# Patient Record
Sex: Female | Born: 1950 | Race: White | Hispanic: No | Marital: Married | Smoking: Former smoker
Health system: Southern US, Community
[De-identification: ages and names within clinical notes are randomized; demographics above are authoritative.]

## PROBLEM LIST (undated history)

## (undated) DIAGNOSIS — H539 Unspecified visual disturbance: Secondary | ICD-10-CM

## (undated) DIAGNOSIS — G35D Multiple sclerosis, unspecified: Secondary | ICD-10-CM

## (undated) DIAGNOSIS — E785 Hyperlipidemia, unspecified: Secondary | ICD-10-CM

## (undated) DIAGNOSIS — E039 Hypothyroidism, unspecified: Secondary | ICD-10-CM

## (undated) DIAGNOSIS — N200 Calculus of kidney: Secondary | ICD-10-CM

## (undated) DIAGNOSIS — Z9889 Other specified postprocedural states: Secondary | ICD-10-CM

## (undated) DIAGNOSIS — Z87442 Personal history of urinary calculi: Secondary | ICD-10-CM

## (undated) DIAGNOSIS — N189 Chronic kidney disease, unspecified: Secondary | ICD-10-CM

## (undated) DIAGNOSIS — G35 Multiple sclerosis: Secondary | ICD-10-CM

## (undated) DIAGNOSIS — E079 Disorder of thyroid, unspecified: Secondary | ICD-10-CM

## (undated) DIAGNOSIS — E119 Type 2 diabetes mellitus without complications: Secondary | ICD-10-CM

## (undated) DIAGNOSIS — K219 Gastro-esophageal reflux disease without esophagitis: Secondary | ICD-10-CM

## (undated) DIAGNOSIS — F32A Depression, unspecified: Secondary | ICD-10-CM

## (undated) DIAGNOSIS — L309 Dermatitis, unspecified: Secondary | ICD-10-CM

## (undated) DIAGNOSIS — K829 Disease of gallbladder, unspecified: Secondary | ICD-10-CM

## (undated) DIAGNOSIS — G43909 Migraine, unspecified, not intractable, without status migrainosus: Secondary | ICD-10-CM

## (undated) DIAGNOSIS — R600 Localized edema: Secondary | ICD-10-CM

## (undated) DIAGNOSIS — G2581 Restless legs syndrome: Secondary | ICD-10-CM

## (undated) DIAGNOSIS — R0683 Snoring: Secondary | ICD-10-CM

## (undated) DIAGNOSIS — R2681 Unsteadiness on feet: Secondary | ICD-10-CM

## (undated) DIAGNOSIS — F419 Anxiety disorder, unspecified: Secondary | ICD-10-CM

## (undated) DIAGNOSIS — M1990 Unspecified osteoarthritis, unspecified site: Secondary | ICD-10-CM

## (undated) DIAGNOSIS — R42 Dizziness and giddiness: Secondary | ICD-10-CM

## (undated) DIAGNOSIS — E7800 Pure hypercholesterolemia, unspecified: Secondary | ICD-10-CM

## (undated) DIAGNOSIS — M5431 Sciatica, right side: Secondary | ICD-10-CM

## (undated) DIAGNOSIS — M2550 Pain in unspecified joint: Secondary | ICD-10-CM

## (undated) DIAGNOSIS — J45909 Unspecified asthma, uncomplicated: Secondary | ICD-10-CM

## (undated) DIAGNOSIS — R002 Palpitations: Secondary | ICD-10-CM

## (undated) DIAGNOSIS — F329 Major depressive disorder, single episode, unspecified: Secondary | ICD-10-CM

## (undated) DIAGNOSIS — E669 Obesity, unspecified: Secondary | ICD-10-CM

## (undated) DIAGNOSIS — Z91018 Allergy to other foods: Secondary | ICD-10-CM

## (undated) DIAGNOSIS — T7840XA Allergy, unspecified, initial encounter: Secondary | ICD-10-CM

## (undated) DIAGNOSIS — R0600 Dyspnea, unspecified: Secondary | ICD-10-CM

## (undated) DIAGNOSIS — R112 Nausea with vomiting, unspecified: Secondary | ICD-10-CM

## (undated) DIAGNOSIS — M549 Dorsalgia, unspecified: Secondary | ICD-10-CM

## (undated) HISTORY — PX: NISSEN FUNDOPLICATION: SHX2091

## (undated) HISTORY — DX: Chronic kidney disease, unspecified: N18.9

## (undated) HISTORY — DX: Multiple sclerosis, unspecified: G35.D

## (undated) HISTORY — DX: Type 2 diabetes mellitus without complications: E11.9

## (undated) HISTORY — DX: Unspecified asthma, uncomplicated: J45.909

## (undated) HISTORY — DX: Calculus of kidney: N20.0

## (undated) HISTORY — DX: Pure hypercholesterolemia, unspecified: E78.00

## (undated) HISTORY — DX: Allergy to other foods: Z91.018

## (undated) HISTORY — DX: Dyspnea, unspecified: R06.00

## (undated) HISTORY — PX: ESOPHAGOGASTRODUODENOSCOPY: SHX1529

## (undated) HISTORY — DX: Obesity, unspecified: E66.9

## (undated) HISTORY — DX: Depression, unspecified: F32.A

## (undated) HISTORY — DX: Disease of gallbladder, unspecified: K82.9

## (undated) HISTORY — DX: Dorsalgia, unspecified: M54.9

## (undated) HISTORY — PX: BREAST SURGERY: SHX581

## (undated) HISTORY — PX: LITHOTRIPSY: SUR834

## (undated) HISTORY — DX: Localized edema: R60.0

## (undated) HISTORY — PX: OTHER SURGICAL HISTORY: SHX169

## (undated) HISTORY — DX: Major depressive disorder, single episode, unspecified: F32.9

## (undated) HISTORY — DX: Pain in unspecified joint: M25.50

## (undated) HISTORY — PX: FINGER SURGERY: SHX640

## (undated) HISTORY — DX: Dermatitis, unspecified: L30.9

## (undated) HISTORY — DX: Hyperlipidemia, unspecified: E78.5

## (undated) HISTORY — DX: Disorder of thyroid, unspecified: E07.9

## (undated) HISTORY — DX: Allergy, unspecified, initial encounter: T78.40XA

## (undated) HISTORY — DX: Restless legs syndrome: G25.81

## (undated) HISTORY — DX: Gastro-esophageal reflux disease without esophagitis: K21.9

## (undated) HISTORY — PX: HERNIA REPAIR: SHX51

## (undated) HISTORY — DX: Unspecified visual disturbance: H53.9

## (undated) HISTORY — DX: Migraine, unspecified, not intractable, without status migrainosus: G43.909

## (undated) HISTORY — DX: Anxiety disorder, unspecified: F41.9

## (undated) HISTORY — PX: CHOLECYSTECTOMY: SHX55

## (undated) HISTORY — DX: Sciatica, right side: M54.31

## (undated) HISTORY — DX: Multiple sclerosis: G35

---

## 1981-10-11 HISTORY — PX: DILATION AND CURETTAGE OF UTERUS: SHX78

## 1998-07-02 ENCOUNTER — Ambulatory Visit (HOSPITAL_COMMUNITY): Admission: RE | Admit: 1998-07-02 | Discharge: 1998-07-02 | Payer: Self-pay

## 1998-07-07 ENCOUNTER — Encounter (HOSPITAL_COMMUNITY): Admission: RE | Admit: 1998-07-07 | Discharge: 1998-10-05 | Payer: Self-pay

## 2000-09-23 ENCOUNTER — Encounter (INDEPENDENT_AMBULATORY_CARE_PROVIDER_SITE_OTHER): Payer: Self-pay | Admitting: Specialist

## 2000-09-23 ENCOUNTER — Other Ambulatory Visit: Admission: RE | Admit: 2000-09-23 | Discharge: 2000-09-23 | Payer: Self-pay | Admitting: Gynecology

## 2002-07-17 ENCOUNTER — Ambulatory Visit (HOSPITAL_COMMUNITY): Admission: RE | Admit: 2002-07-17 | Discharge: 2002-07-17 | Payer: Self-pay | Admitting: Gastroenterology

## 2002-11-26 ENCOUNTER — Other Ambulatory Visit: Admission: RE | Admit: 2002-11-26 | Discharge: 2002-11-26 | Payer: Self-pay | Admitting: Diagnostic Radiology

## 2003-01-24 ENCOUNTER — Emergency Department (HOSPITAL_COMMUNITY): Admission: EM | Admit: 2003-01-24 | Discharge: 2003-01-24 | Payer: Self-pay | Admitting: Emergency Medicine

## 2003-01-24 ENCOUNTER — Encounter: Payer: Self-pay | Admitting: Emergency Medicine

## 2004-07-29 ENCOUNTER — Encounter: Admission: RE | Admit: 2004-07-29 | Discharge: 2004-07-29 | Payer: Self-pay

## 2004-09-23 ENCOUNTER — Other Ambulatory Visit: Admission: RE | Admit: 2004-09-23 | Discharge: 2004-09-23 | Payer: Self-pay | Admitting: *Deleted

## 2005-09-28 ENCOUNTER — Other Ambulatory Visit: Admission: RE | Admit: 2005-09-28 | Discharge: 2005-09-28 | Payer: Self-pay | Admitting: *Deleted

## 2006-09-29 ENCOUNTER — Other Ambulatory Visit: Admission: RE | Admit: 2006-09-29 | Discharge: 2006-09-29 | Payer: Self-pay | Admitting: *Deleted

## 2007-10-11 ENCOUNTER — Other Ambulatory Visit: Admission: RE | Admit: 2007-10-11 | Discharge: 2007-10-11 | Payer: Self-pay | Admitting: Family Medicine

## 2008-01-03 DIAGNOSIS — G43909 Migraine, unspecified, not intractable, without status migrainosus: Secondary | ICD-10-CM | POA: Insufficient documentation

## 2008-01-08 DIAGNOSIS — E890 Postprocedural hypothyroidism: Secondary | ICD-10-CM

## 2008-11-20 ENCOUNTER — Other Ambulatory Visit: Admission: RE | Admit: 2008-11-20 | Discharge: 2008-11-20 | Payer: Self-pay | Admitting: Family Medicine

## 2009-01-20 ENCOUNTER — Ambulatory Visit: Payer: Self-pay | Admitting: Oncology

## 2009-01-28 LAB — CBC WITH DIFFERENTIAL/PLATELET
BASO%: 0.4 % (ref 0.0–2.0)
EOS%: 3.9 % (ref 0.0–7.0)
HCT: 37.8 % (ref 34.8–46.6)
MCH: 24.9 pg — ABNORMAL LOW (ref 25.1–34.0)
MCHC: 31.9 g/dL (ref 31.5–36.0)
NEUT%: 71.6 % (ref 38.4–76.8)
RBC: 4.86 10*6/uL (ref 3.70–5.45)
RDW: 15.9 % — ABNORMAL HIGH (ref 11.2–14.5)
lymph#: 0.8 10*3/uL — ABNORMAL LOW (ref 0.9–3.3)

## 2009-01-28 LAB — CHCC SMEAR

## 2009-01-31 LAB — SPEP & IFE WITH QIG
Alpha-1-Globulin: 5.9 % — ABNORMAL HIGH (ref 2.9–4.9)
Alpha-2-Globulin: 11.9 % — ABNORMAL HIGH (ref 7.1–11.8)
Beta 2: 5.4 % (ref 3.2–6.5)
Gamma Globulin: 12.2 % (ref 11.1–18.8)
IgG (Immunoglobin G), Serum: 905 mg/dL (ref 694–1618)
IgM, Serum: 94 mg/dL (ref 60–263)

## 2009-01-31 LAB — COMPREHENSIVE METABOLIC PANEL
ALT: 22 U/L (ref 0–35)
AST: 21 U/L (ref 0–37)
Albumin: 4.1 g/dL (ref 3.5–5.2)
Alkaline Phosphatase: 87 U/L (ref 39–117)
BUN: 13 mg/dL (ref 6–23)
Calcium: 9.1 mg/dL (ref 8.4–10.5)
Chloride: 105 mEq/L (ref 96–112)
Potassium: 4.3 mEq/L (ref 3.5–5.3)
Sodium: 139 mEq/L (ref 135–145)

## 2009-01-31 LAB — ERYTHROPOIETIN: Erythropoietin: 39.5 m[IU]/mL — ABNORMAL HIGH (ref 2.6–34.0)

## 2009-01-31 LAB — IRON AND TIBC: UIBC: 385 ug/dL

## 2009-01-31 LAB — FOLATE: Folate: 9.7 ng/mL

## 2009-02-24 ENCOUNTER — Encounter: Admission: RE | Admit: 2009-02-24 | Discharge: 2009-02-24 | Payer: Self-pay | Admitting: *Deleted

## 2009-05-02 ENCOUNTER — Ambulatory Visit: Payer: Self-pay | Admitting: Oncology

## 2009-06-09 ENCOUNTER — Ambulatory Visit: Payer: Self-pay | Admitting: Oncology

## 2009-06-11 LAB — CBC WITH DIFFERENTIAL/PLATELET
Basophils Absolute: 0 10*3/uL (ref 0.0–0.1)
Eosinophils Absolute: 0.2 10*3/uL (ref 0.0–0.5)
HGB: 10.9 g/dL — ABNORMAL LOW (ref 11.6–15.9)
MONO#: 0.7 10*3/uL (ref 0.1–0.9)
NEUT#: 3.1 10*3/uL (ref 1.5–6.5)
RBC: 4.29 10*6/uL (ref 3.70–5.45)
RDW: 16.3 % — ABNORMAL HIGH (ref 11.2–14.5)
WBC: 5.3 10*3/uL (ref 3.9–10.3)
lymph#: 1.3 10*3/uL (ref 0.9–3.3)

## 2009-06-11 LAB — IRON AND TIBC
%SAT: 7 % — ABNORMAL LOW (ref 20–55)
Iron: 31 ug/dL — ABNORMAL LOW (ref 42–145)
TIBC: 420 ug/dL (ref 250–470)
UIBC: 389 ug/dL

## 2009-06-11 LAB — FERRITIN: Ferritin: 9 ng/mL — ABNORMAL LOW (ref 10–291)

## 2009-06-18 ENCOUNTER — Emergency Department (HOSPITAL_COMMUNITY): Admission: EM | Admit: 2009-06-18 | Discharge: 2009-06-18 | Payer: Self-pay | Admitting: Emergency Medicine

## 2009-08-25 ENCOUNTER — Ambulatory Visit: Payer: Self-pay | Admitting: Oncology

## 2010-06-10 ENCOUNTER — Encounter: Admission: RE | Admit: 2010-06-10 | Discharge: 2010-06-10 | Payer: Self-pay | Admitting: Family Medicine

## 2010-11-24 ENCOUNTER — Other Ambulatory Visit: Payer: Self-pay | Admitting: Family Medicine

## 2010-11-24 ENCOUNTER — Other Ambulatory Visit (HOSPITAL_COMMUNITY)
Admission: RE | Admit: 2010-11-24 | Discharge: 2010-11-24 | Disposition: A | Discharge status: Home / Self Care | Payer: BC Managed Care – PPO | Source: Ambulatory Visit | Attending: Family Medicine | Admitting: Family Medicine

## 2010-11-24 DIAGNOSIS — Z01419 Encounter for gynecological examination (general) (routine) without abnormal findings: Secondary | ICD-10-CM | POA: Insufficient documentation

## 2011-04-16 ENCOUNTER — Other Ambulatory Visit: Payer: Self-pay | Admitting: Family Medicine

## 2011-04-16 DIAGNOSIS — E041 Nontoxic single thyroid nodule: Secondary | ICD-10-CM

## 2011-04-29 ENCOUNTER — Other Ambulatory Visit: Payer: Self-pay | Admitting: Family Medicine

## 2011-04-29 DIAGNOSIS — E041 Nontoxic single thyroid nodule: Secondary | ICD-10-CM

## 2011-05-24 ENCOUNTER — Other Ambulatory Visit: Payer: BC Managed Care – PPO

## 2011-05-25 ENCOUNTER — Ambulatory Visit
Admission: RE | Admit: 2011-05-25 | Discharge: 2011-05-25 | Disposition: A | Discharge status: Home / Self Care | Payer: BC Managed Care – PPO | Source: Ambulatory Visit | Attending: Family Medicine | Admitting: Family Medicine

## 2011-05-25 DIAGNOSIS — E041 Nontoxic single thyroid nodule: Secondary | ICD-10-CM

## 2011-10-08 ENCOUNTER — Other Ambulatory Visit: Payer: Self-pay | Admitting: Family Medicine

## 2011-10-18 ENCOUNTER — Inpatient Hospital Stay
Admission: RE | Admit: 2011-10-18 | Discharge: 2011-10-18 | Payer: BC Managed Care – PPO | Source: Ambulatory Visit | Attending: Family Medicine | Admitting: Family Medicine

## 2012-02-18 ENCOUNTER — Encounter (INDEPENDENT_AMBULATORY_CARE_PROVIDER_SITE_OTHER): Payer: Self-pay

## 2012-02-18 ENCOUNTER — Ambulatory Visit (INDEPENDENT_AMBULATORY_CARE_PROVIDER_SITE_OTHER): Payer: BC Managed Care – PPO | Admitting: General Surgery

## 2012-02-18 ENCOUNTER — Encounter (INDEPENDENT_AMBULATORY_CARE_PROVIDER_SITE_OTHER): Payer: Self-pay | Admitting: General Surgery

## 2012-02-28 ENCOUNTER — Encounter (INDEPENDENT_AMBULATORY_CARE_PROVIDER_SITE_OTHER): Payer: Self-pay | Admitting: General Surgery

## 2012-04-03 ENCOUNTER — Ambulatory Visit (INDEPENDENT_AMBULATORY_CARE_PROVIDER_SITE_OTHER): Payer: BC Managed Care – PPO | Admitting: General Surgery

## 2012-05-11 ENCOUNTER — Encounter (INDEPENDENT_AMBULATORY_CARE_PROVIDER_SITE_OTHER): Payer: Self-pay | Admitting: General Surgery

## 2012-05-11 ENCOUNTER — Ambulatory Visit (INDEPENDENT_AMBULATORY_CARE_PROVIDER_SITE_OTHER): Payer: BC Managed Care – PPO | Admitting: General Surgery

## 2012-05-11 VITALS — BP 124/68 | HR 60 | Temp 97.7°F | Resp 12 | Ht 58.5 in | Wt 205.4 lb

## 2012-05-11 DIAGNOSIS — K430 Incisional hernia with obstruction, without gangrene: Secondary | ICD-10-CM

## 2012-05-11 NOTE — Progress Notes (Signed)
Patient ID: Jocelyn Wilson, female   DOB: Dec 04, 1950, 61 y.o.   MRN: [id number]  Chief Complaint  Patient presents with  . Other    NP eval hernia    HPI Jocelyn Wilson is a 61 y.o. female.  She is referred by Dr. Ihor Dow at [facility] for evaluation of a ventral incisional hernia.  This patient underwent a major laparotomy through a midline incision in  1984 and underwent cholecystectomy and hiatal hernia repair in [place]. She states she has had a bulge just to the left of the midline for about 3-4 years. She has a little bit of pain. She's never had a bowel obstruction. She denies any other abdominal surgeries.  Colonoscopy 2010. She is interested in  a surgical opinion about this.  She has numerous comorbidities including obesity, hyperlipidemia, hypothyroidism, multiple sclerosis, restless leg syndrome, intermittent atrial tachycardia followed by cardiology, gastroesophageal disease, depression anxiety, history of kidney stones.  She has allergies and has had an anaphylactic reaction in the past to IV iron. She is very fearful of having another allergic reaction. HPI  Past Medical History  Diagnosis Date  . GERD (gastroesophageal reflux disease)   . Diverticula of colon   . Multiple sclerosis   . Obesity   . Allergy   . Hypertension   . Hyperlipidemia   . Anxiety   . Chronic kidney disease     KIDNEY STONES WITH STENTS  . Eczema   . Thyroid disease     hyperthyroidism  . Restless leg syndrome   . Hypercholesterolemia   . Depression   . Migraine headache   . Kidney stones   . Cervical radiculopathy     Past Surgical History  Procedure Date  . Cholecystectomy   . Dilation and curettage of uterus   . Lithotripsy   . Nissen fundoplication   . Finger surgery x8  . Breast surgery     AUGMENTATION    Family History  Problem Relation Age of Onset  . Cancer Mother     melanoma  . Hyperlipidemia Mother   . Glaucoma Mother   . Hypertension Mother   .  Heart disease Father   . Cancer Father     prostate  . Diabetes Father   . Cancer Brother     esophageal  . Arthritis Sister     RA    Social History History  Substance Use Topics  . Smoking status: Former Games developer  . Smokeless tobacco: Never Used  . Alcohol Use: No    Allergies  Allergen Reactions  . Iron Anaphylaxis    Iv iron  . Ciprofloxacin   . Contrast Media (Iodinated Diagnostic Agents) Itching  . Sulfa Antibiotics     Current Outpatient Prescriptions  Medication Sig Dispense Refill  . atorvastatin (LIPITOR) 20 MG tablet Take 20 mg by mouth daily.      . busPIRone (BUSPAR) 15 MG tablet Take 15 mg by mouth 3 (three) times daily.      . cholecalciferol (VITAMIN D) 1000 UNITS tablet Take 1,000 Units by mouth daily.      Marland Kitchen EPINEPHrine (EPIPEN) 0.3 mg/0.3 mL DEVI Inject 0.3 mg into the muscle once.      . Ergocalciferol (VITAMIN D2) 2000 UNITS TABS Take by mouth daily.      Marland Kitchen esomeprazole (NEXIUM) 40 MG capsule Take 40 mg by mouth daily before breakfast.      . ferrous sulfate 325 (65 FE) MG tablet Take 325 mg by mouth  daily with breakfast.      . interferon beta-1b (BETASERON) 0.3 MG injection Inject 0.25 mg into the skin every other day.      . levothyroxine (SYNTHROID, LEVOTHROID) 100 MCG tablet Take 100 mcg by mouth daily.      . metoprolol succinate (TOPROL-XL) 50 MG 24 hr tablet Take 50 mg by mouth daily. Take with or immediately following a meal.      . modafinil (PROVIGIL) 200 MG tablet Take 200 mg by mouth daily.      . potassium citrate (UROCIT-K 5) 5 MEQ (540 MG) SR tablet Take by mouth 3 (three) times daily with meals.      . pyridOXINE (VITAMIN B-6) 100 MG tablet Take 100 mg by mouth daily.      . ranitidine (ZANTAC) 150 MG capsule Take 150 mg by mouth 2 (two) times daily.      Marland Kitchen rOPINIRole (REQUIP) 0.25 MG tablet Take by mouth daily.      . sertraline (ZOLOFT) 100 MG tablet Take 150 mg by mouth daily.      . traZODone (DESYREL) 100 MG tablet Take 150 mg by  mouth at bedtime.        Review of Systems Review of Systems  Constitutional: Negative for fever, chills and unexpected weight change.  HENT: Negative for hearing loss, congestion, sore throat, trouble swallowing and voice change.   Eyes: Negative for visual disturbance.  Respiratory: Negative for cough and wheezing.   Cardiovascular: Negative for chest pain, palpitations and leg swelling.  Gastrointestinal: Positive for nausea and abdominal pain. Negative for vomiting, diarrhea, constipation, blood in stool, abdominal distention and anal bleeding.  Genitourinary: Negative for hematuria, vaginal bleeding and difficulty urinating.  Musculoskeletal: Negative for arthralgias.  Skin: Negative for rash and wound.  Neurological: Positive for weakness and light-headedness. Negative for seizures, syncope and headaches.  Hematological: Negative for adenopathy. Does not bruise/bleed easily.  Psychiatric/Behavioral: Negative for confusion.    Blood pressure 124/68, pulse 60, temperature 97.7 F (36.5 C), temperature source Temporal, resp. rate 12, height 4' 10.5" (1.486 m), weight 205 lb 6.4 oz (93.169 kg).  Physical Exam Physical Exam  Constitutional: She is oriented to person, place, and time. She appears well-developed and well-nourished. No distress.       Pleasant. Cooperative. Short stature. BMI 42.2.  HENT:  Head: Normocephalic and atraumatic.  Nose: Nose normal.  Mouth/Throat: No oropharyngeal exudate.  Eyes: Conjunctivae and EOM are normal. Pupils are equal, round, and reactive to light. Left eye exhibits no discharge. No scleral icterus.  Neck: Neck supple. No JVD present. No tracheal deviation present. No thyromegaly present.  Cardiovascular: Normal rate, regular rhythm, normal heart sounds and intact distal pulses.   No murmur heard. Pulmonary/Chest: Effort normal and breath sounds normal. No respiratory distress. She has no wheezes. She has no rales. She exhibits no tenderness.    Abdominal: Soft. Bowel sounds are normal. She exhibits no distension and no mass. There is no tenderness. There is no rebound and no guarding.       Midline scar from the xiphoid to the umbilicus.   Grapefruit sized bulge to the left of the umbilicus.Skin healthy. Not completely reducible. Obese.  Musculoskeletal: She exhibits no edema and no tenderness.  Lymphadenopathy:    She has no cervical adenopathy.  Neurological: She is alert and oriented to person, place, and time. She exhibits normal muscle tone. Coordination normal.  Skin: Skin is warm. No rash noted. She is not diaphoretic. No erythema. No  pallor.  Psychiatric: She has a normal mood and affect. Her behavior is normal. Judgment and thought content normal.    Data Reviewed Office notes from [place].  Assessment    Incarcerated ventral incisional hernia  Remote history laparotomy for cholecystectomy and hiatal hernia repair  Multiple sclerosis  Morbid obesity  History atrial tachycardia  Depression and anxiety  History anaphylactic reaction to IV iron therapy and cervical other allergies  Hypothyroidism    Plan    Will schedule for CT scan of the abdomen and pelvis. The patient declines IV and oral contrast for fear of allergic reaction. I told her this may compromise the information gained and we may or may not have to repeat the study with pretreatment for allergy. She accepts this. She strongly declines any form of contrast.  We will arrange  for cardiac clearance with Dr. Eldridge Dace., her cardiologist  Return to see me after the CT scan. We will discuss whether we can repair this laparoscopically or if it will have  to be done open. She will have to have mesh implanted. We discussed the surgical techniques in some detail.       Angelia Mould. Derrell Lolling, M.D., [facility] General and Minimally invasive Surgery Breast and Colorectal Surgery Office:   [phone] Pager:    [phone]  05/11/2012, 1:01 PM

## 2012-05-11 NOTE — Patient Instructions (Signed)
Your physical exam today strongly suggests that you have a ventral incisional hernia just to the left of the midline at the lower end of your incision. I cannot completely push this back in.  You will be scheduled for a CT scan of her abdomen to evaluate this, and then you will return to see me to discuss your surgical options.

## 2012-05-15 ENCOUNTER — Ambulatory Visit
Admission: RE | Admit: 2012-05-15 | Discharge: 2012-05-15 | Disposition: A | Discharge status: Home / Self Care | Payer: BC Managed Care – PPO | Source: Ambulatory Visit | Attending: General Surgery | Admitting: General Surgery

## 2012-05-15 ENCOUNTER — Inpatient Hospital Stay: Admission: RE | Admit: 2012-05-15 | Payer: BC Managed Care – PPO | Source: Ambulatory Visit

## 2012-05-15 DIAGNOSIS — K430 Incisional hernia with obstruction, without gangrene: Secondary | ICD-10-CM

## 2012-05-25 ENCOUNTER — Telehealth (INDEPENDENT_AMBULATORY_CARE_PROVIDER_SITE_OTHER): Payer: Self-pay | Admitting: General Surgery

## 2012-05-25 NOTE — Telephone Encounter (Signed)
Called back patient who was concerned because Dr. Hoyle Barr office did not tell her they set her up with an appointment to be seen for the cardiac clearance that was submitted at Dr. Jacinto Halim request on 05/11/12. Patient stated she found out from her pcp. Advised her that we have no access to Dr. Hoyle Barr schedule and it was up to his office to actually contact her to make her aware of the appointment. Patient stated that she has not agreed to have the surgery and I advised her the request for clearance was submitted so that if cleared by cardiologist there would be nothing holding up the scheduling of the surgery.

## 2012-06-15 ENCOUNTER — Encounter (INDEPENDENT_AMBULATORY_CARE_PROVIDER_SITE_OTHER): Payer: Self-pay | Admitting: General Surgery

## 2012-06-15 ENCOUNTER — Ambulatory Visit (INDEPENDENT_AMBULATORY_CARE_PROVIDER_SITE_OTHER): Payer: BC Managed Care – PPO | Admitting: General Surgery

## 2012-06-15 VITALS — BP 150/92 | HR 80 | Temp 97.2°F | Resp 18 | Ht <= 58 in | Wt 206.0 lb

## 2012-06-15 DIAGNOSIS — K430 Incisional hernia with obstruction, without gangrene: Secondary | ICD-10-CM

## 2012-06-15 NOTE — Progress Notes (Signed)
Patient ID: Jocelyn Wilson, female   DOB: April 21, 1951, 61 y.o.   MRN: [id number]  Chief Complaint  Patient presents with  . Follow-up    discuss test results    HPI Jocelyn Wilson is a 61 y.o. female.  She returns following CT scan to discuss the CT findings and to discuss management options for her symptomatic ventral hernias. e. She was initially referred by Dr. Ihor Dow at [facility] for evaluation of a ventral incisional hernia.  This patient underwent a major laparotomy through a midline incision in 1984 and underwent cholecystectomy and hiatal hernia repair in [place]. She states she has had a bulge just to the left of the midline for about 3-4 years. She has some of pain and a  bulge.   . She's never had a bowel obstruction. She denies any other abdominal surgeries.  Colonoscopy 2010.Marland Kitchen   Recent CT scan shows multiple incisional hernia defects containing fatty tissue. These are all of from just above the umbilicus and up the midline. There is one large, one medium size and one tiny hernia. No other hernias are noted. There is no entrapped intestine or other abnormality.  We talked along from about what would be involved in repairing his hernia. Hopefully this can be done laparoscopically, assuming we can reduce the omentum. There is a significant probability that we may have to convert this to an open operation because of the incarceration. We discussed numerous risks, including bleeding, infection, recurrence of the hernia, injury to the intestine, cardiac and pulmonary complications. She will need to be in the hospital several days and she will be out of work for a month. We answered lots and lots of questions that she had. Her husband is with her today to participate in the conversation. She wants to have the surgery because of the pain and bulge. She understands the natural history of the hernia is to slowly progress and possibly involve the intestine later in life.  She has an  appointment with Dr. Eldridge Dace Sept. 10 for cardiac clearance.  She has numerous comorbidities including obesity, hyperlipidemia, hypothyroidism, multiple sclerosis, restless leg syndrome, intermittent atrial tachycardia followed by cardiology, gastroesophageal disease, depression anxiety, history of kidney stones.  She has allergies and has had an anaphylactic reaction in the past to IV iron. She is very fearful of having another allergic reaction.   HPI  Past Medical History  Diagnosis Date  . GERD (gastroesophageal reflux disease)   . Diverticula of colon   . Multiple sclerosis   . Obesity   . Allergy   . Hypertension   . Hyperlipidemia   . Anxiety   . Chronic kidney disease     KIDNEY STONES WITH STENTS  . Eczema   . Thyroid disease     hyperthyroidism  . Restless leg syndrome   . Hypercholesterolemia   . Depression   . Migraine headache   . Kidney stones   . Cervical radiculopathy     Past Surgical History  Procedure Date  . Cholecystectomy   . Dilation and curettage of uterus   . Lithotripsy   . Nissen fundoplication   . Finger surgery x8  . Breast surgery     AUGMENTATION    Family History  Problem Relation Age of Onset  . Cancer Mother     melanoma  . Hyperlipidemia Mother   . Glaucoma Mother   . Hypertension Mother   . Heart disease Father   . Cancer Father  prostate  . Diabetes Father   . Cancer Brother     esophageal  . Arthritis Sister     RA    Social History History  Substance Use Topics  . Smoking status: Former Games developer  . Smokeless tobacco: Never Used  . Alcohol Use: No    Allergies  Allergen Reactions  . Iron Anaphylaxis    Iv iron  . Ciprofloxacin   . Contrast Media (Iodinated Diagnostic Agents) Itching  . Sulfa Antibiotics     Current Outpatient Prescriptions  Medication Sig Dispense Refill  . atorvastatin (LIPITOR) 20 MG tablet Take 20 mg by mouth daily.      . busPIRone (BUSPAR) 15 MG tablet Take 15 mg by mouth 3  (three) times daily.      . cholecalciferol (VITAMIN D) 1000 UNITS tablet Take 1,000 Units by mouth daily.      Marland Kitchen EPINEPHrine (EPIPEN) 0.3 mg/0.3 mL DEVI Inject 0.3 mg into the muscle once.      . Ergocalciferol (VITAMIN D2) 2000 UNITS TABS Take by mouth daily.      Marland Kitchen esomeprazole (NEXIUM) 40 MG capsule Take 40 mg by mouth daily before breakfast.      . ferrous sulfate 325 (65 FE) MG tablet Take 325 mg by mouth daily with breakfast.      . interferon beta-1b (BETASERON) 0.3 MG injection Inject 0.25 mg into the skin every other day.      . levothyroxine (SYNTHROID, LEVOTHROID) 100 MCG tablet Take 100 mcg by mouth daily.      . metoprolol succinate (TOPROL-XL) 50 MG 24 hr tablet Take 50 mg by mouth daily. Take with or immediately following a meal.      . modafinil (PROVIGIL) 200 MG tablet Take 200 mg by mouth daily.      . potassium citrate (UROCIT-K 5) 5 MEQ (540 MG) SR tablet Take by mouth 3 (three) times daily with meals.      . pyridOXINE (VITAMIN B-6) 100 MG tablet Take 100 mg by mouth daily.      . ranitidine (ZANTAC) 150 MG capsule Take 150 mg by mouth 2 (two) times daily.      Marland Kitchen rOPINIRole (REQUIP) 0.25 MG tablet Take by mouth daily.      . sertraline (ZOLOFT) 100 MG tablet Take 150 mg by mouth daily.      . traZODone (DESYREL) 100 MG tablet Take 150 mg by mouth at bedtime.        Review of Systems Review of Systems  Constitutional: Negative for fever, chills and unexpected weight change.  HENT: Negative for hearing loss, congestion, sore throat, trouble swallowing and voice change.   Eyes: Negative for visual disturbance.  Respiratory: Negative for cough and wheezing.   Cardiovascular: Negative for chest pain, palpitations and leg swelling.  Gastrointestinal: Positive for abdominal pain and abdominal distention. Negative for nausea, vomiting, diarrhea, constipation, blood in stool and anal bleeding.  Genitourinary: Negative for hematuria, vaginal bleeding and difficulty urinating.    Musculoskeletal: Negative for arthralgias.  Skin: Negative for rash and wound.  Neurological: Negative for seizures, syncope and headaches.  Hematological: Negative for adenopathy. Does not bruise/bleed easily.  Psychiatric/Behavioral: Negative for confusion. The patient is nervous/anxious.     Blood pressure 150/92, pulse 80, temperature 97.2 F (36.2 C), resp. rate 18, height 4\' 10"  (1.473 m), weight 206 lb (93.441 kg).  Physical Exam Physical Exam  Constitutional: She is oriented to person, place, and time. She appears well-developed and well-nourished. No distress.  HENT:  Head: Normocephalic and atraumatic.  Nose: Nose normal.  Mouth/Throat: No oropharyngeal exudate.  Cardiovascular: Normal rate, regular rhythm, normal heart sounds and intact distal pulses.   No murmur heard. Pulmonary/Chest: Effort normal and breath sounds normal. No respiratory distress. She has no wheezes. She has no rales. She exhibits no tenderness.  Abdominal: Soft. Bowel sounds are normal. She exhibits no distension and no mass. There is no tenderness. There is no rebound and no guarding.       Abdomen obese. BMI 45.5. Upper midline scar healed. 2 hernia bulges, a larger one just above the umbilicus particularly left and a smaller one above that. Partially reducible when supine but not completely reducible. Skin healthy.  Musculoskeletal: She exhibits no edema and no tenderness.  Neurological: She is alert and oriented to person, place, and time. She exhibits normal muscle tone. Coordination normal.  Skin: Skin is warm. No rash noted. She is not diaphoretic. No erythema. No pallor.  Psychiatric: She has a normal mood and affect. Her behavior is normal. Judgment and thought content normal.    Data Reviewed CT scan.  Assessment    Multiple incarcerated ventral incisional hernias. Symptomatic. Selective repair is recommended.  Morbid obesity  Anxiety and depression on multiple psychotropic  drugs.  History atrial tachycardia  History open cholecystectomy and hiatal hernia repair  Multiple sclerosis  GERD    Plan    She has an appointment with Dr. Everette Rank, her cardiologist, for cardiac clearance of September 10.  She has expressed a desire to have this surgery performed sometime in the next 2-3 months. We will schedule her for this at her convenience once we received cardiac clearance  She is advised to lose weight.  She is advised to take MiraLAX twice a day for 48 hours prior to the surgery  I discussed the indications, details, techniques, and numerous risks of the surgery with her and her husband. Her questions were answered. She understands these issues. She agrees with this plan.       Angelia Mould. Derrell Lolling, M.D., [facility] General and Minimally invasive Surgery Breast and Colorectal Surgery Office:   [phone] Pager:   [phone]  06/15/2012, 9:11 AM

## 2012-06-15 NOTE — Patient Instructions (Signed)
You have multiple incisional hernias in your abdominal wall, above the umbilicus. These contained fatty tissue that is only partially reducible.  We will attempt to repair your hernias laparoscopically using mesh, but it is possible we may have to do an open repair because of the nonreducible fatty tissue.  You will need to be in the hospital for a few days following the surgery, and you will not be able to play sports or do any heavy lifting for 6 weeks. You will probably be out of work for 4 weeks.   Hernia, Surgical Repair A hernia occurs when an internal organ pushes out through a weak spot in the belly (abdominal) wall muscles. Hernias commonly occur in the groin and around the navel. Hernias often can be pushed back into place (reduced). Most hernias tend to get worse over time. Problems occur when abdominal contents get stuck in the opening (incarcerated hernia). The blood supply gets cut off (strangulated hernia). This is an emergency and needs surgery. Otherwise, hernia repair can be an elective procedure. This means you can schedule this at your convenience when an emergency is not present. Because complications can occur, if you decide to repair the hernia, it is best to do it soon. When it becomes an emergency procedure, there is increased risk of complications after surgery. CAUSES   Heavy lifting.   Obesity.   Prolonged coughing.   Straining to move your bowels.   Hernias can also occur through a cut (incision) by a surgeonafter an abdominal operation.  HOME CARE INSTRUCTIONS Before the repair:  Bed rest is not required. You may continue your normal activities, but avoid heavy lifting (more than 10 pounds) or straining. Cough gently. If you are a smoker, it is best to stop. Even the best hernia repair can break down with the continual strain of coughing.   Do not wear anything tight over your hernia. Do not try to keep it in with an outside bandage or truss. These can damage  abdominal contents if they are trapped in the hernia sac.   Eat a normal diet. Avoid constipation. Straining over long periods of time to have a bowel movement will increase hernia size. It also can breakdown repairs. If you cannot do this with diet alone, laxatives or stool softeners may be used.  PRIOR TO SURGERY, SEEK IMMEDIATE MEDICAL CARE IF: You have problems (symptoms) of a trapped (incarcerated) hernia. Symptoms include:  An oral temperature above 102 F (38.9 C) develops, or as your caregiver suggests.   Increasing abdominal pain.   Feeling sick to your stomach(nausea) and vomiting.   You stop passing gas or stool.   The hernia is stuck outside the abdomen, looks discolored, feels hard, or is tender.   You have any changes in your bowel habits or in the hernia that is unusual for you.  LET YOUR CAREGIVERS KNOW ABOUT THE FOLLOWING:  Allergies.   Medications taken including herbs, eye drops, over the counter medications, and creams.   Use of steroids (by mouth or creams).   Family or personal history of problems with anesthetics or Novocaine.   Possibility of pregnancy, if this applies.   Personal history of blood clots (thrombophlebitis).   Family or personal history of bleeding or blood problems.   Previous surgery.   Other health problems.  BEFORE THE PROCEDURE You should be present 1 hour prior to your procedure, or as directed by your caregiver.  AFTER THE PROCEDURE After surgery, you will be taken to  the recovery area. A nurse will watch and check your progress there. Once you are awake, stable, and taking fluids well, you will be allowed to go home as long as there are no problems. Once home, an ice pack (wrapped in a light towel) applied to your operative site may help with discomfort. It may also keep the swelling down. Do not lift anything heavier than 10 pounds (4.55 kilograms). Take showers not baths. Do not drive while taking narcotics. Follow instructions  as suggested by your caregiver.  SEEK IMMEDIATE MEDICAL CARE IF: After surgery:  There is redness, swelling, or increasing pain in the wound.   There is pus coming from the wound.   There is drainage from a wound lasting longer than 1 day.   An unexplained oral temperature above 102 F (38.9 C) develops.   You notice a foul smell coming from the wound or dressing.   There is a breaking open of a wound (edged not staying together) after the sutures have been removed.   You notice increasing pain in the shoulders (shoulder strap areas).   You develop dizzy episodes or fainting while standing.   You develop persistent nausea or vomiting.   You develop a rash.   You have difficulty breathing.   You develop any reaction or side effects to medications given.  MAKE SURE YOU:   Understand these instructions.   Will watch your condition.   Will get help right away if you are not doing well or get worse.  Document Released: 03/23/2001 Document Revised: 09/16/2011 Document Reviewed: 02/13/2008 [facility] Patient Information 2012 [place], [place].

## 2012-06-23 ENCOUNTER — Encounter (INDEPENDENT_AMBULATORY_CARE_PROVIDER_SITE_OTHER): Payer: Self-pay | Admitting: General Surgery

## 2012-06-23 NOTE — Progress Notes (Signed)
Cardiac clearance received from [facility] Dr. Eldridge Dace, pulled from imaging on 06/23/12. Sent to surgery schedulers to set up surgery for patient with written orders.

## 2012-06-26 ENCOUNTER — Telehealth (INDEPENDENT_AMBULATORY_CARE_PROVIDER_SITE_OTHER): Payer: Self-pay

## 2012-06-26 NOTE — Telephone Encounter (Signed)
Patient called office to see if it's possible to get a piece of mesh and tape to her skin for a few day's to see if she has an allergic reaction to it.  Patient scheduled for hernia repair on 07/25/2012.

## 2012-06-28 ENCOUNTER — Telehealth (INDEPENDENT_AMBULATORY_CARE_PROVIDER_SITE_OTHER): Payer: Self-pay

## 2012-06-28 NOTE — Telephone Encounter (Signed)
Spoke to Ms. Jocelyn Wilson regarding her request for a piece mesh to take home and tape to her skin to see if an allergic reaction occurs.  We do not have mesh here in the office.  Patient would like to speak with Dr. Derrell Lolling to discuss her concerns and possible alternatives to having mesh placed.

## 2012-06-29 ENCOUNTER — Telehealth (INDEPENDENT_AMBULATORY_CARE_PROVIDER_SITE_OTHER): Payer: Self-pay | Admitting: General Surgery

## 2012-06-29 NOTE — Telephone Encounter (Signed)
I called Jocelyn Wilson this morning at her request. She is concerned about whether she will be allergic to the mesh. She has had an anaphylactic reaction to intravenous iron, and had a localized rash from the sticky pads when she got her EKG recently. She has had no allergic reactions to any type of clothing  or undergarment, and since they contain lots of synthetic materials I think that the chance of allergic reaction to polypropylene or polyester would be unlikely.  She also requested a private room. I asked her to remind me of that request the day of surgery. She states that she snores loudly. Most likely she will be transferred to 6 N post op.    Angelia Mould. Derrell Lolling, M.D., [facility] General and Minimally invasive Surgery Breast and Colorectal Surgery Office:   [phone] Pager:   [phone]

## 2012-07-10 ENCOUNTER — Encounter (HOSPITAL_COMMUNITY): Payer: Self-pay | Admitting: Pharmacy Technician

## 2012-07-19 NOTE — Pre-Procedure Instructions (Signed)
20 Jocelyn Wilson  07/19/2012   Your procedure is scheduled on:  Tuesday July 25, 2012  Report to [facility] at 5:30 AM.  Call this number if you have problems the morning of surgery: [phone]   Remember:   Do not eat food or drink :After Midnight.      Take these medicines the morning of surgery with A SIP OF WATER: buspirone, nexium, levothyroxine, metoprolol, zantac, zoloft, requip   Do not wear jewelry, make-up or nail polish.  Do not wear lotions, powders, or perfumes.  Do not shave 48 hours prior to surgery. Men may shave face and neck.  Do not bring valuables to the hospital.  Contacts, dentures or bridgework may not be worn into surgery.  Leave suitcase in the car. After surgery it may be brought to your room.  For patients admitted to the hospital, checkout time is 11:00 AM the day of discharge.   Patients discharged the day of surgery will not be allowed to drive home.  Name and phone number of your driver: family / friend  Special Instructions: Shower using CHG 2 nights before surgery and the night before surgery.  If you shower the day of surgery use CHG.  Use special wash - you have one bottle of CHG for all showers.  You should use approximately 1/3 of the bottle for each shower.   Please read over the following fact sheets that you were given: Pain Booklet, Coughing and Deep Breathing, MRSA Information and Surgical Site Infection Prevention

## 2012-07-20 ENCOUNTER — Encounter (HOSPITAL_COMMUNITY)
Admission: RE | Admit: 2012-07-20 | Discharge: 2012-07-20 | Disposition: A | Discharge status: Home / Self Care | Payer: BC Managed Care – PPO | Source: Ambulatory Visit | Attending: General Surgery | Admitting: General Surgery

## 2012-07-20 ENCOUNTER — Encounter (HOSPITAL_COMMUNITY): Payer: Self-pay

## 2012-07-20 HISTORY — DX: Nausea with vomiting, unspecified: Z98.890

## 2012-07-20 HISTORY — DX: Hypothyroidism, unspecified: E03.9

## 2012-07-20 HISTORY — DX: Unsteadiness on feet: R26.81

## 2012-07-20 HISTORY — DX: Palpitations: R00.2

## 2012-07-20 HISTORY — DX: Snoring: R06.83

## 2012-07-20 HISTORY — DX: Dizziness and giddiness: R42

## 2012-07-20 HISTORY — DX: Nausea with vomiting, unspecified: R11.2

## 2012-07-20 LAB — CBC WITH DIFFERENTIAL/PLATELET
Basophils Absolute: 0 10*3/uL (ref 0.0–0.1)
Basophils Relative: 0 % (ref 0–1)
Eosinophils Absolute: 0.2 10*3/uL (ref 0.0–0.7)
Eosinophils Relative: 3 % (ref 0–5)
Lymphs Abs: 1.3 10*3/uL (ref 0.7–4.0)
MCH: 29.4 pg (ref 26.0–34.0)
MCHC: 31.7 g/dL (ref 30.0–36.0)
MCV: 92.5 fL (ref 78.0–100.0)
Neutrophils Relative %: 71 % (ref 43–77)
Platelets: 209 10*3/uL (ref 150–400)
RDW: 13.9 % (ref 11.5–15.5)

## 2012-07-20 LAB — URINE MICROSCOPIC-ADD ON

## 2012-07-20 LAB — COMPREHENSIVE METABOLIC PANEL
ALT: 17 U/L (ref 0–35)
Albumin: 3.8 g/dL (ref 3.5–5.2)
Alkaline Phosphatase: 86 U/L (ref 39–117)
Calcium: 9.5 mg/dL (ref 8.4–10.5)
GFR calc Af Amer: >90 mL/min (ref >90)
Glucose, Bld: 128 mg/dL — ABNORMAL HIGH (ref 70–99)
Potassium: 4.1 mEq/L (ref 3.5–5.1)
Sodium: 141 mEq/L (ref 135–145)
Total Protein: 7.2 g/dL (ref 6.0–8.3)

## 2012-07-20 LAB — URINALYSIS, ROUTINE W REFLEX MICROSCOPIC
Bilirubin Urine: NEGATIVE
Glucose, UA: NEGATIVE mg/dL
Nitrite: NEGATIVE
Specific Gravity, Urine: 1.024 (ref 1.005–1.030)
pH: 7.5 (ref 5.0–8.0)

## 2012-07-20 LAB — SURGICAL PCR SCREEN: MRSA, PCR: NEGATIVE

## 2012-07-20 NOTE — Progress Notes (Signed)
Primary Physician - Deboraha Sprang Physician - Dr. Ihor Dow and Dr. Epimenio Foot Cardiologist - Dr. Eldridge Dace EKG September 2013 - will request and Echo maybe 5 years ago - will request

## 2012-07-24 MED ORDER — DEXTROSE 5 % IV SOLN
3.0000 g | INTRAVENOUS | Status: AC
  Administered 2012-07-25: 3 g via INTRAVENOUS
  Filled 2012-07-24: qty 3000

## 2012-07-24 MED ORDER — HEPARIN SODIUM (PORCINE) 5000 UNIT/ML IJ SOLN
5000.0000 [IU] | Freq: Once | INTRAMUSCULAR | Status: AC
  Administered 2012-07-25: 5000 [IU] via SUBCUTANEOUS
  Filled 2012-07-24: qty 1

## 2012-07-24 MED ORDER — CHLORHEXIDINE GLUCONATE 4 % EX LIQD: 1.0000 "application " | Freq: Once | CUTANEOUS | Status: DC

## 2012-07-24 NOTE — H&P (Signed)
Jocelyn Wilson    MRN: [id number]   Description: 61 year old female  Provider: Ernestene Mention, MD  Department: [facility]       Diagnoses     Incisional hernia with obstruction   - Primary    552.21        Vitals     BP Pulse Temp Resp Ht Wt    150/92 80 97.2 F (36.2 C) 18 4\' 10"  (1.473 m) 206 lb (93.441 kg)    BMI - 43.05 kg/m2                History and Physical     Ernestene Mention, MD   Patient ID: Jocelyn Wilson, female   DOB: 30-Sep-1951, 61 y.o.   MRN: [id number]                 HPI Jocelyn Wilson is a 61 y.o. female.  She returns following CT scan to discuss the CT findings and to discuss management options for her symptomatic ventral hernias.  She was initially referred by Dr. Ihor Dow at [facility] for evaluation of a ventral incisional hernia.   This patient underwent a major laparotomy through a midline incision in 1984 and underwent cholecystectomy and hiatal hernia repair in [place]. She states she has had a bulge just to the left of the midline for about 3-4 years. She has some of pain and a bulge.   . She's never had a bowel obstruction. She denies any other abdominal surgeries.   Colonoscopy 2010.Marland Kitchen    Recent CT scan shows multiple incisional hernia defects containing fatty tissue. These are all of from just above the umbilicus and up the midline. There is one large, one medium size and one tiny hernia. No other hernias are noted. There is no entrapped intestine or other abnormality.   We talked along from about what would be involved in repairing his hernia. Hopefully this can be done laparoscopically, assuming we can reduce the omentum. There is a significant probability that we may have to convert this to an open operation because of the incarceration. We discussed numerous risks, including bleeding, infection, recurrence of the hernia, injury to the intestine, cardiac and pulmonary complications. She will need to be in the  hospital several days and she will be out of work for a month. We answered lots and lots of questions that she had. Her husband is with her today to participate in the conversation. She wants to have the surgery because of the pain and bulge. She understands the natural history of the hernia is to slowly progress and possibly involve the intestine later in life.   She has an appointment with Dr. Eldridge Dace Sept. 10 for cardiac clearance.   She has numerous comorbidities including obesity, hyperlipidemia, hypothyroidism, multiple sclerosis, restless leg syndrome, intermittent atrial tachycardia followed by cardiology, gastroesophageal disease, depression anxiety, history of kidney stones.   She has allergies and has had an anaphylactic reaction in the past to IV iron. She is very fearful of having another allergic reaction.         Past Medical History   Diagnosis  Date   .  GERD (gastroesophageal reflux disease)     .  Diverticula of colon     .  Multiple sclerosis     .  Obesity     .  Allergy     .  Hypertension     .  Hyperlipidemia     .  Anxiety     .  Chronic kidney disease         KIDNEY STONES WITH STENTS   .  Eczema     .  Thyroid disease         hyperthyroidism   .  Restless leg syndrome     .  Hypercholesterolemia     .  Depression     .  Migraine headache     .  Kidney stones     .  Cervical radiculopathy         Past Surgical History   Procedure  Date   .  Cholecystectomy     .  Dilation and curettage of uterus     .  Lithotripsy     .  Nissen fundoplication     .  Finger surgery  x8   .  Breast surgery         AUGMENTATION       Family History   Problem  Relation  Age of Onset   .  Cancer  Mother         melanoma   .  Hyperlipidemia  Mother     .  Glaucoma  Mother     .  Hypertension  Mother     .  Heart disease  Father     .  Cancer  Father         prostate   .  Diabetes  Father     .  Cancer  Brother         esophageal   .  Arthritis  Sister          RA      Social History History   Substance Use Topics   .  Smoking status:  Former Games developer   .  Smokeless tobacco:  Never Used   .  Alcohol Use:  No       Allergies   Allergen  Reactions   .  Iron  Anaphylaxis       Iv iron   .  Ciprofloxacin     .  Contrast Media (Iodinated Diagnostic Agents)  Itching   .  Sulfa Antibiotics         Current Outpatient Prescriptions   Medication  Sig  Dispense  Refill   .  atorvastatin (LIPITOR) 20 MG tablet  Take 20 mg by mouth daily.         .  busPIRone (BUSPAR) 15 MG tablet  Take 15 mg by mouth 3 (three) times daily.         .  cholecalciferol (VITAMIN D) 1000 UNITS tablet  Take 1,000 Units by mouth daily.         Marland Kitchen  EPINEPHrine (EPIPEN) 0.3 mg/0.3 mL DEVI  Inject 0.3 mg into the muscle once.         .  Ergocalciferol (VITAMIN D2) 2000 UNITS TABS  Take by mouth daily.         Marland Kitchen  esomeprazole (NEXIUM) 40 MG capsule  Take 40 mg by mouth daily before breakfast.         .  ferrous sulfate 325 (65 FE) MG tablet  Take 325 mg by mouth daily with breakfast.         .  interferon beta-1b (BETASERON) 0.3 MG injection  Inject 0.25 mg into the skin every other day.         .  levothyroxine (SYNTHROID, LEVOTHROID) 100 MCG tablet  Take 100 mcg by mouth daily.         .  metoprolol succinate (TOPROL-XL) 50 MG 24 hr tablet  Take 50 mg by mouth daily. Take with or immediately following a meal.         .  modafinil (PROVIGIL) 200 MG tablet  Take 200 mg by mouth daily.         .  potassium citrate (UROCIT-K 5) 5 MEQ (540 MG) SR tablet  Take by mouth 3 (three) times daily with meals.         .  pyridOXINE (VITAMIN B-6) 100 MG tablet  Take 100 mg by mouth daily.         .  ranitidine (ZANTAC) 150 MG capsule  Take 150 mg by mouth 2 (two) times daily.         Marland Kitchen  rOPINIRole (REQUIP) 0.25 MG tablet  Take by mouth daily.         .  sertraline (ZOLOFT) 100 MG tablet  Take 150 mg by mouth daily.         .  traZODone (DESYREL) 100 MG tablet  Take 150 mg by mouth  at bedtime.            Review of Systems   Constitutional: Negative for fever, chills and unexpected weight change.  HENT: Negative for hearing loss, congestion, sore throat, trouble swallowing and voice change.   Eyes: Negative for visual disturbance.  Respiratory: Negative for cough and wheezing.   Cardiovascular: Negative for chest pain, palpitations and leg swelling.  Gastrointestinal: Positive for abdominal pain and abdominal distention. Negative for nausea, vomiting, diarrhea, constipation, blood in stool and anal bleeding.  Genitourinary: Negative for hematuria, vaginal bleeding and difficulty urinating.  Musculoskeletal: Negative for arthralgias.  Skin: Negative for rash and wound.  Neurological: Negative for seizures, syncope and headaches.  Hematological: Negative for adenopathy. Does not bruise/bleed easily.  Psychiatric/Behavioral: Negative for confusion. The patient is nervous/anxious.     Blood pressure 150/92, pulse 80, temperature 97.2 F (36.2 C), resp. rate 18, height 4\' 10"  (1.473 m), weight 206 lb (93.441 kg).   Physical Exam   Constitutional: She is oriented to person, place, and time. She appears well-developed and well-nourished. No distress.  HENT:   Head: Normocephalic and atraumatic.   Nose: Nose normal.   Mouth/Throat: No oropharyngeal exudate.  Cardiovascular: Normal rate, regular rhythm, normal heart sounds and intact distal pulses.    No murmur heard. Pulmonary/Chest: Effort normal and breath sounds normal. No respiratory distress. She has no wheezes. She has no rales. She exhibits no tenderness.  Abdominal: Soft. Bowel sounds are normal. She exhibits no distension and no mass. There is no tenderness. There is no rebound and no guarding.       Abdomen obese. BMI 45.5. Upper midline scar healed. 2 hernia bulges, a larger one just above the umbilicus particularly left and a smaller one above that. Partially reducible when supine but not completely  reducible. Skin healthy.  Musculoskeletal: She exhibits no edema and no tenderness.  Neurological: She is alert and oriented to person, place, and time. She exhibits normal muscle tone. Coordination normal.  Skin: Skin is warm. No rash noted. She is not diaphoretic. No erythema. No pallor.  Psychiatric: She has a normal mood and affect. Her behavior is normal. Judgment and thought content normal.    Data Reviewed CT scan.   Assessment Multiple incarcerated ventral incisional hernias. Symptomatic. Selective repair is recommended.   Morbid obesity  Anxiety and depression on multiple psychotropic drugs.   History atrial tachycardia   History open cholecystectomy and hiatal hernia repair   Multiple sclerosis   GERD   Plan She has an appointment with Dr. Everette Rank, her cardiologist, for cardiac clearance of September 10.   We will schedule her for this at her convenience once we received cardiac clearance   She is advised to lose weight.   She is advised to take MiraLAX twice a day for 48 hours prior to the surgery   I discussed the indications, details, techniques, and numerous risks of the surgery with her and her husband. Her questions were answered. She understands these issues. She agrees with this plan.       Angelia Mould. Derrell Lolling, M.D., [facility] General and Minimally invasive Surgery Breast and Colorectal Surgery Office:   [phone] Pager:   [phone]

## 2012-07-25 ENCOUNTER — Inpatient Hospital Stay (HOSPITAL_COMMUNITY)
Admission: RE | Admit: 2012-07-25 | Discharge: 2012-07-29 | DRG: 150 | Disposition: A | Discharge status: Home / Self Care | Payer: BC Managed Care – PPO | Source: Ambulatory Visit | Attending: General Surgery | Admitting: General Surgery

## 2012-07-25 ENCOUNTER — Encounter (HOSPITAL_COMMUNITY): Payer: Self-pay | Admitting: *Deleted

## 2012-07-25 ENCOUNTER — Encounter (HOSPITAL_COMMUNITY)
Admission: RE | Disposition: A | Discharge status: Home / Self Care | Payer: Self-pay | Source: Ambulatory Visit | Attending: General Surgery

## 2012-07-25 ENCOUNTER — Encounter (HOSPITAL_COMMUNITY): Payer: Self-pay | Admitting: Anesthesiology

## 2012-07-25 ENCOUNTER — Ambulatory Visit (HOSPITAL_COMMUNITY): Payer: BC Managed Care – PPO | Admitting: Anesthesiology

## 2012-07-25 DIAGNOSIS — F411 Generalized anxiety disorder: Secondary | ICD-10-CM | POA: Diagnosis present

## 2012-07-25 DIAGNOSIS — E039 Hypothyroidism, unspecified: Secondary | ICD-10-CM | POA: Diagnosis present

## 2012-07-25 DIAGNOSIS — K219 Gastro-esophageal reflux disease without esophagitis: Secondary | ICD-10-CM | POA: Diagnosis present

## 2012-07-25 DIAGNOSIS — K430 Incisional hernia with obstruction, without gangrene: Secondary | ICD-10-CM

## 2012-07-25 DIAGNOSIS — G2581 Restless legs syndrome: Secondary | ICD-10-CM | POA: Diagnosis present

## 2012-07-25 DIAGNOSIS — Z91041 Radiographic dye allergy status: Secondary | ICD-10-CM

## 2012-07-25 DIAGNOSIS — Z87891 Personal history of nicotine dependence: Secondary | ICD-10-CM

## 2012-07-25 DIAGNOSIS — G35 Multiple sclerosis: Secondary | ICD-10-CM | POA: Diagnosis present

## 2012-07-25 DIAGNOSIS — Z9109 Other allergy status, other than to drugs and biological substances: Secondary | ICD-10-CM

## 2012-07-25 DIAGNOSIS — Z882 Allergy status to sulfonamides status: Secondary | ICD-10-CM

## 2012-07-25 DIAGNOSIS — Z881 Allergy status to other antibiotic agents status: Secondary | ICD-10-CM

## 2012-07-25 DIAGNOSIS — Z6841 Body Mass Index (BMI) 40.0 and over, adult: Secondary | ICD-10-CM

## 2012-07-25 DIAGNOSIS — K560 Paralytic ileus: Secondary | ICD-10-CM | POA: Diagnosis not present

## 2012-07-25 DIAGNOSIS — F329 Major depressive disorder, single episode, unspecified: Secondary | ICD-10-CM | POA: Diagnosis present

## 2012-07-25 DIAGNOSIS — F3289 Other specified depressive episodes: Secondary | ICD-10-CM | POA: Diagnosis present

## 2012-07-25 DIAGNOSIS — E785 Hyperlipidemia, unspecified: Secondary | ICD-10-CM | POA: Diagnosis present

## 2012-07-25 HISTORY — PX: VENTRAL HERNIA REPAIR: SHX424

## 2012-07-25 LAB — CREATININE, SERUM: GFR calc Af Amer: >90 mL/min (ref >90)

## 2012-07-25 LAB — CBC
HCT: 36.7 % (ref 36.0–46.0)
MCV: 92.7 fL (ref 78.0–100.0)
RBC: 3.96 MIL/uL (ref 3.87–5.11)
WBC: 9 10*3/uL (ref 4.0–10.5)

## 2012-07-25 SURGERY — REPAIR, HERNIA, VENTRAL, LAPAROSCOPIC
Anesthesia: General | Site: Abdomen | Wound class: Clean

## 2012-07-25 MED ORDER — ONDANSETRON HCL 4 MG PO TABS: 4.0000 mg | ORAL_TABLET | Freq: Four times a day (QID) | ORAL | Status: DC | PRN

## 2012-07-25 MED ORDER — ALBUMIN HUMAN 5 % IV SOLN
INTRAVENOUS | Status: DC | PRN
  Administered 2012-07-25: 09:00:00 via INTRAVENOUS

## 2012-07-25 MED ORDER — TRAZODONE HCL 150 MG PO TABS
150.0000 mg | ORAL_TABLET | Freq: Every day | ORAL | Status: DC
  Administered 2012-07-25 – 2012-07-28 (×4): 150 mg via ORAL
  Filled 2012-07-25 (×5): qty 1

## 2012-07-25 MED ORDER — LIDOCAINE HCL (CARDIAC) 20 MG/ML IV SOLN
INTRAVENOUS | Status: DC | PRN
  Administered 2012-07-25: 100 mg via INTRAVENOUS

## 2012-07-25 MED ORDER — NEOSTIGMINE METHYLSULFATE 1 MG/ML IJ SOLN
INTRAMUSCULAR | Status: DC | PRN
  Administered 2012-07-25: 4 mg via INTRAVENOUS
  Administered 2012-07-25: 1 mg via INTRAVENOUS

## 2012-07-25 MED ORDER — LACTATED RINGERS IV SOLN
INTRAVENOUS | Status: DC | PRN
  Administered 2012-07-25 (×2): via INTRAVENOUS

## 2012-07-25 MED ORDER — POTASSIUM CITRATE ER 10 MEQ (1080 MG) PO TBCR
10.0000 meq | EXTENDED_RELEASE_TABLET | Freq: Three times a day (TID) | ORAL | Status: DC
  Administered 2012-07-25 – 2012-07-28 (×10): 10 meq via ORAL
  Filled 2012-07-25 (×14): qty 1

## 2012-07-25 MED ORDER — GLYCOPYRROLATE 0.2 MG/ML IJ SOLN
INTRAMUSCULAR | Status: DC | PRN
  Administered 2012-07-25: 0.6 mg via INTRAVENOUS
  Administered 2012-07-25: 0.2 mg via INTRAVENOUS

## 2012-07-25 MED ORDER — DEXTROSE 5 % IV SOLN: 3.0000 g | Freq: Three times a day (TID) | INTRAVENOUS | Status: DC

## 2012-07-25 MED ORDER — FENTANYL CITRATE 0.05 MG/ML IJ SOLN
INTRAMUSCULAR | Status: DC | PRN
  Administered 2012-07-25 (×3): 50 ug via INTRAVENOUS

## 2012-07-25 MED ORDER — ONDANSETRON HCL 4 MG/2ML IJ SOLN
4.0000 mg | Freq: Four times a day (QID) | INTRAMUSCULAR | Status: DC | PRN
  Administered 2012-07-26: 4 mg via INTRAVENOUS
  Filled 2012-07-25: qty 2

## 2012-07-25 MED ORDER — OXYCODONE HCL 5 MG/5ML PO SOLN: 5.0000 mg | Freq: Once | ORAL | Status: DC | PRN

## 2012-07-25 MED ORDER — HEPARIN SODIUM (PORCINE) 5000 UNIT/ML IJ SOLN
5000.0000 [IU] | Freq: Three times a day (TID) | INTRAMUSCULAR | Status: DC
  Administered 2012-07-26 – 2012-07-28 (×7): 5000 [IU] via SUBCUTANEOUS
  Filled 2012-07-25 (×13): qty 1

## 2012-07-25 MED ORDER — HYDROMORPHONE HCL PF 1 MG/ML IJ SOLN
0.2500 mg | INTRAMUSCULAR | Status: DC | PRN
  Administered 2012-07-25 (×3): 0.5 mg via INTRAVENOUS

## 2012-07-25 MED ORDER — ONDANSETRON HCL 4 MG/2ML IJ SOLN
INTRAMUSCULAR | Status: DC | PRN
  Administered 2012-07-25: 4 mg via INTRAVENOUS

## 2012-07-25 MED ORDER — ARTIFICIAL TEARS OP OINT
TOPICAL_OINTMENT | OPHTHALMIC | Status: DC | PRN
  Administered 2012-07-25: 1 via OPHTHALMIC

## 2012-07-25 MED ORDER — PANTOPRAZOLE SODIUM 40 MG PO TBEC
80.0000 mg | DELAYED_RELEASE_TABLET | Freq: Every day | ORAL | Status: DC
  Administered 2012-07-26: 80 mg via ORAL
  Administered 2012-07-27: 40 mg via ORAL
  Filled 2012-07-25 (×2): qty 2

## 2012-07-25 MED ORDER — MORPHINE SULFATE 2 MG/ML IJ SOLN
2.0000 mg | INTRAMUSCULAR | Status: DC | PRN
  Administered 2012-07-25 – 2012-07-26 (×3): 2 mg via INTRAVENOUS
  Filled 2012-07-25 (×4): qty 1

## 2012-07-25 MED ORDER — VITAMIN B-6 100 MG PO TABS
100.0000 mg | ORAL_TABLET | Freq: Every day | ORAL | Status: DC
  Administered 2012-07-25 – 2012-07-28 (×4): 100 mg via ORAL
  Filled 2012-07-25 (×6): qty 1

## 2012-07-25 MED ORDER — BUSPIRONE HCL 15 MG PO TABS
30.0000 mg | ORAL_TABLET | Freq: Three times a day (TID) | ORAL | Status: DC
  Administered 2012-07-25 – 2012-07-29 (×9): 30 mg via ORAL
  Filled 2012-07-25 (×18): qty 2

## 2012-07-25 MED ORDER — EPHEDRINE SULFATE 50 MG/ML IJ SOLN
INTRAMUSCULAR | Status: DC | PRN
  Administered 2012-07-25: 10 mg via INTRAVENOUS
  Administered 2012-07-25: 15 mg via INTRAVENOUS
  Administered 2012-07-25: 10 mg via INTRAVENOUS

## 2012-07-25 MED ORDER — ALBUTEROL SULFATE (5 MG/ML) 0.5% IN NEBU
2.5000 mg | INHALATION_SOLUTION | Freq: Once | RESPIRATORY_TRACT | Status: AC
  Administered 2012-07-25: 2.5 mg via RESPIRATORY_TRACT

## 2012-07-25 MED ORDER — MIDAZOLAM HCL 5 MG/5ML IJ SOLN
INTRAMUSCULAR | Status: DC | PRN
  Administered 2012-07-25 (×2): 1 mg via INTRAVENOUS

## 2012-07-25 MED ORDER — MORPHINE SULFATE 2 MG/ML IJ SOLN
INTRAMUSCULAR | Status: AC
  Administered 2012-07-25: 2 mg via INTRAVENOUS
  Filled 2012-07-25: qty 1

## 2012-07-25 MED ORDER — ROCURONIUM BROMIDE 100 MG/10ML IV SOLN
INTRAVENOUS | Status: DC | PRN
  Administered 2012-07-25: 10 mg via INTRAVENOUS
  Administered 2012-07-25: 50 mg via INTRAVENOUS

## 2012-07-25 MED ORDER — POTASSIUM CHLORIDE IN NACL 20-0.9 MEQ/L-% IV SOLN
INTRAVENOUS | Status: DC
  Administered 2012-07-25 – 2012-07-27 (×4): via INTRAVENOUS
  Filled 2012-07-25 (×9): qty 1000

## 2012-07-25 MED ORDER — INTERFERON BETA-1B 0.3 MG ~~LOC~~ SOLR: 0.2500 mg | SUBCUTANEOUS | Status: DC

## 2012-07-25 MED ORDER — SODIUM CHLORIDE 0.9 % IR SOLN
Status: DC | PRN
  Administered 2012-07-25: 09:00:00

## 2012-07-25 MED ORDER — BUPIVACAINE-EPINEPHRINE 0.25% -1:200000 IJ SOLN
INTRAMUSCULAR | Status: DC | PRN
  Administered 2012-07-25: 20 mL

## 2012-07-25 MED ORDER — FAMOTIDINE 20 MG PO TABS
20.0000 mg | ORAL_TABLET | Freq: Every day | ORAL | Status: DC
  Administered 2012-07-25 – 2012-07-28 (×4): 20 mg via ORAL
  Filled 2012-07-25 (×6): qty 1

## 2012-07-25 MED ORDER — ALBUTEROL SULFATE (5 MG/ML) 0.5% IN NEBU
INHALATION_SOLUTION | RESPIRATORY_TRACT | Status: AC
  Administered 2012-07-25: 2.5 mg via RESPIRATORY_TRACT
  Filled 2012-07-25: qty 0.5

## 2012-07-25 MED ORDER — HYDROMORPHONE HCL PF 1 MG/ML IJ SOLN
INTRAMUSCULAR | Status: AC
  Filled 2012-07-25: qty 2

## 2012-07-25 MED ORDER — SODIUM CHLORIDE 0.9 % IR SOLN
Status: DC | PRN
  Administered 2012-07-25: 1000 mL

## 2012-07-25 MED ORDER — OXYCODONE HCL 5 MG PO TABS: 5.0000 mg | ORAL_TABLET | Freq: Once | ORAL | Status: DC | PRN

## 2012-07-25 MED ORDER — 0.9 % SODIUM CHLORIDE (POUR BTL) OPTIME
TOPICAL | Status: DC | PRN
  Administered 2012-07-25: 1000 mL

## 2012-07-25 MED ORDER — METOPROLOL SUCCINATE ER 50 MG PO TB24
50.0000 mg | ORAL_TABLET | Freq: Every day | ORAL | Status: DC
  Administered 2012-07-26 – 2012-07-28 (×3): 50 mg via ORAL
  Filled 2012-07-25 (×4): qty 1

## 2012-07-25 MED ORDER — LEVOTHYROXINE SODIUM 112 MCG PO TABS
112.0000 ug | ORAL_TABLET | Freq: Every day | ORAL | Status: DC
  Administered 2012-07-26 – 2012-07-29 (×4): 112 ug via ORAL
  Filled 2012-07-25 (×6): qty 1

## 2012-07-25 MED ORDER — SERTRALINE HCL 50 MG PO TABS
150.0000 mg | ORAL_TABLET | Freq: Every day | ORAL | Status: DC
  Administered 2012-07-26 – 2012-07-28 (×3): 150 mg via ORAL
  Filled 2012-07-25 (×4): qty 1

## 2012-07-25 MED ORDER — METOCLOPRAMIDE HCL 5 MG/ML IJ SOLN: 10.0000 mg | Freq: Once | INTRAMUSCULAR | Status: DC | PRN

## 2012-07-25 MED ORDER — ROPINIROLE HCL 1 MG PO TABS
1.0000 mg | ORAL_TABLET | Freq: Three times a day (TID) | ORAL | Status: DC
  Administered 2012-07-25 – 2012-07-28 (×13): 1 mg via ORAL
  Filled 2012-07-25 (×19): qty 1

## 2012-07-25 MED ORDER — BUPIVACAINE-EPINEPHRINE PF 0.25-1:200000 % IJ SOLN
INTRAMUSCULAR | Status: AC
  Filled 2012-07-25: qty 30

## 2012-07-25 MED ORDER — ROPINIROLE HCL 1 MG PO TABS
1.0000 mg | ORAL_TABLET | Freq: Four times a day (QID) | ORAL | Status: DC
  Administered 2012-07-25: 1 mg via ORAL
  Filled 2012-07-25 (×3): qty 1

## 2012-07-25 MED ORDER — HYDROCODONE-ACETAMINOPHEN 5-325 MG PO TABS
1.0000 | ORAL_TABLET | ORAL | Status: DC | PRN
  Administered 2012-07-26 (×2): 2 via ORAL
  Administered 2012-07-27: 1 via ORAL
  Administered 2012-07-27 – 2012-07-29 (×7): 2 via ORAL
  Filled 2012-07-25 (×3): qty 2
  Filled 2012-07-25 (×2): qty 1
  Filled 2012-07-25 (×5): qty 2
  Filled 2012-07-25: qty 1

## 2012-07-25 MED ORDER — CEFAZOLIN SODIUM-DEXTROSE 2-3 GM-% IV SOLR
2.0000 g | Freq: Three times a day (TID) | INTRAVENOUS | Status: AC
  Administered 2012-07-25 – 2012-07-26 (×3): 2 g via INTRAVENOUS
  Filled 2012-07-25 (×3): qty 50

## 2012-07-25 MED ORDER — PROPOFOL 10 MG/ML IV BOLUS
INTRAVENOUS | Status: DC | PRN
  Administered 2012-07-25: 140 mg via INTRAVENOUS

## 2012-07-25 SURGICAL SUPPLY — 69 items
APL SKNCLS STERI-STRIP NONHPOA (GAUZE/BANDAGES/DRESSINGS) ×2
APPLICATOR COTTON TIP 6IN STRL (MISCELLANEOUS) ×6 IMPLANT
APPLIER CLIP LOGIC TI 5 (MISCELLANEOUS) IMPLANT
APR CLP MED LRG 33X5 (MISCELLANEOUS)
BAG DECANTER FOR FLEXI CONT (MISCELLANEOUS) ×3 IMPLANT
BENZOIN TINCTURE PRP APPL 2/3 (GAUZE/BANDAGES/DRESSINGS) ×3 IMPLANT
BINDER ABD UNIV 12 45-62 (WOUND CARE) ×2 IMPLANT
BINDER ABDOMINAL 46IN 62IN (WOUND CARE) ×3
BLADE SURG ROTATE 9660 (MISCELLANEOUS) IMPLANT
CANISTER SUCTION 2500CC (MISCELLANEOUS) ×3 IMPLANT
CHLORAPREP W/TINT 26ML (MISCELLANEOUS) ×3 IMPLANT
CLOTH BEACON ORANGE TIMEOUT ST (SAFETY) ×3 IMPLANT
COVER SURGICAL LIGHT HANDLE (MISCELLANEOUS) ×3 IMPLANT
DECANTER SPIKE VIAL GLASS SM (MISCELLANEOUS) ×3 IMPLANT
DEVICE SECURE STRAP 25 ABSORB (INSTRUMENTS) ×3 IMPLANT
DEVICE TROCAR PUNCTURE CLOSURE (ENDOMECHANICALS) ×3 IMPLANT
DRAPE LAPAROSCOPIC ABDOMINAL (DRAPES) IMPLANT
DRAPE UTILITY 15X26 W/TAPE STR (DRAPE) ×6 IMPLANT
ELECT CAUTERY BLADE 6.4 (BLADE) IMPLANT
ELECT REM PT RETURN 9FT ADLT (ELECTROSURGICAL) ×3
ELECTRODE REM PT RTRN 9FT ADLT (ELECTROSURGICAL) ×2 IMPLANT
GLOVE BIO SURGEON STRL SZ7 (GLOVE) ×6 IMPLANT
GLOVE BIO SURGEON STRL SZ7.5 (GLOVE) ×9 IMPLANT
GLOVE BIOGEL PI IND STRL 7.0 (GLOVE) ×2 IMPLANT
GLOVE BIOGEL PI IND STRL 7.5 (GLOVE) ×8 IMPLANT
GLOVE BIOGEL PI INDICATOR 7.0 (GLOVE) ×1
GLOVE BIOGEL PI INDICATOR 7.5 (GLOVE) ×4
GLOVE EUDERMIC 7 POWDERFREE (GLOVE) ×3 IMPLANT
GLOVE SURG ORTHO 8.0 STRL STRW (GLOVE) ×3 IMPLANT
GOWN PREVENTION PLUS XLARGE (GOWN DISPOSABLE) ×6 IMPLANT
GOWN STRL NON-REIN LRG LVL3 (GOWN DISPOSABLE) ×12 IMPLANT
KIT BASIN OR (CUSTOM PROCEDURE TRAY) ×3 IMPLANT
KIT ROOM TURNOVER OR (KITS) ×3 IMPLANT
MESH VENTRALIGHT ST 10X13IN (Mesh General) ×3 IMPLANT
NEEDLE HYPO 25GX1X1/2 BEV (NEEDLE) IMPLANT
NEEDLE SPNL 22GX3.5 QUINCKE BK (NEEDLE) ×3 IMPLANT
NS IRRIG 1000ML POUR BTL (IV SOLUTION) ×3 IMPLANT
PACK GENERAL/GYN (CUSTOM PROCEDURE TRAY) IMPLANT
PAD ARMBOARD 7.5X6 YLW CONV (MISCELLANEOUS) ×6 IMPLANT
PEN SKIN MARKING BROAD (MISCELLANEOUS) ×3 IMPLANT
SCALPEL HARMONIC ACE (MISCELLANEOUS) ×3 IMPLANT
SCISSORS LAP 5X35 DISP (ENDOMECHANICALS) ×3 IMPLANT
SET IRRIG TUBING LAPAROSCOPIC (IRRIGATION / IRRIGATOR) ×3 IMPLANT
SLEEVE ENDOPATH XCEL 5M (ENDOMECHANICALS) ×9 IMPLANT
SPONGE GAUZE 4X4 12PLY (GAUZE/BANDAGES/DRESSINGS) ×3 IMPLANT
STAPLER VISISTAT 35W (STAPLE) ×3 IMPLANT
SUT MON AB 4-0 PC3 18 (SUTURE) IMPLANT
SUT NOVA 1 T20/GS 25DT (SUTURE) ×3 IMPLANT
SUT NOVA NAB DX-16 0-1 5-0 T12 (SUTURE) ×6 IMPLANT
SUT NOVA NAB GS-21 0 18 T12 DT (SUTURE) ×6 IMPLANT
SUT NOVA NAB GS-21 1 T12 (SUTURE) IMPLANT
SUT NOVAFIL 0 T 12 (SUTURE) ×6 IMPLANT
SUT PDS AB 1 CTX 36 (SUTURE) IMPLANT
SUT PROLENE 0 CT 1 CR/8 (SUTURE) IMPLANT
SUT VIC AB 2-0 CT1 27 (SUTURE)
SUT VIC AB 2-0 CT1 TAPERPNT 27 (SUTURE) IMPLANT
SUT VIC AB 3-0 CT1 27 (SUTURE)
SUT VIC AB 3-0 CT1 TAPERPNT 27 (SUTURE) IMPLANT
SUT VICRYL 0 TIES 12 18 (SUTURE) IMPLANT
SYR CONTROL 10ML LL (SYRINGE) IMPLANT
TACKER 5MM HERNIA 3.5CML NAB (ENDOMECHANICALS) ×9 IMPLANT
TOWEL OR 17X24 6PK STRL BLUE (TOWEL DISPOSABLE) IMPLANT
TOWEL OR 17X26 10 PK STRL BLUE (TOWEL DISPOSABLE) ×3 IMPLANT
TRAY FOLEY CATH 14FRSI W/METER (CATHETERS) ×3 IMPLANT
TRAY LAPAROSCOPIC (CUSTOM PROCEDURE TRAY) ×3 IMPLANT
TROCAR XCEL 12X100 BLDLESS (ENDOMECHANICALS) ×3 IMPLANT
TROCAR XCEL NON-BLD 11X100MML (ENDOMECHANICALS) IMPLANT
TROCAR XCEL NON-BLD 5MMX100MML (ENDOMECHANICALS) ×3 IMPLANT
WATER STERILE IRR 1000ML POUR (IV SOLUTION) IMPLANT

## 2012-07-25 NOTE — Preoperative (Signed)
Beta Blockers   Reason not to administer Beta Blockers:Not Applicable 

## 2012-07-25 NOTE — Anesthesia Procedure Notes (Signed)
Procedure Name: Intubation Date/Time: 07/25/2012 7:45 AM Performed by: Fransisca Kaufmann Pre-anesthesia Checklist: Patient identified, Timeout performed, Emergency Drugs available, Suction available and Patient being monitored Patient Re-evaluated:Patient Re-evaluated prior to inductionOxygen Delivery Method: Circle system utilized Preoxygenation: Pre-oxygenation with 100% oxygen Intubation Type: IV induction Ventilation: Mask ventilation without difficulty Laryngoscope Size: Miller and 3 Grade View: Grade I Tube type: Oral Tube size: 7.5 mm Number of attempts: 1 Airway Equipment and Method: Stylet Placement Confirmation: ETT inserted through vocal cords under direct vision,  breath sounds checked- equal and bilateral,  positive ETCO2 and CO2 detector Secured at: 21 cm Dental Injury: Teeth and Oropharynx as per pre-operative assessment

## 2012-07-25 NOTE — Interval H&P Note (Signed)
History and Physical Interval Note:  07/25/2012 7:11 AM  Jocelyn Wilson  has presented today for surgery, with the diagnosis of ventral hernia  The goals and the various methods of treatment have been discussed with the patient and family. After consideration of risks, benefits and other options for treatment, the patient has consented to  Procedure(s) (LRB) with comments: LAPAROSCOPIC VENTRAL HERNIA (N/A) - Laparoscopic ventral hernia repair with mesh, possible open INSERTION OF MESH (N/A) HERNIA REPAIR VENTRAL ADULT (N/A) as a surgical intervention .  The patient's history has been reviewed, patient examined, no change in status, stable for surgery.  I have reviewed the patient's chart and labs.  Questions were answered to the patient's satisfaction.     Ernestene Mention

## 2012-07-25 NOTE — Anesthesia Preprocedure Evaluation (Signed)
Anesthesia Evaluation Anesthesia Physical Anesthesia Plan  ASA: III  Anesthesia Plan: General   Post-op Pain Management:    Induction: Intravenous  Airway Management Planned: Oral ETT  Additional Equipment:   Intra-op Plan:   Post-operative Plan: Extubation in OR  Informed Consent: I have reviewed the patients History and Physical, chart, labs and discussed the procedure including the risks, benefits and alternatives for the proposed anesthesia with the patient or authorized representative who has indicated his/her understanding and acceptance.     Plan Discussed with:   Anesthesia Plan Comments: (See surgeon's H&P )        Anesthesia Quick Evaluation

## 2012-07-25 NOTE — Op Note (Addendum)
Patient Name:           Jocelyn Wilson   Date of Surgery:        07/25/2012  Pre op Diagnosis:      Multiple incarcerated ventral incisional hernias  Post op Diagnosis:    Same  Procedure:                 Laparoscopic lysis of adhesions, laparoscopic repair of multiple incarcerated ventral hernias with 25 cm x 33 cm Ventra-lite mesh  Surgeon:                     Angelia Mould. Derrell Lolling, M.D., FACS  Assistant:                      Darnell Level, M.D., FACS   Operative Indications:    She was initially referred by Dr. Ihor Dow at [facility] for evaluation of a ventral incisional hernia.  This patient underwent a major laparotomy through a midline incision in 1984 and underwent cholecystectomy and hiatal hernia repair in [place]. She states she has had a bulge just to the left of the midline for about 3-4 years. She has some  pain and a bulge. . She's never had a bowel obstruction. She denies any other abdominal surgeries.  Had a colonoscopy in 2010.Marland Kitchen  Recent CT scan shows multiple incisional hernia defects containing fatty tissue. These are all of from just above the umbilicus and up the midline. There is one large, one medium size and one tiny hernia. No other hernias are noted. There is no entrapped intestine or other abnormality. Examination reveals a morbidly obese female with an incarcerated hernia just above and to the left of the umbilicus. The hernia sac appears filled with omentum and is approximately 8 cm in size. I cannot feel the defect in the fascia. Comorbidities include morbid obesity, anxiety and depression on multiple drugs, history of atrial tachycardia, history of open cholecystectomy hiatal hernia repair, multiple sclerosis, GERD. We have  talked about what would be involved in repairing this hernia. Hopefully this can be done laparoscopically, assuming we can reduce the omentum. There is a significant probability that we may have to convert this to an open operation because  of the incarceration. We discussed numerous risks, including bleeding, infection, recurrence of the hernia, injury to the intestine, cardiac and pulmonary complications. She will need to be in the hospital several days and she will be out of work for a month. We answered lots and lots of questions that she had.. She wants to have the surgery because of the pain and bulge. She understands the natural history of the hernia is to slowly progress and possibly involve the intestine later in life.  Operative Findings:       The patient had multiple incarcerated ventral hernias. They contained only the omentum. The largest hernia and probably a 4 to 5 cm defect, it was above the umbilicus. It took a long  time debriding and removing the omentum from this but ultimately I removed all the omentum and did not leave any omentum entrapped behind. More superior to this there was a smaller hernia which was more easily reduced. There might have been a tiny pinhole hernia in the subxiphoid region. We chose to repair all of this with a 33 cm vertically x 25 cm wide piece of Ventra-lite composite mesh which gave 6 or 7 cm of overlap in all directions.  Procedure in  Detail:          Following the induction of general endotracheal anesthesia, a Foley catheter was placed, oral gastric tube was placed, intravenous antibiotics were given, and the abdomen prepped and draped in sterile fashion. Surgical time out was performed.  A 5 mm optical port was placed in the left subcostal region. This was uneventful. Pneumoperitoneum was created. I placed two 5 mm ports in the left mid-abdomen and left lower quadrant and ultimately replaced the left subcostal port with a 12 mm port. Ultimately we had two 5 mm ports placed on the right lateral abdominal wall. We slowly took down all of the adhesions and incarcerated contents. This took a long time with numerous small steps using blunt dissection and sharp dissection and the harmonic scalpel.  There was no incarcerated colon or small bowel. Ultimately  we took  all the adhesions and removed all the omentum and we mapped out where the hernias were. . The hernias were not that large except for the one just above the umbilicus which was 4 or 5 cm in diameter. We brought a 33 cm x 25 cm piece of ventra-Lite composite mesh to the operative field. We released pneumoperitoneum and marked a template of the abdominal wall where the mesh would be placed for the best coverage. We marked 8 equidistant suture fixation sites. We placed 8 sutures of #1 Novofil in the mesh being careful to tie the knots on the rough upper side so that we would keep the smooth visceral side down. After all this was done we moistened the mesh, rolled up and inserted through the large port. We positioned the mesh and then brought up all 8 fixation sutures through the abdominal wall being careful to take a 1 cm bites of fascia. We pulled up all of the sutures and found the mesh deployed nicely without much redundancy. We tied all 8 sutures. We then secured the mesh in a double crown technique with about 75  Five mm. Pro-Tackers. Very carefully around the outer rim of the mesh to be sure there is no gaps and then an inner crown as well. This provided very secure coverage and no redundancy or overlapping. We inspected this several times from both sides and found no defects. There was no bleeding. The omentum and bowel looked good. The trocars were removed and the pneumoperitoneum was released. The skin incisions were closed with skin staples. A velcro binder was placed. Patient taken to recovery in stable condition.  EBL 25 cc, counts correct, complications none.     Angelia Mould. Derrell Lolling, M.D., FACS General and Minimally Invasive Surgery Breast and Colorectal Surgery  07/25/2012 10:14 AM

## 2012-07-25 NOTE — Transfer of Care (Signed)
Immediate Anesthesia Transfer of Care Note  Patient: Jocelyn Wilson  Procedure(s) Performed: Procedure(s) (LRB) with comments: LAPAROSCOPIC VENTRAL HERNIA (N/A) INSERTION OF MESH (N/A)  Patient Location: PACU  Anesthesia Type: General  Level of Consciousness: awake, alert , oriented and sedated  Airway & Oxygen Therapy: Patient Spontanous Breathing and Patient connected to face mask oxygen  Post-op Assessment: Report given to PACU RN, Post -op Vital signs reviewed and stable and Patient moving all extremities  Post vital signs: Reviewed and stable  Complications: No apparent anesthesia complications

## 2012-07-25 NOTE — Anesthesia Postprocedure Evaluation (Signed)
Anesthesia Post Note  Patient: Jocelyn Wilson  Procedure(s) Performed: Procedure(s) (LRB): LAPAROSCOPIC VENTRAL HERNIA (N/A) INSERTION OF MESH (N/A)  Anesthesia type: general  Patient location: PACU  Post pain: Pain level controlled  Post assessment: Patient's Cardiovascular Status Stable  Last Vitals:  Filed Vitals:   07/25/12 1145  BP:   Pulse: 69  Temp:   Resp: 19    Post vital signs: Reviewed and stable  Level of consciousness: sedated  Complications: No apparent anesthesia complications

## 2012-07-26 ENCOUNTER — Encounter (HOSPITAL_COMMUNITY): Payer: Self-pay | Admitting: General Surgery

## 2012-07-26 LAB — CBC
MCHC: 31.3 g/dL (ref 30.0–36.0)
MCV: 93.4 fL (ref 78.0–100.0)
Platelets: 192 10*3/uL (ref 150–400)
RDW: 14 % (ref 11.5–15.5)
WBC: 6.6 10*3/uL (ref 4.0–10.5)

## 2012-07-26 LAB — BASIC METABOLIC PANEL
BUN: 11 mg/dL (ref 6–23)
CO2: 26 mEq/L (ref 19–32)
Calcium: 8.6 mg/dL (ref 8.4–10.5)
Creatinine, Ser: 0.65 mg/dL (ref 0.50–1.10)
GFR calc Af Amer: >90 mL/min (ref >90)

## 2012-07-26 MED ORDER — INFLUENZA VIRUS VACC SPLIT PF IM SUSP
0.5000 mL | INTRAMUSCULAR | Status: AC
  Administered 2012-07-27: 0.5 mL via INTRAMUSCULAR
  Filled 2012-07-26: qty 0.5

## 2012-07-26 MED ORDER — INTERFERON BETA-1B 0.3 MG ~~LOC~~ SOLR
0.2500 mg | SUBCUTANEOUS | Status: DC
  Administered 2012-07-26: 0.25 mg via SUBCUTANEOUS

## 2012-07-26 NOTE — Progress Notes (Addendum)
1 Day Post-Op  Subjective: Stable. alert. In no distress. Mental status normal. Notes some gas-like pain. No nausea or vomiting. Foley catheter still in. Hasn't ambulated yet. Pain control seems good.  Objective: Vital signs in last 24 hours: Temp:  [97.4 F (36.3 C)-99.3 F (37.4 C)] 99.3 F (37.4 C) (10/16 0146) Pulse Rate:  [69-96] 84  (10/16 0146) Resp:  [18-25] 20  (10/16 0146) BP: (104-140)/(55-92) 114/86 mmHg (10/16 0146) SpO2:  [89 %-98 %] 96 % (10/16 0146) FiO2 (%):  [4 %] 4 % (10/15 1345) Weight:  [206 lb 12.7 oz (93.801 kg)] 206 lb 12.7 oz (93.801 kg) (10/15 1345) Last BM Date: 07/24/12  Intake/Output from previous day: 10/15 0701 - 10/16 0700 In: 2361 [I.V.:2111; IV Piggyback:250] Out: 1475 [Urine:1475] Intake/Output this shift: Total I/O In: 111 [I.V.:111] Out: 900 [Urine:900]  General appearance: alert and cooperative. No distress. Mental status normal. Resp: clear to auscultation bilaterally GI: abdomen obese. Soft. Minimal bowel sounds. Wounds looked fine. Mild but appropriate incisional tenderness.  Lab Results:  Results for orders placed during the hospital encounter of 07/25/12 (from the past 24 hour(s))  CBC     Status: Abnormal   Collection Time   07/25/12  1:49 PM      Component Value Range   WBC 9.0  4.0 - 10.5 K/uL   RBC 3.96  3.87 - 5.11 MIL/uL   Hemoglobin 11.6 (*) 12.0 - 15.0 g/dL   HCT 16.1  09.6 - 04.5 %   MCV 92.7  78.0 - 100.0 fL   MCH 29.3  26.0 - 34.0 pg   MCHC 31.6  30.0 - 36.0 g/dL   RDW 40.9  81.1 - 91.4 %   Platelets 183  150 - 400 K/uL  CREATININE, SERUM     Status: Normal   Collection Time   07/25/12  1:49 PM      Component Value Range   Creatinine, Ser 0.69  0.50 - 1.10 mg/dL   GFR calc non Af Amer >90  >90 mL/min   GFR calc Af Amer >90  >90 mL/min     Studies/Results: @RISRSLT24 @     . albuterol  2.5 mg Nebulization Once  . albuterol      . busPIRone  30 mg Oral TID  .  ceFAZolin (ANCEF) IV  3 g Intravenous 60  min Pre-Op  .  ceFAZolin (ANCEF) IV  2 g Intravenous Q8H  . famotidine  20 mg Oral Daily  . heparin  5,000 Units Subcutaneous Once  . heparin  5,000 Units Subcutaneous Q8H  . HYDROmorphone      . interferon beta-1b  0.25 mg Subcutaneous QODAY  . levothyroxine  112 mcg Oral Daily  . metoprolol succinate  50 mg Oral Daily  . morphine      . pantoprazole  80 mg Oral Q1200  . potassium citrate  10 mEq Oral TID WC  . pyridOXINE  100 mg Oral Daily  . rOPINIRole  1 mg Oral TID AC & HS  . sertraline  150 mg Oral Daily  . traZODone  150 mg Oral QHS  . DISCONTD:  ceFAZolin (ANCEF) IV  3 g Intravenous Q8H  . DISCONTD: chlorhexidine  1 application Topical Once  . DISCONTD: rOPINIRole  1 mg Oral QID     Assessment/Plan: s/p Procedure(s): LAPAROSCOPIC VENTRAL HERNIA INSERTION OF MESH   POD #1. Stable. Advanced to clear liquids. Discontinue Foley Incentive spirometry Ambulate in hall Check lab work.  Multiple comorbidities: Morbid obesity Anxiety  and depression on multiple psychotropic drugs History atrial tachycardia History of cholecystectomy hiatal hernia repair Multiple sclerosis GERD.    LOS: 1 day    Jocelyn Wilson, M.D., [facility] General and Minimally invasive Surgery Breast and Colorectal Surgery Office:   [phone] Pager:   [phone]  07/26/2012  . .prob

## 2012-07-27 MED ORDER — PANTOPRAZOLE SODIUM 40 MG PO TBEC: 40.0000 mg | DELAYED_RELEASE_TABLET | Freq: Once | ORAL | Status: AC

## 2012-07-27 MED ORDER — PANTOPRAZOLE SODIUM 40 MG PO TBEC
40.0000 mg | DELAYED_RELEASE_TABLET | Freq: Two times a day (BID) | ORAL | Status: DC
  Administered 2012-07-28: 40 mg via ORAL
  Filled 2012-07-27 (×2): qty 1

## 2012-07-27 MED ORDER — POLYETHYLENE GLYCOL 3350 17 G PO PACK
17.0000 g | PACK | Freq: Every day | ORAL | Status: DC
  Administered 2012-07-27 – 2012-07-28 (×2): 17 g via ORAL
  Filled 2012-07-27 (×3): qty 1

## 2012-07-27 NOTE — Progress Notes (Addendum)
2 Days Post-Op  Subjective: Tolerating clear liquids. Ambulating in room. Voiding uneventfully. Minimal nausea. No flatus or stool. Feels that she can eat solid food. No arrhythmias  Objective: Vital signs in last 24 hours: Temp:  [97.4 F (36.3 C)-98.9 F (37.2 C)] 98.4 F (36.9 C) (10/16 2147) Pulse Rate:  [80-83] 83  (10/16 2147) Resp:  [15-17] 16  (10/16 2147) BP: (121-153)/(60-75) 121/60 mmHg (10/16 2147) SpO2:  [90 %-94 %] 91 % (10/16 2147) Last BM Date: 07/24/12  Intake/Output from previous day: 10/16 0701 - 10/17 0700 In: 1597.4 [I.V.:1597.4] Out: -  Intake/Output this shift: Total I/O In: 562 [I.V.:562] Out: -   General appearance: alert. Cooperative. Pleasant. No distress. Husband and rhythm. GI: obese. Soft. Active bowel sounds. Wounds looked good.  Lab Results:  No results found for this or any previous visit (from the past 24 hour(s)).   Studies/Results: @RISRSLT24 @     . busPIRone  30 mg Oral TID  .  ceFAZolin (ANCEF) IV  2 g Intravenous Q8H  . famotidine  20 mg Oral Daily  . heparin  5,000 Units Subcutaneous Q8H  . influenza  inactive virus vaccine  0.5 mL Intramuscular Tomorrow-1000  . interferon beta-1b  0.25 mg Subcutaneous QODAY  . levothyroxine  112 mcg Oral Daily  . metoprolol succinate  50 mg Oral Daily  . pantoprazole  80 mg Oral Q1200  . potassium citrate  10 mEq Oral TID WC  . pyridOXINE  100 mg Oral Daily  . rOPINIRole  1 mg Oral TID AC & HS  . sertraline  150 mg Oral Daily  . traZODone  150 mg Oral QHS  . DISCONTD: interferon beta-1b  0.25 mg Subcutaneous QODAY     Assessment/Plan: s/p Procedure(s): LAPAROSCOPIC VENTRAL HERNIA INSERTION OF MESH  POD #2. Stable. Improving. Await resolution of ileus Advanced to regular diet MiraLAX Discontinue telemetry Ambulating the hall May shower Probable discharge home tomorrow.   Multiple comorbidities:  Morbid obesity  Anxiety and depression on multiple psychotropic drugs  History  atrial tachycardia  History of cholecystectomy hiatal hernia repair  Multiple sclerosis  GERD.     LOS: 2 days    Jude Linck M. Derrell Lolling, M.D., [facility] General and Minimally invasive Surgery Breast and Colorectal Surgery Office:   [phone] Pager:   [phone]  07/27/2012  . .prob

## 2012-07-28 MED ORDER — POLYETHYLENE GLYCOL 3350 17 G PO PACK
17.0000 g | PACK | Freq: Two times a day (BID) | ORAL | Status: DC
  Filled 2012-07-28 (×2): qty 1

## 2012-07-28 NOTE — Progress Notes (Signed)
Pt A/O, no noted distress. Pt slept throughout the night. She is disappointed resulted to she will not be d/c tonight. Involved pt IV could be d/c if she manage to tolerate a 800 cc of fluids. She was advised to to let staff know when she intake fluids.

## 2012-07-28 NOTE — Progress Notes (Signed)
3 Days Post-Op  Subjective: Doing reasonably well, but still has not had a stool yet. Passing a little bit of flatus and eating solid food. Ambulating in the hall. Sleeps in a chair because of chronic reflux but sleeping well. No respiratory problems. Feels a little bloated. Controlling  pain with Vicodin.  Objective: Vital signs in last 24 hours: Temp:  [98.4 F (36.9 C)-99 F (37.2 C)] 98.4 F (36.9 C) (10/17 2114) Pulse Rate:  [83-96] 83  (10/17 2114) Resp:  [18] 18  (10/17 2114) BP: (112-130)/(65-75) 121/65 mmHg (10/17 2114) SpO2:  [93 %-96 %] 96 % (10/17 2114) FiO2 (%):  [2 %] 2 % (10/17 1339) Last BM Date: 07/24/12  Intake/Output from previous day: 10/17 0701 - 10/18 0700 In: 760 [P.O.:360; I.V.:400] Out: -  Intake/Output this shift:    General appearance: pleasant. Alert. Mental status normal. Sitting in a chair. No distress. Resp: clear to auscultation bilaterally GI: abdomen obese. Possibly slightly distended but soft, not tympanitic. All wounds look fine. Hypoactive bowel sounds.  Lab Results:  No results found for this or any previous visit (from the past 24 hour(s)).   Studies/Results: @RISRSLT24 @     . busPIRone  30 mg Oral TID  . famotidine  20 mg Oral Daily  . heparin  5,000 Units Subcutaneous Q8H  . influenza  inactive virus vaccine  0.5 mL Intramuscular Tomorrow-1000  . interferon beta-1b  0.25 mg Subcutaneous QODAY  . levothyroxine  112 mcg Oral Daily  . metoprolol succinate  50 mg Oral Daily  . pantoprazole  40 mg Oral BID AC  . pantoprazole  40 mg Oral Once  . polyethylene glycol  17 g Oral Daily  . polyethylene glycol  17 g Oral BID  . potassium citrate  10 mEq Oral TID WC  . pyridOXINE  100 mg Oral Daily  . rOPINIRole  1 mg Oral TID AC & HS  . sertraline  150 mg Oral Daily  . traZODone  150 mg Oral QHS  . DISCONTD: pantoprazole  80 mg Oral Q1200     Assessment/Plan: s/p Procedure(s): LAPAROSCOPIC VENTRAL HERNIA INSERTION OF MESH  POD  #3. Stable. Improving. Await resolution of ileus  Advanced to regular diet  Decrease IV rate, possible remove the later. Discontinue morphine. Use Vicodin only for pain. MiraLAX BID today Probable discharge home tomorrow.   Multiple comorbidities:  Morbid obesity  Anxiety and depression on multiple psychotropic drugs  History atrial tachycardia  History of cholecystectomy hiatal hernia repair  Multiple sclerosis       LOS: 3 days    Kuron Docken M. Derrell Lolling, M.D., [facility] General and Minimally invasive Surgery Breast and Colorectal Surgery Office:   [phone] Pager:   [phone]  07/28/2012  . .prob

## 2012-07-29 MED ORDER — HYDROCODONE-ACETAMINOPHEN 5-325 MG PO TABS: 1.0000 | ORAL_TABLET | ORAL | Status: DC | PRN

## 2012-07-29 NOTE — Discharge Summary (Signed)
Patient ID: Jocelyn Wilson [id number] 60 y.o. [birth year]  07/25/2012  Discharge date and time: July 29, 2012  Admitting Physician: Jocelyn Wilson  Discharge Physician: Jocelyn Wilson  Admission Diagnoses: ventral hernia  Discharge Diagnoses: Incarcerated ventral hernias, multiple  Operations: Procedure(s): LAPAROSCOPIC VENTRAL HERNIA INSERTION OF MESH  Admission Condition: good  Discharged Condition: good  Indication for Admission: Jocelyn Wilson is a 61 y.o. female.  This patient underwent a major laparotomy through a midline incision in 1984 and underwent cholecystectomy and hiatal hernia repair in [place]. She states she has had a bulge just to the left of the midline for about 3-4 years. She has some of pain and a bulge. . She's never had a bowel obstruction. She denies any other abdominal surgeries.  Colonoscopy 2010.Marland Kitchen  Recent CT scan shows multiple incisional hernia defects containing fatty tissue. These are all of from just above the umbilicus and up the midline. There is one large, one medium size and one tiny hernia. No other hernias are noted. There is no entrapped intestine or other abnormality.  We talked along from about what would be involved in repairing his hernia. Hopefully this can be done laparoscopically, assuming we can reduce the omentum.  We discussed numerous risks, including bleeding, infection, recurrence of the hernia, injury to the intestine, cardiac and pulmonary complications.   She wants to have the surgery because of the pain and bulge. She understands the natural history of the hernia is to slowly progress and possibly involve the intestine later in life.  She sawDr. Eldridge Wilson Sept. 10 for cardiac clearance.  She has numerous comorbidities including obesity, hyperlipidemia, hypothyroidism, multiple sclerosis, restless leg syndrome, intermittent atrial tachycardia followed by cardiology, gastroesophageal disease, depression anxiety, history of  kidney stones.     Hospital Course: On the day of admission the patient was taken to the operating room. She underwent laparoscopy, extensive lysis of adhesions, I was able to reduce all the omentum and incarcerated contents from her hernias and repair this with a very large piece of Ventra-lite  mesh, 25 cm x 33 cm. This provided very wide in- lay coverage. Postoperatively she did fairly well. She had an ileus and was slow to move  the first day or 2 but with encouragement ambulation and MiraLAX she advanced to a regular diet began having bowel movements and was ready to go home on October 19. Her abdomen is soft but obese. The wounds look good. Vital signs are stable. She is comfortable. She was given prescription for Vicodin. She was asked to return to see me in the office in 6 days to get her staples removed. She will resume all her usual medications.  Consults: None  Significant Diagnostic Studies: None  Treatments: surgery: Laparoscopic lysis of adhesions, laparoscopic repair incarcerated ventral hernias with mesh  Disposition: Home  Patient Instructions:   Jocelyn, Wilson  Home Medication Instructions ZHY:[id number]   Printed on:07/29/12 0614  Medication Information                    traZODone (DESYREL) 100 MG tablet Take 150 mg by mouth at bedtime.           rOPINIRole (REQUIP) 0.25 MG tablet Take 1 mg by mouth QID.            sertraline (ZOLOFT) 100 MG tablet Take 150 mg by mouth daily.           interferon beta-1b (BETASERON) 0.3 MG injection Inject 0.25 mg into the skin every  other day.           metoprolol succinate (TOPROL-XL) 50 MG 24 hr tablet Take 50 mg by mouth daily. Take with or immediately following a meal.           esomeprazole (NEXIUM) 40 MG capsule Take 40 mg by mouth 2 (two) times daily.            atorvastatin (LIPITOR) 20 MG tablet Take 20 mg by mouth daily.           pyridOXINE (VITAMIN B-6) 100 MG tablet Take 100 mg by mouth daily.             Ergocalciferol (VITAMIN D2) 2000 UNITS TABS Take 2,000 Units by mouth daily.            EPINEPHrine (EPIPEN) 0.3 mg/0.3 mL DEVI Inject 0.3 mg into the muscle as needed.            Pyridoxine HCl (VITAMIN B-6 PO) Take 1 tablet by mouth daily.           Ibuprofen (ADVIL) 200 MG CAPS Take 2 capsules by mouth 3 (three) times daily as needed. For migraines           potassium citrate (UROCIT-K) 10 MEQ (1080 MG) SR tablet Take 10 mEq by mouth 3 (three) times daily with meals.           busPIRone (BUSPAR) 30 MG tablet Take 30 mg by mouth 3 (three) times daily.           ferrous sulfate 325 (65 FE) MG tablet Take 325 mg by mouth 2 (two) times daily.           levothyroxine (SYNTHROID, LEVOTHROID) 112 MCG tablet Take 112 mcg by mouth daily.           ranitidine (ZANTAC) 75 MG tablet Take 75 mg by mouth at bedtime.           HYDROcodone-acetaminophen (NORCO/VICODIN) 5-325 MG per tablet Take 1-2 tablets by mouth every 4 (four) hours as needed for pain.             Activity: activity as tolerated. No driving for 2-13 days. No lifting more than 20 pounds for 5-6 weeks. Ambulate daily. Diet: low fat, low cholesterol diet Wound Care: none needed  Follow-up:  With Jocelyn Wilson in 6 days.  Signed: Angelia Wilson. Derrell Wilson, M.D., FACS General and minimally invasive surgery Breast and Colorectal Surgery  07/29/2012, 6:14 AM

## 2012-07-29 NOTE — Progress Notes (Signed)
Orthopedic Tech Progress Note Patient Details:  Sharen Youngren May 28, 1951 [id number]  Ortho Devices Type of Ortho Device: Abdominal binder Ortho Device/Splint Location: REPLACEMENT ABDOMINAL BINDER Ortho Device/Splint Interventions: Ordered   Shawnie Pons 07/29/2012, 8:48 AM

## 2012-07-29 NOTE — Progress Notes (Signed)
4 Days Post-Op  Subjective: Stable and alert. Tolerating regular diet. Passing lots of flatus. One small stool. Begging to go home. No complaints.  Objective: Vital signs in last 24 hours: Temp:  [98 F (36.7 C)-98.4 F (36.9 C)] 98.2 F (36.8 C) (10/18 2130) Pulse Rate:  [67-79] 76  (10/18 2130) Resp:  [16-18] 18  (10/18 2130) BP: (118-143)/(52-76) 118/58 mmHg (10/18 2130) SpO2:  [95 %-96 %] 95 % (10/18 2130) Last BM Date: 07/24/12  Intake/Output from previous day: 10/18 0701 - 10/19 0700 In: 600 [P.O.:600] Out: -  Intake/Output this shift:    General appearance:Alert. Pleasant. No distress. Mental status normal. GI: Abdomen soft. Obese. Minimally tender. All wounds look fine.  Lab Results:  No results found for this or any previous visit (from the past 24 hour(s)).   Studies/Results: @RISRSLT24 @     . busPIRone  30 mg Oral TID  . famotidine  20 mg Oral Daily  . heparin  5,000 Units Subcutaneous Q8H  . interferon beta-1b  0.25 mg Subcutaneous QODAY  . levothyroxine  112 mcg Oral Daily  . metoprolol succinate  50 mg Oral Daily  . pantoprazole  40 mg Oral BID AC  . polyethylene glycol  17 g Oral Daily  . polyethylene glycol  17 g Oral BID  . potassium citrate  10 mEq Oral TID WC  . pyridOXINE  100 mg Oral Daily  . rOPINIRole  1 mg Oral TID AC & HS  . sertraline  150 mg Oral Daily  . traZODone  150 mg Oral QHS     Assessment/Plan: s/p Procedure(s): LAPAROSCOPIC VENTRAL HERNIA INSERTION OF MESH\  POD #4. Stable. Meets discharge criteria.  Discharge home today Diet and activities discussed. Return to office next Thursday or Friday for staple removal .  Multiple comorbidities:  Morbid obesity  Anxiety and depression on multiple psychotropic drugs  History atrial tachycardia  History of cholecystectomy hiatal hernia repair  Multiple sclerosis       LOS: 4 days    Aayden Cefalu M 07/29/2012  . .prob

## 2012-07-29 NOTE — Progress Notes (Signed)
Night nurse did dc instructions and home meds with patient. Also gave her prescriptions. Patient received abdominal binder to take home. Her other one was stained with old drainage. Patient did not need any more instructions at this time.

## 2012-07-31 ENCOUNTER — Telehealth (INDEPENDENT_AMBULATORY_CARE_PROVIDER_SITE_OTHER): Payer: Self-pay

## 2012-07-31 NOTE — Telephone Encounter (Addendum)
Suture removal appointment made for Wed 07/31/12 @ 10:30 am

## 2012-08-02 ENCOUNTER — Ambulatory Visit (INDEPENDENT_AMBULATORY_CARE_PROVIDER_SITE_OTHER): Payer: BC Managed Care – PPO | Admitting: General Surgery

## 2012-08-02 VITALS — BP 130/76 | HR 70 | Temp 98.2°F | Resp 76 | Ht <= 58 in | Wt 205.5 lb

## 2012-08-02 DIAGNOSIS — Z4802 Encounter for removal of sutures: Secondary | ICD-10-CM

## 2012-08-02 NOTE — Patient Instructions (Signed)
When you shower please use antibacterial soap. Do not rub the incision sites, allow the soap the run over the incision sites. Allow the steri strips that have been applied to come off naturally, do not pull at any scabs that may be on the incisions you have. Let them come off on their own.  Please look our for signs of infection such an foul odor, pus, excessive drainage from the incision sites or fever. If these occur please contact our office at [phone].   You have been scheduled to see Dr. Derrell Lolling post op on 08/17/12 at 8:15 where he will discuss your post op status and answer any questions you may have at that time.

## 2012-08-02 NOTE — Progress Notes (Signed)
Patient presented today for a nurse only visit for staple removal s/p ventral hernia repair with mesh on 07/25/12 by Dr. Derrell Lolling. Patient had 13 incision sites with a total of 19 staples to be removed. Staples were removed without complication. Incision sites did not show any sign of infection or discharge. Incisions sites appeared well healed and approximated. Steri strips were applied to all incision sites in order to allow healing process to continue. Only one incision site on the left quadrant of abdomin required 3 steri strips to ensure sites remain closed.   Patient advised to use antibacterial soap to keep the area clean. Advised of what signs of infection to look out for. Patient set up with appointment to see Dr. Derrell Lolling post op on 08/17/12 at 8:15. Patient confirmed she kept avs issued during pre-op visit that detailed instructions as to what to look for and when to call our office if complications arise.

## 2012-08-17 ENCOUNTER — Encounter (INDEPENDENT_AMBULATORY_CARE_PROVIDER_SITE_OTHER): Payer: Self-pay | Admitting: General Surgery

## 2012-08-17 ENCOUNTER — Ambulatory Visit (INDEPENDENT_AMBULATORY_CARE_PROVIDER_SITE_OTHER): Payer: BC Managed Care – PPO | Admitting: General Surgery

## 2012-08-17 VITALS — BP 132/74 | HR 72 | Temp 97.3°F | Resp 16 | Ht 60.0 in | Wt 202.5 lb

## 2012-08-17 DIAGNOSIS — K430 Incisional hernia with obstruction, without gangrene: Secondary | ICD-10-CM

## 2012-08-17 NOTE — Progress Notes (Signed)
Patient ID: Jocelyn Wilson, female   DOB: [birth year], 61 y.o.   MRN: [id number] History: Patient underwent laparoscopic lyses of adhesions and laparoscopic ventral hernia repair with 25 cm x 33 cm piece of ventraLite mesh on 07/25/2012. She has already started driving her car. Appetite is reasonable. Bowel function is normal. She has some tenderness at different areas of her wound, nothing unusual  Exam: Patient looks well. In no distress. Husband with her. Abdomen obese. Soft. Minimal tenderness. Small seroma to the left of the midline in the old hernia sac this is probably about 6 cm in size. No signs of infection.  Assessment: Complex, incarcerated ventral hernia, multiple. Recovering uneventfully in the early postop period following laparoscopic lysis of adhesions and laparoscopic repair with mesh Small seroma and hernia sac, expect this will be self-limited  Plan: Diet and activities discussed. 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   Angelia Mould. Derrell Lolling, M.D., [facility] General and Minimally invasive Surgery Breast and Colorectal Surgery Office:   [phone] Pager:   [phone]

## 2012-08-17 NOTE — Patient Instructions (Signed)
Your incisions are healing normally without any sign of surgical complication. You have a small fluid collection, called a seroma, which should resolve over the next few months.  Take a long walk daily.  You may return to work between 4 and 6 weeks from the date of the surgery.  No sports or heavy lifting for 6 weeks from the date of surgery  Return to see Dr. Derrell Lolling in 6 weeks.

## 2012-08-28 ENCOUNTER — Telehealth (INDEPENDENT_AMBULATORY_CARE_PROVIDER_SITE_OTHER): Payer: Self-pay | Admitting: General Surgery

## 2012-08-28 ENCOUNTER — Encounter (INDEPENDENT_AMBULATORY_CARE_PROVIDER_SITE_OTHER): Payer: Self-pay | Admitting: General Surgery

## 2012-08-28 NOTE — Telephone Encounter (Signed)
Called patient to advise message was received regarding her request for a return to work note. Patient requested for letter to be mailed to her directly and she will copy and forward to the appropriate department for their records. Patient asked about the slight burning, twinge sensations that she feels. Advised considering the insertion of mesh and permanent sutures and extent of hernia repair that it is normal for those sensations as the nerves take time to regenerate. Patient said the sensation can be annoying but wanted to make sure it was normal.

## 2012-09-13 ENCOUNTER — Telehealth (INDEPENDENT_AMBULATORY_CARE_PROVIDER_SITE_OTHER): Payer: Self-pay

## 2012-09-13 NOTE — Telephone Encounter (Signed)
Pt called wanting to know if it is normal for her to still have firmness and soreness at hernia site. Pt states no redness or fever in area of repair. She states the post op swelling from her seroma is slowly going down but she wanted to be sure soreness is to be expected. Pt also states she has been slow returning to normal activities because she has a herniated lumbar disc and cannot walk and stretch as much as she was advised to do by Dr Derrell Lolling. I advised pt she may notice soreness and some firmness at hernia site for several months after surgery. She was advised it takes time for tissue to heal and soften. Pt offered appt to see Dr Derrell Lolling. Pt states she prefers to wait but will call if soreness and swelling does not continue to improve or if redness and or fever occur.

## 2012-09-28 ENCOUNTER — Ambulatory Visit (INDEPENDENT_AMBULATORY_CARE_PROVIDER_SITE_OTHER): Payer: BC Managed Care – PPO | Admitting: General Surgery

## 2012-09-28 ENCOUNTER — Encounter (INDEPENDENT_AMBULATORY_CARE_PROVIDER_SITE_OTHER): Payer: Self-pay | Admitting: General Surgery

## 2012-09-28 VITALS — BP 138/80 | HR 96 | Temp 96.7°F | Resp 18

## 2012-09-28 DIAGNOSIS — K430 Incisional hernia with obstruction, without gangrene: Secondary | ICD-10-CM

## 2012-09-28 DIAGNOSIS — IMO0002 Reserved for concepts with insufficient information to code with codable children: Secondary | ICD-10-CM

## 2012-09-28 NOTE — Progress Notes (Signed)
Patient ID: Jocelyn Wilson, female   DOB: 10/16/1950, 61 y.o.   MRN: [id number] History: This patient underwent laparoscopic lysis of adhesions and laparoscopic repair of multiple ventral hernias with a 25 x 33 cm Ventralite mesh. Date of surgery 07/25/2012. She is feeling fine and doesn't have any pain. She still has a lump in her incision.  Exam: Patient looks well. No distress. Abdomen obese. Soft. Nontender. All trocar sites well healed. There is a palpable smooth seroma just above the umbilicus. This is probably 5 cm in diameter. Targeted ultrasound of this area shows a homogenous fluid density without any shadowing. No air. Consistent with seroma.  Assessment: Uncomplicated wound seroma. Complex ventral hernia, otherwise recovering uneventfully following laparoscopic repair with mesh Multiple sclerosis. Obesity Hypertension History depression  Plan: Return to see me in one month. If the seroma persists we will perform needle aspiration under sterile technique. No restriction of physical activities from here on out.   Angelia Mould. Derrell Lolling, M.D., [facility] General and Minimally invasive Surgery Breast and Colorectal Surgery Office:   [phone] Pager:   [phone]

## 2012-09-28 NOTE — Patient Instructions (Signed)
The ultrasound today shows that he have an uncomplicated seroma in the center of your hernia repair. This is not unusual and usually will resolve without any further surgery.  You may exercise. There is no limitation on your activities from here on out.  Return to see Dr. Derrell Lolling in approximately one month. If the seroma is still present we will perform a needle aspiration at that time.

## 2012-11-10 ENCOUNTER — Encounter (INDEPENDENT_AMBULATORY_CARE_PROVIDER_SITE_OTHER): Payer: Self-pay | Admitting: General Surgery

## 2012-11-10 ENCOUNTER — Ambulatory Visit (INDEPENDENT_AMBULATORY_CARE_PROVIDER_SITE_OTHER): Payer: BC Managed Care – PPO | Admitting: General Surgery

## 2012-11-10 VITALS — BP 132/86 | HR 80 | Temp 99.2°F | Resp 16 | Ht <= 58 in | Wt 199.8 lb

## 2012-11-10 DIAGNOSIS — K430 Incisional hernia with obstruction, without gangrene: Secondary | ICD-10-CM

## 2012-11-10 NOTE — Progress Notes (Signed)
Patient ID: Jocelyn Wilson, female   DOB: 1950-12-06, 62 y.o.   MRN: [id number] History: This patient returns for followup regarding her ventral hernia repair. She also states that she has a skin tag below her left breast she would like removed. This patient underwent laparoscopic lysis of adhesions and laparoscopic ventral hernia repair with a 25 x 33 cm piece of ventral light mesh.   Date of surgery 07/25/2012. She was last seen in the office on 08/17/2012 at which time she was doing well but did have a seroma that we noted an ultrasound. She states that she is feeling well has no pain. 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  Exam: Patient looks well. In no distress. Small skin tag below left breast inframammary crease. This was prepped with alcohol and conservatively excised with a knife and redressed with Neosporin and Band-Aid. Abdominal exam shows soft, nontender, hernia repair intact. 5-6 cm seroma again noted. Prepped with alcohol, anesthetized with 1% Xylocaine, aspirated 50 cc of clear serous fluid in the seroma was resolved by palpation. Tolerated well.  Assessment: Ventral incisional hernia, recovering without signs of infection or recurrence 3-1/2 months postop Superficial wound seroma, aspirated today Skin tag left chest wall, below the inframammary crease, excised today Morbid obesity Multiple sclerosis  Plan: Wound care discussed Weight loss encouraged Return to see me if further problems arise.     Angelia Mould. Derrell Lolling, M.D., [facility] General and Minimally invasive Surgery Breast and Colorectal Surgery Office:   [phone] Pager:   [phone]

## 2012-11-10 NOTE — Patient Instructions (Signed)
Your hernia repair is intact. You may pursue normal activities.  Weight loss is strongly recommended.  You had a small, sterile fluid collection called a seroma. We aspirated this and  it is completely gone. If this recurs, call my office and we will bring you back in for reevaluation.  We also removed a skin tag below your left breast today. Keep a Band-Aid and some Neosporin ointment on this for about one week.  You may shower  Return to see Dr. Derrell Lolling if further problems arise.

## 2012-12-05 ENCOUNTER — Encounter (INDEPENDENT_AMBULATORY_CARE_PROVIDER_SITE_OTHER): Payer: BC Managed Care – PPO | Admitting: General Surgery

## 2012-12-07 ENCOUNTER — Encounter (INDEPENDENT_AMBULATORY_CARE_PROVIDER_SITE_OTHER): Payer: Self-pay | Admitting: General Surgery

## 2012-12-07 ENCOUNTER — Ambulatory Visit (INDEPENDENT_AMBULATORY_CARE_PROVIDER_SITE_OTHER): Payer: BC Managed Care – PPO | Admitting: General Surgery

## 2012-12-07 VITALS — BP 128/80 | HR 74 | Temp 97.8°F | Resp 16 | Ht <= 58 in | Wt 202.4 lb

## 2012-12-07 DIAGNOSIS — Z5189 Encounter for other specified aftercare: Secondary | ICD-10-CM

## 2012-12-07 DIAGNOSIS — K430 Incisional hernia with obstruction, without gangrene: Secondary | ICD-10-CM

## 2012-12-07 NOTE — Progress Notes (Signed)
Patient ID: Jocelyn Wilson, female   DOB: [birth year], 62 y.o.   MRN: [id number] History: This patient underwent laparoscopic lysis of adhesions, extensive, and laparoscopic repair of multiple ventral hernias with mesh, utilizing 2533 cm ventral I. The date of surgery 07/25/2013. She developed a Small wound seroma which is sterile but does cause some discomfort. On 11/11/2011 I aspirated 50 cc of seroma fluid. She states that this has recurred it is a little bit uncomfortable. Comorbidities include obesity, multiple sclerosis, history of multiple abdominal operations.  Exam: Patient looks well. Friendly as usual. Abdomen obese. Soft. All wounds well healed. Small seroma just left of midline. This feels smaller. Procedure, prepped with alcohol, anesthetized with 1% Xylocaine, aspirated 20 cc of clear serous fluid and the mass resolved. Tolerated well  Assessment:Wound seroma, recurrent. Hopefully this will resolve with a few aspirations, and we can avoid the risk of a drain placement Multiple sclerosis Obesity History complex ventral incisional hernia repair with mesh.  Plan: Return to see me in one month.   Angelia Mould. Derrell Lolling, M.D., [facility] General and Minimally invasive Surgery Breast and Colorectal Surgery Office:   [phone] Pager:   [phone]

## 2012-12-07 NOTE — Patient Instructions (Signed)
Today we aspirated 20 cc of fluid  from the wound and it completely collapsed. This is less than last time.  Return to see Dr. Derrell Lolling in one month.

## 2012-12-11 ENCOUNTER — Telehealth (INDEPENDENT_AMBULATORY_CARE_PROVIDER_SITE_OTHER): Payer: Self-pay | Admitting: General Surgery

## 2012-12-11 NOTE — Telephone Encounter (Signed)
Called patient back after Dr. Derrell Lolling returned page sent. Per Dr. Derrell Lolling he wants her to wait a week before re-evaluation and removal of seroma (fluid) in order to keep risk of infection down. Patient advised that if the pain increases before then to call our office. Patient agreed.

## 2012-12-11 NOTE — Telephone Encounter (Signed)
Message received from front desk [facility]): "Patient stated that the seroma has come back and it is very painful. She wanted you to be aware of this. Please call to discuss. Thank you."  Called patient back based on message left with front desk. Patient stated the seroma came back same day. She is having discomfort and it bothers her when she bend or moves a certain way. Patient stated she does not have any fever, no signs of infection present. I offered for the patient to be added to today's schedule, however, patient stated that she was concerned about the weather and also that it may come back quickly since she was just seen on 12/07/12. I advised the patient that I would send a message to Dr. Derrell Lolling to advise of situation and when he feels this patient should be seen. Advised patient of other dates this week he is in the office and advised we would add her if necessary to make sure she is healing properly.

## 2013-01-11 ENCOUNTER — Ambulatory Visit (INDEPENDENT_AMBULATORY_CARE_PROVIDER_SITE_OTHER): Payer: BC Managed Care – PPO | Admitting: General Surgery

## 2013-01-11 ENCOUNTER — Encounter (INDEPENDENT_AMBULATORY_CARE_PROVIDER_SITE_OTHER): Payer: Self-pay | Admitting: General Surgery

## 2013-01-11 VITALS — BP 121/66 | HR 70 | Temp 98.6°F | Resp 14 | Ht <= 58 in | Wt 202.0 lb

## 2013-01-11 DIAGNOSIS — K430 Incisional hernia with obstruction, without gangrene: Secondary | ICD-10-CM

## 2013-01-11 NOTE — Patient Instructions (Signed)
We were able to aspirate only 20 cc of fluid from the seroma. There is no sign of infection.  Return to see Dr. Derrell Lolling in approximately 6 weeks.

## 2013-01-11 NOTE — Progress Notes (Signed)
Patient ID: Jocelyn Wilson, female   DOB: 1951-07-07, 62 y.o.   MRN: [id number] History: This patient underwent laparoscopic lysis of adhesions, extensive, and laparoscopic repair of multiple ventral hernias with mesh, utilizing 2533 cm ventraLite mesh.  The date of surgery was 07/25/2013.  She developed a small wound seroma which is sterile but does cause some discomfort. On 11/11/2011 I aspirated 50 cc of seroma fluid. On 12/07/2012 I aspirated 20 cc.  She states that this has recurred and  is a little bit uncomfortable.  Comorbidities include obesity, multiple sclerosis, history of multiple abdominal operations.   Exam: Patient looks well. Friendly as usual.  Abdomen obese. Soft. All wounds well healed. Small seroma just left of midline. This feels smaller.  Procedure, prepped with alcohol, anesthetized with 1% Xylocaine, aspirated 20 cc of clear serosanguinous  fluid and the mass resolved. Tolerated well   Assessment:Wound seroma, recurrent. Hopefully this will resolve with a few aspirations, and we can avoid the risk of a drain placement  Multiple sclerosis  Obesity  History complex ventral incisional hernia repair with mesh.   Plan: Return to see me in one month.   Angelia Mould. Derrell Lolling, M.D., [facility] General and Minimally invasive Surgery Breast and Colorectal Surgery Office:   [phone] Pager:   [phone]

## 2013-02-26 ENCOUNTER — Telehealth (INDEPENDENT_AMBULATORY_CARE_PROVIDER_SITE_OTHER): Payer: Self-pay

## 2013-02-26 NOTE — Telephone Encounter (Signed)
I called the pt back and she states she can't go to work and then leave.  She starts work at [employer] and could go in a little late.  I told her I don't have anything to reschedule her to but she can get here at 0830 when we start and we will see her early.

## 2013-02-26 NOTE — Telephone Encounter (Signed)
Message copied by Ivory Broad on Mon Feb 26, 2013  9:27 AM ------      Message from: Louie Casa      Created: Fri Feb 23, 2013  3:21 PM      Contact: [phone]       Patient has appointment on Tuesday 02/27/2013 @10 :30am but needs first in the morning       (Tuesday or Wednesday),  Please call her. ------

## 2013-02-27 ENCOUNTER — Encounter (INDEPENDENT_AMBULATORY_CARE_PROVIDER_SITE_OTHER): Payer: Self-pay | Admitting: General Surgery

## 2013-02-27 ENCOUNTER — Ambulatory Visit (INDEPENDENT_AMBULATORY_CARE_PROVIDER_SITE_OTHER): Payer: BC Managed Care – PPO | Admitting: General Surgery

## 2013-02-27 VITALS — BP 130/82 | HR 72 | Temp 98.3°F | Resp 18 | Ht <= 58 in | Wt 200.0 lb

## 2013-02-27 DIAGNOSIS — K430 Incisional hernia with obstruction, without gangrene: Secondary | ICD-10-CM

## 2013-02-27 NOTE — Progress Notes (Signed)
Patient ID: Jocelyn Wilson, female   DOB: 1950-11-12, 62 y.o.   MRN: [id number] History: This patient underwent extensive laparoscopic lysis of adhesions, and laparoscopic repair of multiple ventral hernias with mesh on 07/25/2013. She is basically doing well but developed a wound seroma which has been aspirated a few times. The amount of fluid has been declining.  She is fairly active. She knows that she needs to lose weight. She has occasional discomfort and burning sensation in the incision but no bulges. She says she has a small bulge at the seroma site..  Exam: Patient looks well. Friendly his usual Abdomen: Obese. Soft. Objective and nontender. Small seroma just left of midline. This feels smaller. Procedure: Prepped with alcohol anesthetized with 1% Xylocaine. Aspirated 8 cc of bloody watery seroma fluid and the mass resolved. Tolerated well.  Assessment: Wound seroma, recurrent. This is resolving. No indication for drain placement History complex ventral incisional hernia repair with mesh. Repair intact Multiple sclerosis Obesity  Plan: I told her I was inclined not to aspirate this anymore because of the risk of infection and she agrees with that She was advised to lose weight and to work with her primary care physician on that. Return to see me if further problems arise.   Angelia Mould. Derrell Lolling, M.D., [facility] General and Minimally invasive Surgery Breast and Colorectal Surgery Office:   [phone] Pager:   [phone]

## 2013-02-27 NOTE — Patient Instructions (Signed)
All of your wounds have healed well, and the hernia repair is intact.  We aspirated 8 cc of bloody watery fluid from the seroma cavity and this seems to have taken care of it.  Return to see Dr. Derrell Lolling if further problems arise.

## 2013-08-16 ENCOUNTER — Other Ambulatory Visit: Payer: Self-pay

## 2013-10-12 ENCOUNTER — Other Ambulatory Visit: Payer: Self-pay | Admitting: Interventional Cardiology

## 2013-12-15 ENCOUNTER — Encounter: Payer: Self-pay | Admitting: *Deleted

## 2014-02-21 ENCOUNTER — Other Ambulatory Visit (HOSPITAL_COMMUNITY)
Admission: RE | Admit: 2014-02-21 | Discharge: 2014-02-21 | Disposition: A | Discharge status: Home / Self Care | Payer: BC Managed Care – PPO | Source: Ambulatory Visit | Attending: Family Medicine | Admitting: Family Medicine

## 2014-02-21 ENCOUNTER — Other Ambulatory Visit: Payer: Self-pay | Admitting: Family Medicine

## 2014-02-21 DIAGNOSIS — Z1151 Encounter for screening for human papillomavirus (HPV): Secondary | ICD-10-CM | POA: Insufficient documentation

## 2014-02-21 DIAGNOSIS — Z124 Encounter for screening for malignant neoplasm of cervix: Secondary | ICD-10-CM | POA: Insufficient documentation

## 2014-06-20 ENCOUNTER — Other Ambulatory Visit: Payer: Self-pay | Admitting: Interventional Cardiology

## 2014-09-18 ENCOUNTER — Other Ambulatory Visit: Payer: Self-pay | Admitting: Interventional Cardiology

## 2014-10-25 NOTE — Telephone Encounter (Signed)
OK to refill x 1.  I should see the patient at some point in the next few months or she can have PCP fill if she is doing well on the current regimen, and not come in for visit.

## 2014-10-28 ENCOUNTER — Other Ambulatory Visit: Payer: Self-pay

## 2014-10-28 MED ORDER — METOPROLOL SUCCINATE ER 50 MG PO TB24: 50.0000 mg | ORAL_TABLET | Freq: Every day | ORAL | Status: DC

## 2014-10-31 NOTE — Telephone Encounter (Signed)
  OK to refill x 1. I should see the patient at some point in the next few months or she can have PCP fill if she is doing well on the current regimen, and not come in for visit.

## 2014-11-18 ENCOUNTER — Telehealth: Payer: Self-pay

## 2014-11-18 ENCOUNTER — Other Ambulatory Visit: Payer: Self-pay | Admitting: Neurology

## 2014-11-18 NOTE — Telephone Encounter (Signed)
Per Faith, refills must be obtained through [facility] until patient is seen at [facility].  Please request refills from [facility] Fax [phone]

## 2014-11-18 NOTE — Telephone Encounter (Signed)
Patient calling for two weeks worth of Zoloft 100 mg called in to her local pharmacy  . Dr.  Felecia Shelling Patient. Stated to patient she has to call [facility] for a refill.  Relayed sorry for the inconvenience.

## 2014-11-26 ENCOUNTER — Ambulatory Visit: Payer: Self-pay | Admitting: Neurology

## 2014-11-27 DIAGNOSIS — N200 Calculus of kidney: Secondary | ICD-10-CM | POA: Insufficient documentation

## 2014-11-28 ENCOUNTER — Encounter: Payer: Self-pay | Admitting: Neurology

## 2014-11-28 ENCOUNTER — Other Ambulatory Visit: Payer: Self-pay | Admitting: Neurology

## 2014-11-28 ENCOUNTER — Ambulatory Visit (INDEPENDENT_AMBULATORY_CARE_PROVIDER_SITE_OTHER): Payer: BLUE CROSS/BLUE SHIELD | Admitting: Neurology

## 2014-11-28 DIAGNOSIS — M545 Low back pain, unspecified: Secondary | ICD-10-CM

## 2014-11-28 DIAGNOSIS — M5416 Radiculopathy, lumbar region: Secondary | ICD-10-CM

## 2014-11-28 DIAGNOSIS — F32A Depression, unspecified: Secondary | ICD-10-CM

## 2014-11-28 DIAGNOSIS — R260 Ataxic gait: Secondary | ICD-10-CM

## 2014-11-28 DIAGNOSIS — G35 Multiple sclerosis: Secondary | ICD-10-CM

## 2014-11-28 DIAGNOSIS — M47817 Spondylosis without myelopathy or radiculopathy, lumbosacral region: Secondary | ICD-10-CM | POA: Insufficient documentation

## 2014-11-28 DIAGNOSIS — G2581 Restless legs syndrome: Secondary | ICD-10-CM

## 2014-11-28 DIAGNOSIS — G43009 Migraine without aura, not intractable, without status migrainosus: Secondary | ICD-10-CM

## 2014-11-28 DIAGNOSIS — F411 Generalized anxiety disorder: Secondary | ICD-10-CM

## 2014-11-28 DIAGNOSIS — F329 Major depressive disorder, single episode, unspecified: Secondary | ICD-10-CM

## 2014-11-28 MED ORDER — RIZATRIPTAN BENZOATE 5 MG PO TBDP: 5.0000 mg | ORAL_TABLET | ORAL | Status: DC | PRN

## 2014-11-28 MED ORDER — METOPROLOL SUCCINATE ER 50 MG PO TB24: 50.0000 mg | ORAL_TABLET | Freq: Every day | ORAL | Status: DC

## 2014-11-28 NOTE — Progress Notes (Signed)
[facility]  PATIENT: Jocelyn Wilson DOB: 1950/11/25  REFERRING DOCTOR OR PCP:  Adaku Nnodi SOURCE: Patient  _________________________________   HISTORICAL  CHIEF COMPLAINT:  Chief Complaint  Patient presents with  . Multiple Sclerosis    Sts. she tolerates Betaseron well.  Sts. balance is some worse--has had mult. falls.    . Back Pain    Sts. back pain is much improved with physical therapy/fim    HISTORY OF PRESENT ILLNESS:  Jocelyn Wilson is a 64 year old woman with multiple sclerosis, back pain with sciatica, and migraines.  Her MS was diagnosed more than a decade ago.    She was actually seeing me for migraine headache when she presented with optic neuritis and vertigo and had another MRI.   She had interval development of multiple new foci and underwent a lumbar puncture.  CSF was also consistent with MS.   She was placed on Betaseron and continues on that medication with good control of the MS. Had definite exacerbations. She has stable intermittent tingling and mild decreased balance. She notes that she veers as she walks it sometimes needs to grab a wall for balance. She will get vertigo. She denies any numbness in her legs.   The last MRI of the brain was performed on 08/17/2009. He was abnormal showing periventricular white matter foci consistent with multiple sclerosis. When compared to prior MRI dated 08/26/2007, there were no new lesions. The older scan also showed a small left medullary focus that was not observed on the more recent study.  She has had some other symptoms from her MS including urinary frequency. She denies any hesitancy. She empties her bladder. She has not had urinary tract infections. She also reports fatigue despite sleeping better at night with trazodone. Provigil has helped her fatigue some.  She has some difficulty with gait and has fallen a few times in last 6 months.    She cannot balance well enough to carry items when  she walks.     She has had some sleep issues including restless leg syndrome. Ropinirole has helped the uncomfortable sensation and she tolerates it well. She snores and is always tired. She has not had witnessed sleep apnea. She does not want to check a PSG and she would not wear CPAP if she was diagnosed with OSA.  She has some fatigue but feels it is not as bad as last year. She was able to stop the modafinil. She did not think it helped her that much.  She has a chronic mild depression and often feels sad with occasional crying. She takes sertraline, BuSpar and alprazolam.   She notes some cognitive issues.   She has more difficulty navigating and has gotten lost.   Mild short term memory issues and difficulty multi-tasking.     She has had a lot of issues with back and leg pain.   I reviewed the result of an MRI of the lumbar spine dated 01/23/2011. It shows severe degenerative disc disease and facet hypertrophy at L4-L5. She has had several pain management injection procedures.  She has had radiofrequency ablations of the medial branch nerves with benefit. She also has had ESI's, SI joint injections, piriformis muscles injections with less benefit.   She had RFA of the medial branch nerves 08/2014 with some benefit and got much more benefit after also doing PT 11/15 - 1/16.   She was felt to  Have sciatica due to muscle spasms.  She has had neck pain and some numbness in the hands.  I reviewed a report from an MRI of the cervical spine dated 01/29/2012. MRI of the cervical spine shows a left lateral disc bulge C5-C6 with possible dynamic impingement of the exiting left C6 nerve root.  There appears to be thickening of the posterior longitudinal ligament throughout the upper cervical spine.  She also reports migraine headaches.    She has a mild HA daily that intensifies at times.   When headache intensifies, she uses a peppermint/lavender smell stick with benefit.   This helps mild HA bit not  severe HA.   When HA is more severe (often when she wakes up with one) she takes NSAID's.    When severe, she has throbbing pain with photophobia and mild nausea.   In the past she took Imitrex but it started causing a lot of Nausea and she stopped.     She has a long history of depression and anxiety. These issues are fairly stable gets some benefit from her medications.  REVIEW OF SYSTEMS: Constitutional: No fevers, chills, sweats, or change in appetite Eyes: No visual changes, double vision, eye pain Ear, nose and throat: No hearing loss, ear pain, nasal congestion, sore throat Cardiovascular: No chest pain, palpitations Respiratory: No shortness of breath at rest or with exertion.   No wheezes GastrointestinaI: No nausea, vomiting, diarrhea, abdominal pain, fecal incontinence Genitourinary: No dysuria, urinary retention or frequency.  No nocturia. Musculoskeletal: No neck pain, back pain Integumentary: No rash, pruritus, skin lesions Neurological: as above Psychiatric: No depression at this time.  No anxiety Endocrine: No palpitations, diaphoresis, change in appetite, change in weigh or increased thirst Hematologic/Lymphatic: No anemia, purpura, petechiae. Allergic/Immunologic: No itchy/runny eyes, nasal congestion, recent allergic reactions, rashes  ALLERGIES: Allergies  Allergen Reactions  . Iron Anaphylaxis    Iv iron  . Ciprofloxacin     Stomach upset  . Contrast Media [Iodinated Diagnostic Agents] Itching    Rash and itching  . Sulfa Antibiotics     Rash and itching  . Tape Rash    Adhesive tape    HOME MEDICATIONS:  Current outpatient prescriptions:  .  ALPRAZolam (XANAX) 0.5 MG tablet, Take 0.5 mg by mouth., Disp: , Rfl:  .  atorvastatin (LIPITOR) 20 MG tablet, Take 20 mg by mouth daily., Disp: , Rfl:  .  busPIRone (BUSPAR) 30 MG tablet, Take 30 mg by mouth 3 (three) times daily., Disp: , Rfl:  .  EPINEPHrine (EPIPEN) 0.3 mg/0.3 mL DEVI, Inject 0.3 mg into the  muscle as needed. , Disp: , Rfl:  .  Ergocalciferol (VITAMIN D2) 2000 UNITS TABS, Take 2,000 Units by mouth daily. , Disp: , Rfl:  .  esomeprazole (NEXIUM) 40 MG capsule, Take 40 mg by mouth 2 (two) times daily. , Disp: , Rfl:  .  ferrous sulfate 325 (65 FE) MG tablet, Take 325 mg by mouth 2 (two) times daily., Disp: , Rfl:  .  Ibuprofen (ADVIL) 200 MG CAPS, Take 2 capsules by mouth 3 (three) times daily as needed. For migraines, Disp: , Rfl:  .  interferon beta-1b (BETASERON) 0.3 MG injection, Inject 0.25 mg into the skin every other day., Disp: , Rfl:  .  levothyroxine (SYNTHROID, LEVOTHROID) 150 MCG tablet, Take 150 mcg by mouth daily before breakfast., Disp: , Rfl:  .  metoprolol succinate (TOPROL-XL) 50 MG 24 hr tablet, Take 1 tablet (50 mg total) by mouth daily. Take with or immediately following a  meal., Disp: 30 tablet, Rfl: 0 .  potassium citrate (UROCIT-K) 10 MEQ (1080 MG) SR tablet, Take 10 mEq by mouth 3 (three) times daily with meals., Disp: , Rfl:  .  pyridOXINE (VITAMIN B-6) 100 MG tablet, Take 100 mg by mouth daily., Disp: , Rfl:  .  ranitidine (ZANTAC) 75 MG tablet, Take 75 mg by mouth at bedtime., Disp: , Rfl:  .  rOPINIRole (REQUIP) 0.25 MG tablet, Take 1 mg by mouth QID. , Disp: , Rfl:  .  sertraline (ZOLOFT) 100 MG tablet, Take 150 mg by mouth daily., Disp: , Rfl:  .  traZODone (DESYREL) 100 MG tablet, Take 150 mg by mouth at bedtime., Disp: , Rfl:  .  FLOVENT HFA 220 MCG/ACT inhaler, , Disp: , Rfl: 1 .  fluticasone (FLONASE) 50 MCG/ACT nasal spray, , Disp: , Rfl:  .  modafinil (PROVIGIL) 200 MG tablet, Daily., Disp: , Rfl:  .  Pyridoxine HCl (VITAMIN B-6 PO), Take 1 tablet by mouth daily., Disp: , Rfl:   PAST MEDICAL HISTORY: Past Medical History  Diagnosis Date  . GERD (gastroesophageal reflux disease)   . Diverticula of colon   . Multiple sclerosis   . Obesity   . Allergy   . Hyperlipidemia   . Anxiety   . Chronic kidney disease     KIDNEY STONES WITH STENTS  .  Eczema   . Thyroid disease     hyperthyroidism  . Restless leg syndrome   . Hypercholesterolemia   . Depression   . Migraine headache   . Kidney stones   . PONV (postoperative nausea and vomiting)   . Palpitations   . Hypothyroidism   . Snoring   . Vertigo   . Unsteady gait   . Vision abnormalities     PAST SURGICAL HISTORY: Past Surgical History  Procedure Laterality Date  . Cholecystectomy    . Lithotripsy    . Nissen fundoplication    . Finger surgery  x8  . Breast surgery      AUGMENTATION  . Hernia repair    . Carpel tunnel    . Dilation and curettage of uterus  1983  . Ventral hernia repair  07/25/2012    Procedure: LAPAROSCOPIC VENTRAL HERNIA;  Surgeon: Adin Hector, MD;  Location: [place];  Service: General;  Laterality: N/A;    FAMILY HISTORY: Family History  Problem Relation Age of Onset  . Hyperlipidemia Mother   . Glaucoma Mother   . Hypertension Mother   . Heart disease Father   . Cancer Father     prostate  . Diabetes Father   . Dementia Father   . Cancer Brother     esophageal  . Arthritis Sister     RA    SOCIAL HISTORY:  History   Social History  . Marital Status: Married    Spouse Name: N/A  . Number of Children: N/A  . Years of Education: N/A   Occupational History  . Not on file.   Social History Main Topics  . Smoking status: Former Research scientist (life sciences)  . Smokeless tobacco: Never Used  . Alcohol Use: No  . Drug Use: No  . Sexual Activity: Not on file   Other Topics Concern  . Not on file   Social History Narrative     PHYSICAL EXAM  There were no vitals filed for this visit.  There is no weight on file to calculate BMI.   General: The patient is well-developed and well-nourished and in  no acute distress  Eyes:  Funduscopic exam shows right optic disc pallor. Retinal vessels are normal..  Neck: The neck is supple, no carotid bruits are noted.  The neck is nontender.  Cardiovascular: The heart has a regular rate and  rhythm with a normal S1 and S2. There were no murmurs, gallops or rubs. Lungs are clear to auscultation.  Skin: Extremities are without significant edema.  Musculoskeletal:  Back is non-tender but tender over right piriformis  Neurologic Exam  Mental status: The patient is alert and oriented x 3 at the time of the examination. The patient has apparent normal recent and remote memory, with an apparently normal attention span and concentration ability.   Speech is normal.  Cranial nerves: Extraocular movements are full. Pupils show a 1+ right APD. She has decreased color vision OS.   Visual fields are full.  Facial symmetry is present. There is good facial sensation to soft touch bilaterally.Facial strength is normal.  Trapezius and sternocleidomastoid strength is normal. No dysarthria is noted.  The tongue is midline, and the patient has symmetric elevation of the soft palate. No obvious hearing deficits are noted.  Motor:  Muscle bulk is normal.   Tone is normal. Strength is  5 / 5 in all 4 extremities.   Sensory: Sensory testing is intact to pinprick, soft touch and vibration sensation in all 4 extremities.  Coordination: Cerebellar testing reveals good finger-nose-finger and heel-to-shin bilaterally.  Gait and station: Station is normal.   Gait is mildly wide and tandem is wide.   Romberg is negative.   Reflexes: Deep tendon reflexes are symmetric and normal bilaterally.   Plantar responses are flexor.    DIAGNOSTIC DATA (LABS, IMAGING, TESTING) - I reviewed patient records, labs, notes, testing and imaging myself where available.    ASSESSMENT AND PLAN  Multiple sclerosis - Plan: MR Brain W Wo Contrast  Midline low back pain without sciatica  Lumbar radicular pain  Lumbosacral facet joint syndrome  Ataxic gait - Plan: MR Brain W Wo Contrast  Restless leg syndrome  Migraine without aura and without status migrainosus, not intractable  Clinical depression  Anxiety  state    Summary, Nastacia Raybuck is a 64 year old woman with multiple sclerosis who continues to be fairly stable clinically on Betaseron. We will check an MRI of the brain with and without contrast to determine if she is having any subclinical progression. If this is occurring, we will need to reconsider changing to a more efficacious medication. Her back pain has done much better since having a radiofrequency ablation of medial branch nerves and seeing physical therapy. She continues to have some tenderness in the right piriformis region. Migraine headaches are doing fairly well.   I will have her try Maxalt to see if she tolerates better than Imitrex.  She will return to see me in 5 - 6 months or sooner if she has new or worsening neurologic symptoms.  55 minute face-to-face evaluation with greater than 50% of the time counseling or coronary care about her MS and neurologic symptoms.   Jocelyn Wilson A. Felecia Shelling, MD, PhD 3/54/5625, 6:38 PM Certified in Neurology, Clinical Neurophysiology, Sleep Medicine, Pain Medicine and Neuroimaging  [facility] [address] [place], [place] [address] [phone]

## 2014-11-28 NOTE — Telephone Encounter (Signed)
Per Faith, we are unable to refill meds until patient is seen at [facility]. Patient will need to get refills through [facility]--fax # [phone]

## 2014-11-29 ENCOUNTER — Telehealth: Payer: Self-pay | Admitting: *Deleted

## 2014-11-29 DIAGNOSIS — G35 Multiple sclerosis: Secondary | ICD-10-CM

## 2014-11-29 NOTE — Telephone Encounter (Signed)
mri w/o contrast ordered/fim

## 2014-11-29 NOTE — Telephone Encounter (Signed)
mri without contrast ordered/fim

## 2014-12-04 ENCOUNTER — Ambulatory Visit (INDEPENDENT_AMBULATORY_CARE_PROVIDER_SITE_OTHER): Payer: BLUE CROSS/BLUE SHIELD

## 2014-12-04 DIAGNOSIS — G35 Multiple sclerosis: Secondary | ICD-10-CM | POA: Diagnosis not present

## 2014-12-05 ENCOUNTER — Telehealth: Payer: Self-pay | Admitting: *Deleted

## 2014-12-05 NOTE — Telephone Encounter (Signed)
Spoke with [name], and per RAS, advised mri did not show any brand new lesions.  Advised RAS would like to get old images from [facility] for comparison.  She is agreeable and will stop by the office at her convenience over the next few days to sign a med rec release/fim

## 2014-12-05 NOTE — Telephone Encounter (Signed)
LMTC./fim 

## 2014-12-05 NOTE — Telephone Encounter (Signed)
Patient returning Faith, RN call.

## 2014-12-05 NOTE — Telephone Encounter (Signed)
-----   Message from [place]. Sater, MD sent at 12/05/2014 11:47 AM EST ----- MRI shows no brand new lesions --   I will ned to get [facility] films (I think last one was 2010 - 2012 brain MR) for comparison

## 2014-12-16 ENCOUNTER — Telehealth: Payer: Self-pay | Admitting: *Deleted

## 2014-12-16 NOTE — Telephone Encounter (Signed)
Release faxed to [facility] on 12/16/14.

## 2015-01-07 ENCOUNTER — Encounter: Payer: Self-pay | Admitting: Interventional Cardiology

## 2015-01-07 ENCOUNTER — Ambulatory Visit (INDEPENDENT_AMBULATORY_CARE_PROVIDER_SITE_OTHER): Payer: Self-pay | Admitting: Interventional Cardiology

## 2015-01-07 VITALS — BP 132/86 | HR 87 | Ht <= 58 in | Wt 213.0 lb

## 2015-01-07 DIAGNOSIS — M545 Low back pain: Secondary | ICD-10-CM

## 2015-01-07 DIAGNOSIS — E785 Hyperlipidemia, unspecified: Secondary | ICD-10-CM

## 2015-01-07 DIAGNOSIS — I471 Supraventricular tachycardia, unspecified: Secondary | ICD-10-CM

## 2015-01-07 MED ORDER — METOPROLOL SUCCINATE ER 50 MG PO TB24: 50.0000 mg | ORAL_TABLET | Freq: Every day | ORAL | Status: DC

## 2015-01-07 NOTE — Progress Notes (Signed)
Patient ID: Jocelyn Wilson, female   DOB: [birth year], 64 y.o.   MRN: [id number]     Cardiology Office Note   Date:  01/07/2015   ID:  Jocelyn Wilson, DOB May 17, 1951, MRN [id number]  PCP:  Gavin Pound, MD  Cardiologist:  Jettie Booze., MD   No chief complaint on file.     History of Present Illness: Jocelyn Wilson is a 64 y.o. female who presents for  Follow-up of palpitations. She has had issues with her feet to prevent much walking. She has continued to gain weight. Prior monitoring in 2008 showed SVT with a heart rate  Of 148 bpm. It was not atrial flutter.   She has been well managed on metoprolol. She does need a refill of this medication.   Of note, she has been stressed. Her husband will require shoulder surgery later today.   Prior echocardiogram from 2008 showed normal left ventricular function and normal valvular function.    Past Medical History  Diagnosis Date  . GERD (gastroesophageal reflux disease)   . Diverticula of colon   . Multiple sclerosis   . Obesity   . Allergy   . Hyperlipidemia   . Anxiety   . Chronic kidney disease     KIDNEY STONES WITH STENTS  . Eczema   . Thyroid disease     hyperthyroidism  . Restless leg syndrome   . Hypercholesterolemia   . Depression   . Migraine headache   . Kidney stones   . PONV (postoperative nausea and vomiting)   . Palpitations   . Hypothyroidism   . Snoring   . Vertigo   . Unsteady gait   . Vision abnormalities     Past Surgical History  Procedure Laterality Date  . Cholecystectomy    . Lithotripsy    . Nissen fundoplication    . Finger surgery  x8  . Breast surgery      AUGMENTATION  . Hernia repair    . Carpel tunnel    . Dilation and curettage of uterus  1983  . Ventral hernia repair  07/25/2012    Procedure: LAPAROSCOPIC VENTRAL HERNIA;  Surgeon: Adin Hector, MD;  Location: [facility];  Service: General;  Laterality: N/A;     Current Outpatient Prescriptions  Medication Sig  Dispense Refill  . ALPRAZolam (XANAX) 0.5 MG tablet Take 0.5 mg by mouth.    Marland Kitchen atorvastatin (LIPITOR) 20 MG tablet Take 20 mg by mouth daily.    . busPIRone (BUSPAR) 30 MG tablet Take 30 mg by mouth 3 (three) times daily.    Marland Kitchen EPINEPHrine (EPIPEN) 0.3 mg/0.3 mL DEVI Inject 0.3 mg into the muscle as needed.     . Ergocalciferol (VITAMIN D2) 2000 UNITS TABS Take 2,000 Units by mouth daily.     Marland Kitchen esomeprazole (NEXIUM) 40 MG capsule Take 40 mg by mouth 2 (two) times daily.     . ferrous sulfate 325 (65 FE) MG tablet Take 325 mg by mouth 2 (two) times daily.    Marland Kitchen FLOVENT HFA 220 MCG/ACT inhaler Inhale 1 puff into the lungs daily.   1  . Ibuprofen (ADVIL) 200 MG CAPS Take 2 capsules by mouth 3 (three) times daily as needed. For migraines    . interferon beta-1b (BETASERON) 0.3 MG injection Inject 0.25 mg into the skin every other day.    . levothyroxine (SYNTHROID, LEVOTHROID) 150 MCG tablet Take 150 mcg by mouth daily before breakfast.    . metoprolol succinate (TOPROL-XL) 50  MG 24 hr tablet Take 1 tablet (50 mg total) by mouth daily. Take with or immediately following a meal. 90 tablet 3  . potassium citrate (UROCIT-K) 10 MEQ (1080 MG) SR tablet Take 10 mEq by mouth 3 (three) times daily with meals.    . pyridOXINE (VITAMIN B-6) 100 MG tablet Take 100 mg by mouth daily.    . Pyridoxine HCl (VITAMIN B-6 PO) Take 1 tablet by mouth daily.    . ranitidine (ZANTAC) 75 MG tablet Take 75 mg by mouth at bedtime.    . rizatriptan (MAXALT-MLT) 5 MG disintegrating tablet Take 1 tablet (5 mg total) by mouth as needed for migraine. May repeat in 2 hours if needed 10 tablet 5  . rOPINIRole (REQUIP) 0.25 MG tablet Take 1 mg by mouth QID.     Marland Kitchen sertraline (ZOLOFT) 100 MG tablet Take 150 mg by mouth daily.    . traZODone (DESYREL) 100 MG tablet Take 150 mg by mouth at bedtime.     No current facility-administered medications for this visit.    Allergies:   Iron; Ciprofloxacin; Contrast media; Sulfa antibiotics;  and Tape    Social History:  The patient  reports that she has quit smoking. She has never used smokeless tobacco. She reports that she does not drink alcohol or use illicit drugs.   Family History:  The patient's family history includes Arthritis in her sister; Cancer in her brother and father; Dementia in her father; Diabetes in her father; Glaucoma in her mother; Heart disease in her father; Hyperlipidemia in her mother; Hypertension in her mother.    ROS:  Please see the history of present illness.   Otherwise, review of systems are positive for foot pain, mild DOE with going up stairs.  THis is chronic.   All other systems are reviewed and negative.    PHYSICAL EXAM: VS:  BP 132/86 mmHg  Pulse 87  Ht 4\' 10"  (1.473 m)  Wt 213 lb (96.616 kg)  BMI 44.53 kg/m2 , BMI Body mass index is 44.53 kg/(m^2). GEN: Well nourished, well developed, in no acute distress HEENT: normal Neck: no JVD, carotid bruits, or masses Cardiac: RRR; no murmurs, rubs, or gallops,no edema  Respiratory:  clear to auscultation bilaterally, normal work of breathing GI: soft, nontender, nondistended, + BS, obese MS: no deformity or atrophy Skin: warm and dry, no rash Neuro:  Strength and sensation are intact Psych: euthymic mood, full affect   EKG:  EKG is ordered today. The ekg ordered today demonstrates NSR, NSST;  I personally reviewed her prior ECG from 2014 and there was no significant change   Recent Labs: No results found for requested labs within last 365 days.    Lipid Panel No results found for: CHOL, TRIG, HDL, CHOLHDL, VLDL, LDLCALC, LDLDIRECT    Wt Readings from Last 3 Encounters:  01/07/15 213 lb (96.616 kg)  02/27/13 200 lb (90.719 kg)  01/11/13 202 lb (91.627 kg)      Other studies Reviewed: Additional studies/ records that were reviewed today include:  Echo and monitor as noted above    ASSESSMENT AND PLAN:  1.  SVT:  Refill metoprolol. Symptoms well controlled. No need for  any invasive testing at this time.  2.   Obesity: She has continued to gain weight. It may be reasonable to consider weight loss surgery. I will defer to her primary care physician.  3.   Hyperlipidemia: this is managed by her primary care doctor.   Current medicines  are reviewed at length with the patient today.  The patient does not have concerns regarding medicines.  The following changes have been made:   Refill metoprolol  Labs/ tests ordered today include:  none No orders of the defined types were placed in this encounter.     Disposition:   FU with me in  1 year   Teresita Madura., MD  01/07/2015 10:08 AM    [facility] [place], [place],   [address] Phone: [phone]; Fax: [phone]

## 2015-01-07 NOTE — Patient Instructions (Signed)
Your physician wants you to follow-up in: 1 year. You will receive a reminder letter in the mail two months in advance. If you don't receive a letter, please call our office to schedule the follow-up appointment.  

## 2015-01-16 ENCOUNTER — Telehealth: Payer: Self-pay | Admitting: Neurology

## 2015-01-16 NOTE — Telephone Encounter (Signed)
Patient is calling because for the past two weeks she gets a tingling in the right side of cheek and tongue. This is very annoying for patient. Is this an exacerbation of patient's MS and should she be concerned. Please call and advise. Thank you.

## 2015-01-16 NOTE — Telephone Encounter (Signed)
Spoke with Jocelyn Wilson and per RAS, advised tingling in cheek/tongue could be r/t MS, also may be other thinks such as trigeminal neuralgia, and she should let us know if it worsens.   She sts. sx. has been off/on.  Not getting worse.  She will call me back if sx. worsens, or if she feels she would like to be seen/fim

## 2015-01-24 ENCOUNTER — Telehealth: Payer: Self-pay | Admitting: Neurology

## 2015-01-24 MED ORDER — SERTRALINE HCL 100 MG PO TABS: 150.0000 mg | ORAL_TABLET | Freq: Every day | ORAL | Status: DC

## 2015-01-24 NOTE — Telephone Encounter (Signed)
[facility] is calling requesting a refill on sertraline (ZOLOFT) 100 MG tablet when calling please call [phone] and use invoice # [id number].

## 2015-01-24 NOTE — Telephone Encounter (Signed)
Sertraline r/f to [facility]/fim

## 2015-02-03 ENCOUNTER — Telehealth: Payer: Self-pay | Admitting: Neurology

## 2015-02-03 ENCOUNTER — Other Ambulatory Visit: Payer: Self-pay | Admitting: *Deleted

## 2015-02-03 MED ORDER — BUSPIRONE HCL 30 MG PO TABS: 30.0000 mg | ORAL_TABLET | Freq: Three times a day (TID) | ORAL | Status: DC

## 2015-02-03 NOTE — Telephone Encounter (Signed)
Patient requesting refill for Rx busPIRone (BUSPAR) 30 MG tablet forwarded to [facility] due to pharmacy faxed renewal request to [facility] and she's completely out.  Also requesting renewal request Rx busPIRone (BUSPAR) 30 MG tablet faxed to [facility].  Please call and advise.

## 2015-02-03 NOTE — Telephone Encounter (Signed)
Spoke with [name] and advised 30 day rx. has been escribed to [facility], and 90 day rx. has been escribed to [facility]/fim

## 2015-03-25 ENCOUNTER — Ambulatory Visit: Payer: Self-pay

## 2015-04-01 ENCOUNTER — Ambulatory Visit: Payer: Self-pay

## 2015-04-08 ENCOUNTER — Ambulatory Visit: Payer: Self-pay

## 2015-04-15 ENCOUNTER — Encounter: Payer: Self-pay | Admitting: Neurology

## 2015-04-22 ENCOUNTER — Other Ambulatory Visit: Payer: Self-pay | Admitting: Neurology

## 2015-05-07 ENCOUNTER — Telehealth: Payer: Self-pay | Admitting: Neurology

## 2015-05-07 NOTE — Telephone Encounter (Signed)
noted/fim 

## 2015-05-07 NOTE — Telephone Encounter (Addendum)
Error

## 2015-05-07 NOTE — Telephone Encounter (Signed)
Patient called stating she was injecting betaseron this morning and needle(plastic) broke off. She feels she got it all out. She did call [facility] and she was told to watch the area for 1 week for redness and swelling and to call Dr that prescribed medication to make aware of situation. She does have appointment on August 17. She states there is no need to return call but if she sees any changes she will the office. Patient can be reached at [phone].

## 2015-05-08 ENCOUNTER — Telehealth: Payer: Self-pay | Admitting: Neurology

## 2015-05-08 NOTE — Telephone Encounter (Signed)
[facility] called and requested to speak with Faith RN regarding a medication for the patient. Please call and advise.

## 2015-05-08 NOTE — Telephone Encounter (Signed)
I have spoken with Al at [facility] this morning and clarified Betaseron rx/fim

## 2015-05-28 ENCOUNTER — Encounter: Payer: Self-pay | Admitting: Neurology

## 2015-05-28 ENCOUNTER — Ambulatory Visit (INDEPENDENT_AMBULATORY_CARE_PROVIDER_SITE_OTHER): Payer: BLUE CROSS/BLUE SHIELD | Admitting: Neurology

## 2015-05-28 ENCOUNTER — Telehealth: Payer: Self-pay | Admitting: *Deleted

## 2015-05-28 VITALS — BP 140/76 | HR 86 | Resp 16 | Ht <= 58 in | Wt 212.2 lb

## 2015-05-28 DIAGNOSIS — M545 Low back pain, unspecified: Secondary | ICD-10-CM

## 2015-05-28 DIAGNOSIS — G35 Multiple sclerosis: Secondary | ICD-10-CM

## 2015-05-28 DIAGNOSIS — F411 Generalized anxiety disorder: Secondary | ICD-10-CM | POA: Diagnosis not present

## 2015-05-28 DIAGNOSIS — E119 Type 2 diabetes mellitus without complications: Secondary | ICD-10-CM | POA: Insufficient documentation

## 2015-05-28 DIAGNOSIS — G43009 Migraine without aura, not intractable, without status migrainosus: Secondary | ICD-10-CM | POA: Diagnosis not present

## 2015-05-28 DIAGNOSIS — E11329 Type 2 diabetes mellitus with mild nonproliferative diabetic retinopathy without macular edema: Secondary | ICD-10-CM | POA: Diagnosis not present

## 2015-05-28 NOTE — Progress Notes (Signed)
[facility]  PATIENT: Jocelyn Wilson DOB: 26-Jun-1951  REFERRING DOCTOR OR PCP:  Adaku Nnodi SOURCE: Patient  _________________________________   HISTORICAL  CHIEF COMPLAINT:  Chief Complaint  Patient presents with  . Multiple Sclerosis    Sts. she tolerates Betaseron well.  Sts. she thinks legs are weaker--she bought a cane but is not using it.  Sts. dizziness has been worse--worse with changing positions, also worse b/c her glasses broke yesterday and she is wearing old glasses.Hilton Cork    HISTORY OF PRESENT ILLNESS:  Jocelyn Wilson is a 64 year old woman with multiple sclerosis, back pain with sciatica, and migraines.  The last MRI of the brain was performed on 08/17/2009. He was abnormal showing periventricular white matter foci consistent with multiple sclerosis. When compared to prior MRI dated 08/26/2007, there were no new lesions. The older scan also showed a small left medullary focus that was not observed on the more recent study.  Gait/strength/sensation:  She has some difficulty with gait and feels her balance  and has fallen a few times.   She cannot balance well enough to carry items when she walks.    No significant problem with strength or numbness.     She feels some dizziness when staniing and feels she needs to get her balance before she is comfortable to walk.  Bladder:   She has some urinary frequency. She denies any hesitancy.   She has not had urinary tract infections.   Vision:   NO MS related vision difficulty.  Fatigue/sleep:   She has fatigue despite sleeping better at night with trazodone. Provigil has helped her fatigue some.    She has had some sleep issues including restless leg syndrome. Ropinirole has helped the uncomfortable sensation and she tolerates it well. She snores and is always tired. She has not had witnessed sleep apnea. She would not use CPAP or an oral appliance so does not want to get tested.   She has some fatigue but  feels it is not as bad as last year. She was able to stop the modafinil. She did not think it helped her that much.  Mood/cognition:   She has a chronic mild depression and often feels sad with occasional crying. She takes sertraline, BuSpar and alprazolam.   She notes some cognitive issues.   She has more difficulty navigating and has gotten lost.   Mild short term memory issues and difficulty multi-tasking.     MS History:   Her MS was diagnosed around 2004.    She was actually seeing me for migraine headache when she presented with optic neuritis and vertigo and had another MRI.   She had interval development of multiple new foci and underwent a lumbar puncture.  CSF was also consistent with MS.   She was placed on Betaseron and continues on that medication with good control of the MS. Had definite exacerbations. She has stable intermittent tingling and mild decreased balance. She notes that she veers as she walks it sometimes needs to grab a wall for balance. She will get vertigo. She denies any numbness in her legs.  LBP/leg pain:  She has had a lot of issues with back and leg pain but this is less of a problem today.  MRI of the lumbar spine dated 01/23/2011 shows severe degenerative disc disease and facet hypertrophy at L4-L5. She has had several pain management injection procedures.  She has had radiofrequency ablations of the medial branch nerves with benefit. She  also has had ESI's, SI joint injections, piriformis muscles injections with less benefit.   She had RFA of the medial branch nerves 08/2014 with some benefit and got much more benefit after also doing PT 11/15 - 1/16.   Neck Pain:   She has had neck pain and some numbness in the hands but is better today.   MRI of the cervical spine dated 01/29/2012 shows a left lateral disc bulge C5-C6 with possible dynamic impingement of the exiting left C6 nerve root.  There appears to be thickening of the posterior longitudinal ligament throughout the  upper cervical spine.  Migraine:  She also reports migraine headaches which are frequently.   She feels they are triggered by changes in weather.      She has a near daily mild HA daily that intensifies at times.    When HA is more severe (often when she wakes up with one) she takes NSAID's.    When severe, she has throbbing pain with photophobia and mild nausea. Imitrex caused a lot of Nausea and she stopped.    She has Maxalt but has not taken it yet.    REVIEW OF SYSTEMS: Constitutional: No fevers, chills, sweats, or change in appetite Eyes: No visual changes, double vision, eye pain Ear, nose and throat: No hearing loss, ear pain, nasal congestion, sore throat Cardiovascular: No chest pain, palpitations Respiratory: No shortness of breath at rest or with exertion.   No wheezes GastrointestinaI: No nausea, vomiting, diarrhea, abdominal pain, fecal incontinence Genitourinary: No dysuria, urinary retention or frequency.  No nocturia. Musculoskeletal: No neck pain, back pain Integumentary: No rash, pruritus, skin lesions Neurological: as above Psychiatric: No depression at this time.  No anxiety Endocrine: No palpitations, diaphoresis, change in appetite, change in weigh or increased thirst Hematologic/Lymphatic: No anemia, purpura, petechiae. Allergic/Immunologic: No itchy/runny eyes, nasal congestion, recent allergic reactions, rashes  ALLERGIES: Allergies  Allergen Reactions  . Iron Anaphylaxis    Iv iron  . Ciprofloxacin     Stomach upset  . Contrast Media [Iodinated Diagnostic Agents] Itching    Rash and itching  . Sulfa Antibiotics     Rash and itching  . Tape Rash    Adhesive tape    HOME MEDICATIONS:  Current outpatient prescriptions:  .  atorvastatin (LIPITOR) 20 MG tablet, Take 20 mg by mouth daily., Disp: , Rfl:  .  busPIRone (BUSPAR) 30 MG tablet, Take 1 tablet (30 mg total) by mouth 3 (three) times daily., Disp: 270 tablet, Rfl: 1 .  EPINEPHrine (EPIPEN) 0.3  mg/0.3 mL DEVI, Inject 0.3 mg into the muscle as needed. , Disp: , Rfl:  .  Ergocalciferol (VITAMIN D2) 2000 UNITS TABS, Take 2,000 Units by mouth daily. , Disp: , Rfl:  .  esomeprazole (NEXIUM) 40 MG capsule, Take 40 mg by mouth 2 (two) times daily. , Disp: , Rfl:  .  ferrous sulfate 325 (65 FE) MG tablet, Take 325 mg by mouth 2 (two) times daily., Disp: , Rfl:  .  FLOVENT HFA 220 MCG/ACT inhaler, Inhale 1 puff into the lungs daily. , Disp: , Rfl: 1 .  Ibuprofen (ADVIL) 200 MG CAPS, Take 2 capsules by mouth 3 (three) times daily as needed. For migraines, Disp: , Rfl:  .  interferon beta-1b (BETASERON) 0.3 MG injection, Inject 0.25 mg into the skin every other day., Disp: , Rfl:  .  levothyroxine (SYNTHROID, LEVOTHROID) 150 MCG tablet, Take 150 mcg by mouth daily before breakfast., Disp: , Rfl:  .  metoprolol succinate (TOPROL-XL) 50 MG 24 hr tablet, Take 1 tablet (50 mg total) by mouth daily. Take with or immediately following a meal., Disp: 90 tablet, Rfl: 3 .  potassium citrate (UROCIT-K) 10 MEQ (1080 MG) SR tablet, Take 10 mEq by mouth 3 (three) times daily with meals., Disp: , Rfl:  .  pyridOXINE (VITAMIN B-6) 100 MG tablet, Take 100 mg by mouth daily., Disp: , Rfl:  .  ranitidine (ZANTAC) 75 MG tablet, Take 75 mg by mouth at bedtime., Disp: , Rfl:  .  rOPINIRole (REQUIP) 0.25 MG tablet, Take 1 mg by mouth QID. , Disp: , Rfl:  .  sertraline (ZOLOFT) 100 MG tablet, TAKE ONE AND ONE-HALF TABLETS DAILY, Disp: 90 tablet, Rfl: 1 .  traZODone (DESYREL) 100 MG tablet, Take 150 mg by mouth at bedtime., Disp: , Rfl:  .  ALPRAZolam (XANAX) 0.5 MG tablet, Take 0.5 mg by mouth., Disp: , Rfl:  .  Pyridoxine HCl (VITAMIN B-6 PO), Take 1 tablet by mouth daily., Disp: , Rfl:  .  rizatriptan (MAXALT-MLT) 5 MG disintegrating tablet, Take 1 tablet (5 mg total) by mouth as needed for migraine. May repeat in 2 hours if needed (Patient not taking: Reported on 05/28/2015), Disp: 10 tablet, Rfl: 5  PAST MEDICAL  HISTORY: Past Medical History  Diagnosis Date  . GERD (gastroesophageal reflux disease)   . Diverticula of colon   . Multiple sclerosis   . Obesity   . Allergy   . Hyperlipidemia   . Anxiety   . Chronic kidney disease     KIDNEY STONES WITH STENTS  . Eczema   . Thyroid disease     hyperthyroidism  . Restless leg syndrome   . Hypercholesterolemia   . Depression   . Migraine headache   . Kidney stones   . PONV (postoperative nausea and vomiting)   . Palpitations   . Hypothyroidism   . Snoring   . Vertigo   . Unsteady gait   . Vision abnormalities     PAST SURGICAL HISTORY: Past Surgical History  Procedure Laterality Date  . Cholecystectomy    . Lithotripsy    . Nissen fundoplication    . Finger surgery  x8  . Breast surgery      AUGMENTATION  . Hernia repair    . Carpel tunnel    . Dilation and curettage of uterus  1983  . Ventral hernia repair  07/25/2012    Procedure: LAPAROSCOPIC VENTRAL HERNIA;  Surgeon: Adin Hector, MD;  Location: [facility];  Service: General;  Laterality: N/A;    FAMILY HISTORY: Family History  Problem Relation Age of Onset  . Hyperlipidemia Mother   . Glaucoma Mother   . Hypertension Mother   . Heart disease Father   . Cancer Father     prostate  . Diabetes Father   . Dementia Father   . Cancer Brother     esophageal  . Arthritis Sister     RA    SOCIAL HISTORY:  Social History   Social History  . Marital Status: Married    Spouse Name: N/A  . Number of Children: N/A  . Years of Education: N/A   Occupational History  . Not on file.   Social History Main Topics  . Smoking status: Former Research scientist (life sciences)  . Smokeless tobacco: Never Used  . Alcohol Use: No  . Drug Use: No  . Sexual Activity: Not on file   Other Topics Concern  . Not on  file   Social History Narrative     PHYSICAL EXAM  Filed Vitals:   05/28/15 1411  BP: 140/76  Pulse: 86  Resp: 16  Height: 4\' 10"  (1.473 m)  Weight: 212 lb 3.2 oz (96.253 kg)     Body mass index is 44.36 kg/(m^2).   General: The patient is well-developed and well-nourished and in no acute distress  Eyes:  Funduscopic exam shows right optic disc pallor. Retinal vessels are normal..  Neck: The neck is supple, no carotid bruits are noted.  The neck is nontender.  Cardiovascular: The heart has a regular rate and rhythm with a normal S1 and S2. There were no murmurs, gallops or rubs. Lungs are clear to auscultation.  Skin: Extremities are without significant edema.  Musculoskeletal:  Back is non-tender but tender over right piriformis  Neurologic Exam  Mental status: The patient is alert and oriented x 3 at the time of the examination. The patient has apparent normal recent and remote memory, with an apparently normal attention span and concentration ability.   Speech is normal.  Cranial nerves: Extraocular movements are full. Pupils show a 1+ right APD. She has decreased color vision OS.   Visual fields are full.  Facial symmetry is present. There is good facial sensation to soft touch bilaterally.Facial strength is normal.  Trapezius and sternocleidomastoid strength is normal. No dysarthria is noted.  The tongue is midline, and the patient has symmetric elevation of the soft palate. No obvious hearing deficits are noted.  Motor:  Muscle bulk is normal.   Tone is normal. Strength is  5 / 5 in all 4 extremities.   Sensory: Sensory testing is intact to soft touch.   She reports decreased vibration sensation at the knees but normal at the toes.  Coordination: Cerebellar testing reveals good finger-nose-finger and heel-to-shin bilaterally.  Gait and station: Station is normal.   Gait is mildly wide and tandem is wide.   Romberg is negative.   Reflexes: Deep tendon reflexes are symmetric and normal bilaterally.       DIAGNOSTIC DATA (LABS, IMAGING, TESTING) - I reviewed patient records, labs, notes, testing and imaging myself where available.    ASSESSMENT  AND PLAN  Multiple sclerosis  Migraine without aura and without status migrainosus, not intractable  Anxiety state  Midline low back pain without sciatica   1.   We will contact [facility] about Betaseron 2.   If tingling worsens, consider gabapentin or lamotrigine 3.   Referral to nutritionist for diabetic counseling.    4.    If LBP pain worsens, consider repeat  radiofrequency ablation of medial branch nerves 5.     She will return to see me in 5 - 6 months or sooner if she has new or worsening neurologic symptoms.  40 minute face-to-face evaluation with greater than 50% of the time counseling or coronary care about her MS and neurologic symptoms.   Chares Slaymaker A. Felecia Shelling, MD, PhD 10/29/1476, 2:95 PM Certified in Neurology, Clinical Neurophysiology, Sleep Medicine, Pain Medicine and Neuroimaging  [facility] [address], [place], [place] [address] [phone]

## 2015-05-28 NOTE — Telephone Encounter (Signed)
Insurance has been contacted and provided with clinical info.  Request is currently under review.  Ref Case [id number]

## 2015-05-29 ENCOUNTER — Ambulatory Visit: Payer: BLUE CROSS/BLUE SHIELD | Admitting: Neurology

## 2015-06-23 ENCOUNTER — Telehealth: Payer: Self-pay | Admitting: Neurology

## 2015-06-23 NOTE — Telephone Encounter (Signed)
Clinic notes have been forwarded to [facility]: [place] Ref # [id number]

## 2015-06-23 NOTE — Telephone Encounter (Signed)
Patient is calling in regard to Rx Betaseron 0.3 mg injection.  Her insurance has denied the drug again.  She wants to make it clear she needs to stay on this medication as she doesn't have a reaction to the drug. She would like another appeal.  Thanks!

## 2015-06-24 NOTE — Telephone Encounter (Signed)
Jocelyn Wilson with [facility] received email from supervisor today to reach out to advise that is Jocelyn Wilson can replace Jocelyn Wilson if Jocelyn Wilson approves. He states the patient seems to be willing to receive the product. Please call and advise. He can be reached at [phone] any pharmasist can help.

## 2015-06-25 NOTE — Telephone Encounter (Signed)
I have spoken with [facility] this morning--they state Betaseron requires PA.  She will fax me the pa paperwork and I will forward it to Jessica/fim

## 2015-06-27 NOTE — Telephone Encounter (Signed)
I have spoken with Arli today.  She sts. she received a call from [place] this morning stating she needed to contact our office regarding starting a new MS therapy.  Brilyn is very upset, stating she does not want to be on any med other than Betaseron, as she has been on this since her 2004 MS dx., has had almost no sx. progression, has not relapsed on it.  I have spoken with Angie in the  [place] Dept. at [place].  She sts. appeal for Betaseron was denied, that I can file a 2nd appeal.  I have offered peer to peer with Dr. Felecia Shelling, but she sts. [facility] does not  participate in peer to peers and that 2nd appeals must be submitted by fax.  I have asked that she fax me the appeals denial and info on starting the 2nd appeal today, to [phone].  She sts. she will do this/fim

## 2015-06-27 NOTE — Telephone Encounter (Addendum)
Pt called and expressed that she does not want to try the Extavia , AT ALL! She only wants to take Betaseron. She is very upset about this process and states she goes through this every year. States the insurance company instructed her to talk with her husbands company who she has insurance with . They are ok with her keeping with Betaseron. [facility] needs a letter from our office stating the pt needs to stay on this medication. Please call and advise [phone]

## 2015-06-27 NOTE — Telephone Encounter (Signed)
I have spoken with Jocelyn Wilson again today and advised that I will start 2nd appeal to get Jocelyn Wilson approved.  I have confirmed that she has enough Jocelyn Wilson for now.  Should she start to run short, I will contact Jocelyn Wilson pt. support and see if they are able to provide her meds while appeal is being filed.  Jocelyn Wilson verbalized understanding of same.   Note: [facility] sts. Jocelyn Wilson is being denied since Jocelyn Wilson has not tried and failed 2 other meds such as Jocelyn Wilson, Jocelyn Wilson, or Jocelyn Wilson.  Jocelyn Wilson has been on Jocelyn Wilson since her dx. in 2004 and tolerates it very well, sx. are well controlled, no relapses on Jocelyn Wilson./fim

## 2015-07-02 NOTE — Telephone Encounter (Signed)
I have not received info on filing 2nd appeals for Jocelyn Wilson's Betaseron, so I called [facility] again today--I have spoken with both Colletta [place] in the [place] Dept, and Letha Cape in the [employer] and requested info. again./fim

## 2015-07-03 ENCOUNTER — Encounter: Payer: BLUE CROSS/BLUE SHIELD | Attending: Family Medicine | Admitting: Dietician

## 2015-07-03 ENCOUNTER — Encounter: Payer: Self-pay | Admitting: Dietician

## 2015-07-03 VITALS — Ht <= 58 in | Wt 213.2 lb

## 2015-07-03 DIAGNOSIS — Z713 Dietary counseling and surveillance: Secondary | ICD-10-CM | POA: Diagnosis not present

## 2015-07-03 DIAGNOSIS — E11329 Type 2 diabetes mellitus with mild nonproliferative diabetic retinopathy without macular edema: Secondary | ICD-10-CM | POA: Diagnosis present

## 2015-07-03 NOTE — Progress Notes (Signed)
Diabetes Self-Management Education  Visit Type: First/Initial  Appt. Start Time: 950 Appt. End Time: 1105  07/03/2015  Jocelyn Wilson, identified by name and date of birth, is a 64 y.o. female with a diagnosis of Diabetes: Type 2.   ASSESSMENT  Height 4\' 10"  (1.473 m), weight 213 lb 3.2 oz (96.707 kg). Body mass index is 44.57 kg/(m^2).      Diabetes Self-Management Education - 07/03/15 1108    Patient Education   Disease state  Definition of diabetes, type 1 and 2, and the diagnosis of diabetes   Nutrition management  Role of diet in the treatment of diabetes and the relationship between the three main macronutrients and blood glucose level;Food label reading, portion sizes and measuring food.;Carbohydrate counting   Physical activity and exercise  Role of exercise on diabetes management, blood pressure control and cardiac health.;Helped patient identify appropriate exercises in relation to his/her diabetes, diabetes complications and other health issue.   Acute complications Taught treatment of hypoglycemia - the 15 rule.;Discussed and identified patients' treatment of hyperglycemia.   Chronic complications Dental care;Assessed and discussed foot care and prevention of foot problems;Reviewed with patient heart disease, higher risk of, and prevention   Individualized Goals (developed by patient)   Nutrition Follow meal plan discussed   Physical Activity Exercise 1-2 times per week   Medications take my medication as prescribed   Reducing Risk get labs drawn;do foot checks daily   Outcomes   Expected Outcomes Demonstrated interest in learning. Expect positive outcomes   Future DMSE PRN   Program Status Completed      Individualized Plan for Diabetes Self-Management Training:   Learning Objective:  Patient will have a greater understanding of diabetes self-management. Patient education plan is to attend individual and/or group sessions per assessed needs and concerns.    Plan:   Patient Instructions  Goals:  Follow Diabetes Meal Plan as instructed  Eat 3 meals and 2 snacks, every 3-5 hrs  Limit carbohydrate intake to 30-45 grams carbohydrate/meal  Limit carbohydrate intake to 0-15 grams carbohydrate/snack  Add lean protein foods to meals/snacks  Monitor glucose levels if instructed by your doctor  Aim for a goal 30 mins of physical activity daily (start with small amount and work your way up)    Expected Outcomes:  Demonstrated interest in learning. Expect positive outcomes  Education material provided: Living Well with Diabetes, Meal plan card and Snack sheet  If problems or questions, patient to contact team via:  Phone  Future DSME appointment: PRN

## 2015-07-03 NOTE — Patient Instructions (Signed)
Goals:  Follow Diabetes Meal Plan as instructed  Eat 3 meals and 2 snacks, every 3-5 hrs  Limit carbohydrate intake to 30-45 grams carbohydrate/meal  Limit carbohydrate intake to 0-15 grams carbohydrate/snack  Add lean protein foods to meals/snacks  Monitor glucose levels if instructed by your doctor  Aim for a goal 30 mins of physical activity daily (start with small amount and work your way up)

## 2015-07-29 ENCOUNTER — Other Ambulatory Visit: Payer: Self-pay | Admitting: Neurology

## 2015-08-01 ENCOUNTER — Other Ambulatory Visit: Payer: Self-pay | Admitting: *Deleted

## 2015-08-01 MED ORDER — SERTRALINE HCL 100 MG PO TABS: ORAL_TABLET | ORAL | Status: DC

## 2015-08-01 NOTE — Telephone Encounter (Signed)
I have spoken with Jocelyn Wilson this morning.  She sts. her ins. is adding Betaseron to their preferred drug list for 2017, and has agreed to cover her Betaseron for the rest of 2016 as well.  Update given to RAS/fim

## 2015-08-01 NOTE — Telephone Encounter (Signed)
I have spoken with Jocelyn Wilson this morning--she takes Sertraline 100mg , 1 and 1/2 tabs daily.  Last rx. sent in (3 mo. supply) was for #90.  New, corrected rx. with quantity of #135 escribed to [facility] per her request/fim

## 2015-08-10 ENCOUNTER — Other Ambulatory Visit: Payer: Self-pay | Admitting: Neurology

## 2015-10-29 ENCOUNTER — Encounter: Payer: Self-pay | Admitting: Neurology

## 2015-10-29 ENCOUNTER — Ambulatory Visit (INDEPENDENT_AMBULATORY_CARE_PROVIDER_SITE_OTHER): Payer: BLUE CROSS/BLUE SHIELD | Admitting: Neurology

## 2015-10-29 VITALS — BP 122/70 | HR 76 | Resp 16 | Ht <= 58 in | Wt 213.0 lb

## 2015-10-29 DIAGNOSIS — R42 Dizziness and giddiness: Secondary | ICD-10-CM | POA: Diagnosis not present

## 2015-10-29 DIAGNOSIS — G35 Multiple sclerosis: Secondary | ICD-10-CM

## 2015-10-29 DIAGNOSIS — M5431 Sciatica, right side: Secondary | ICD-10-CM

## 2015-10-29 DIAGNOSIS — M545 Low back pain: Secondary | ICD-10-CM

## 2015-10-29 DIAGNOSIS — M5416 Radiculopathy, lumbar region: Secondary | ICD-10-CM

## 2015-10-29 DIAGNOSIS — G43009 Migraine without aura, not intractable, without status migrainosus: Secondary | ICD-10-CM

## 2015-10-29 DIAGNOSIS — M47817 Spondylosis without myelopathy or radiculopathy, lumbosacral region: Secondary | ICD-10-CM

## 2015-10-29 HISTORY — DX: Sciatica, right side: M54.31

## 2015-10-29 MED ORDER — MECLIZINE HCL 12.5 MG PO TABS: 12.5000 mg | ORAL_TABLET | Freq: Three times a day (TID) | ORAL | Status: DC | PRN

## 2015-10-29 NOTE — Progress Notes (Signed)
[facility]  PATIENT: Jocelyn Wilson DOB: November 19, 1950  REFERRING DOCTOR OR PCP:  Adaku Nnodi SOURCE: Patient  _________________________________   HISTORICAL  CHIEF COMPLAINT:  Chief Complaint  Patient presents with  . Multiple Sclerosis    Sts. she continues to tolerate Betaseron well.  She is having more trouble remembering to take it since she retired and her schedule changed.  She continues to c/o frequent episodes of dizziness.  Feels walking is worse.  Sts. she is having more lbp radiating into bilat buttocks, worse on the right./fim    HISTORY OF PRESENT ILLNESS:  Jocelyn Wilson is a 65 year old woman with multiple sclerosis, back pain with sciatica, and migraines.  Her LBP is worse the last few months.  Pain is most intense in the right buttock, sometimes with radiating pain in the leg   Moderate pain will go across the lower back at times.    MRI of the lumbar spine dated 01/23/2011 shows severe degenerative disc disease and facet hypertrophy at L4-L5. She has had several pain management injection procedures.  She has had radiofrequency ablations of the medial branch nerves with benefit. She also has had ESI's, SI joint injections, piriformis muscles injections with less benefit.   She had RFA of the medial branch nerves 08/2014 with some benefit and got much more benefit after also doing PT 11/15 - 1/16.   Vertigo:   She notes vertigo that is worse with motions (long car drive) or when looking up.  However, there eappears to be a low level of vertigo throughout the day.    She was unable to tolerate valium in the past.    MS:   The last MRI of the brain was performed on 08/17/2009.  It was abnormal showing periventricular white matter foci consistent with multiple sclerosis. When compared to prior MRI dated 08/26/2007, there were no new lesions. The older scan also showed a small left medullary focus that was not observed on the more recent study.      She is  on Betaseron.    She has not had any definite exacerbation.   She tolerates it well.      Gait/strength/sensation:  She has more difficulty with her balance and gait is worse.      She cannot balance well enough to carry items when she walks.    No significant problem with strength or numbness.     She feels some dizziness when staniing and feels she needs to get her balance before she is comfortable to walk.  Bladder:   She has some urinary frequency. She denies any hesitancy.   She has not had urinary tract infections.   Vision:   She wears glasses.    She had right ON in the past.   She has had dry eyes.  Her right eye is sometimes blurry, more likely in the evening  Fatigue/sleep:   She has fatigue despite sleeping better at night with trazodone. Provigil has helped her fatigue some.     Mood/cognition:   She has a chronic mild depression and often feels sad with occasional crying. She takes sertraline, BuSpar and alprazolam.   She notes some cognitive issues.   She has more difficulty navigating and has gotten lost.   Mild short term memory issues and difficulty multi-tasking.     Migraine:  She has migraine headaches, often triggered by changes in weather.        When HA is more severe (often  when she wakes up with one) she takes NSAID's.    When severe, she has throbbing pain with photophobia and mild nausea. Imitrex caused a lot of Nausea and she stopped.     MS History:   Her MS was diagnosed around 2004.    She was actually seeing me for migraine headache when she presented with optic neuritis and vertigo and had another MRI.   She had interval development of multiple new foci and underwent a lumbar puncture.  CSF was also consistent with MS.   She was placed on Betaseron and continues on that medication with good control of the MS. No definite exacerbations.    REVIEW OF SYSTEMS: Constitutional: No fevers, chills, sweats, or change in appetite Eyes: No visual changes, double vision, eye  pain Ear, nose and throat: No hearing loss, ear pain, nasal congestion, sore throat Cardiovascular: No chest pain, palpitations Respiratory: No shortness of breath at rest or with exertion.   No wheezes GastrointestinaI: No nausea, vomiting, diarrhea, abdominal pain, fecal incontinence Genitourinary: No dysuria, urinary retention or frequency.  No nocturia. Musculoskeletal: No neck pain, back pain Integumentary: No rash, pruritus, skin lesions Neurological: as above Psychiatric: No depression at this time.  No anxiety Endocrine: No palpitations, diaphoresis, change in appetite, change in weigh or increased thirst Hematologic/Lymphatic: No anemia, purpura, petechiae. Allergic/Immunologic: No itchy/runny eyes, nasal congestion, recent allergic reactions, rashes  ALLERGIES: Allergies  Allergen Reactions  . Iron Anaphylaxis    Iv iron  . Ciprofloxacin     Stomach upset  . Contrast Media [Iodinated Diagnostic Agents] Itching    Rash and itching  . Sulfa Antibiotics     Rash and itching  . Tape Rash    Adhesive tape    HOME MEDICATIONS:  Current outpatient prescriptions:  .  ALPRAZolam (XANAX) 0.5 MG tablet, Take 0.5 mg by mouth., Disp: , Rfl:  .  atorvastatin (LIPITOR) 20 MG tablet, Take 20 mg by mouth daily., Disp: , Rfl:  .  busPIRone (BUSPAR) 30 MG tablet, TAKE 1 TABLET THREE TIMES A DAY, Disp: 270 tablet, Rfl: 1 .  EPINEPHrine (EPIPEN) 0.3 mg/0.3 mL DEVI, Inject 0.3 mg into the muscle as needed. , Disp: , Rfl:  .  Ergocalciferol (VITAMIN D2) 2000 UNITS TABS, Take 2,000 Units by mouth daily. , Disp: , Rfl:  .  esomeprazole (NEXIUM) 40 MG capsule, Take 40 mg by mouth 2 (two) times daily. , Disp: , Rfl:  .  ferrous sulfate 325 (65 FE) MG tablet, Take 325 mg by mouth 2 (two) times daily., Disp: , Rfl:  .  FLOVENT HFA 220 MCG/ACT inhaler, Inhale 1 puff into the lungs daily. , Disp: , Rfl: 1 .  Ibuprofen (ADVIL) 200 MG CAPS, Take 2 capsules by mouth 3 (three) times daily as  needed. For migraines, Disp: , Rfl:  .  interferon beta-1b (BETASERON) 0.3 MG injection, Inject 0.25 mg into the skin every other day., Disp: , Rfl:  .  levothyroxine (SYNTHROID, LEVOTHROID) 150 MCG tablet, Take 150 mcg by mouth daily before breakfast., Disp: , Rfl:  .  metoprolol succinate (TOPROL-XL) 50 MG 24 hr tablet, Take 1 tablet (50 mg total) by mouth daily. Take with or immediately following a meal., Disp: 90 tablet, Rfl: 3 .  potassium citrate (UROCIT-K) 10 MEQ (1080 MG) SR tablet, Take 10 mEq by mouth 3 (three) times daily with meals., Disp: , Rfl:  .  pyridOXINE (VITAMIN B-6) 100 MG tablet, Take 100 mg by mouth daily., Disp: ,  Rfl:  .  Pyridoxine HCl (VITAMIN B-6 PO), Take 1 tablet by mouth daily., Disp: , Rfl:  .  ranitidine (ZANTAC) 75 MG tablet, Take 75 mg by mouth at bedtime., Disp: , Rfl:  .  rizatriptan (MAXALT-MLT) 5 MG disintegrating tablet, Take 1 tablet (5 mg total) by mouth as needed for migraine. May repeat in 2 hours if needed, Disp: 10 tablet, Rfl: 5 .  rOPINIRole (REQUIP) 0.25 MG tablet, Take 1 mg by mouth QID. , Disp: , Rfl:  .  rOPINIRole (REQUIP) 1 MG tablet, TAKE 1 TABLET FOUR TIMES A DAY, Disp: 360 tablet, Rfl: 1 .  sertraline (ZOLOFT) 100 MG tablet, Take one and one-half tablets daily, Disp: 135 tablet, Rfl: 1 .  traZODone (DESYREL) 100 MG tablet, Take 150 mg by mouth at bedtime., Disp: , Rfl:   PAST MEDICAL HISTORY: Past Medical History  Diagnosis Date  . GERD (gastroesophageal reflux disease)   . Diverticula of colon   . Multiple sclerosis ([facility])   . Obesity   . Allergy   . Hyperlipidemia   . Anxiety   . Chronic kidney disease     KIDNEY STONES WITH STENTS  . Eczema   . Thyroid disease     hyperthyroidism  . Restless leg syndrome   . Hypercholesterolemia   . Depression   . Migraine headache   . Kidney stones   . PONV (postoperative nausea and vomiting)   . Palpitations   . Hypothyroidism   . Snoring   . Vertigo   . Unsteady gait   . Vision  abnormalities     PAST SURGICAL HISTORY: Past Surgical History  Procedure Laterality Date  . Cholecystectomy    . Lithotripsy    . Nissen fundoplication    . Finger surgery  x8  . Breast surgery      AUGMENTATION  . Hernia repair    . Carpel tunnel    . Dilation and curettage of uterus  1983  . Ventral hernia repair  07/25/2012    Procedure: LAPAROSCOPIC VENTRAL HERNIA;  Surgeon: Adin Hector, MD;  Location: [facility];  Service: General;  Laterality: N/A;    FAMILY HISTORY: Family History  Problem Relation Age of Onset  . Hyperlipidemia Mother   . Glaucoma Mother   . Hypertension Mother   . Heart disease Father   . Cancer Father     prostate  . Diabetes Father   . Dementia Father   . Cancer Brother     esophageal  . Arthritis Sister     RA    SOCIAL HISTORY:  Social History   Social History  . Marital Status: Married    Spouse Name: N/A  . Number of Children: N/A  . Years of Education: N/A   Occupational History  . Not on file.   Social History Main Topics  . Smoking status: Former Research scientist (life sciences)  . Smokeless tobacco: Never Used  . Alcohol Use: No  . Drug Use: No  . Sexual Activity: Not on file   Other Topics Concern  . Not on file   Social History Narrative     PHYSICAL EXAM  Filed Vitals:   10/29/15 1309  BP: 122/70  Pulse: 76  Resp: 16  Height: 4\' 10"  (1.473 m)  Weight: 213 lb (96.616 kg)    Body mass index is 44.53 kg/(m^2).   General: The patient is well-developed and well-nourished and in no acute distress  Musculoskeletal:  Lumbar paraspinal muscles are mildly-tender but more  tender over right piriformis  Neurologic Exam  Mental status: The patient is alert and oriented x 3 at the time of the examination. The patient has apparent normal recent and remote memory, with an apparently normal attention span and concentration ability.   Speech is normal.  Cranial nerves: Extraocular movements are full. Pupils show a 1+ right APD. She has  decreased color vision OS.   Visual fields are full.  Facial symmetry is present. There is good facial sensation to soft touch bilaterally.Facial strength is normal.  Trapezius and sternocleidomastoid strength is normal. No dysarthria is noted.  The tongue is midline, and the patient has symmetric elevation of the soft palate. No obvious hearing deficits are noted.  Motor:  Muscle bulk is normal.   Tone is normal. Strength is  5 / 5 in all 4 extremities.   Sensory: Sensory testing is intact to soft touch.   She reports decreased vibration sensation at the knees but normal at the toes.  Coordination: Cerebellar testing reveals good finger-nose-finger and heel-to-shin bilaterally.  Gait and station: Station is normal.   Gait is wide and tandem is wide.   Romberg is negative.   Reflexes: Deep tendon reflexes are symmetric and normal bilaterally.       DIAGNOSTIC DATA (LABS, IMAGING, TESTING) - I reviewed patient records, labs, notes, testing and imaging myself where available.    ASSESSMENT AND PLAN  Lumbar radicular pain  Lumbosacral facet joint syndrome  Multiple sclerosis (HCC)  Migraine without aura and without status migrainosus, not intractable   1.    Trigger point injection of  Right piriformis muscle with 80 mg depo-Medrol in 5 ml Marcaine she tolerated the procedure well and there were no complications. 2.   Meclizine for vertigo 3.    If LBP pain worsens, consider repeat  radiofrequency ablation of medial branch nerves 5.   She will return to see me in 5 - 6 months or sooner if she has new or worsening neurologic symptoms.  Deondra Wigger A. Felecia Shelling, MD, PhD [id number], [id number] PM Certified in Neurology, Clinical Neurophysiology, Sleep Medicine, Pain Medicine and Neuroimaging  [facility] [address] [place], [place] [address] [phone]

## 2015-10-30 ENCOUNTER — Telehealth: Payer: Self-pay | Admitting: Neurology

## 2015-10-30 ENCOUNTER — Other Ambulatory Visit: Payer: Self-pay | Admitting: Neurology

## 2015-10-30 DIAGNOSIS — M5430 Sciatica, unspecified side: Secondary | ICD-10-CM

## 2015-10-30 DIAGNOSIS — M5416 Radiculopathy, lumbar region: Secondary | ICD-10-CM

## 2015-10-30 NOTE — Telephone Encounter (Signed)
Okay I added the order  Lumbar spine without contrast for sciatica

## 2015-10-30 NOTE — Telephone Encounter (Signed)
Patient would like to know if a MRI of her back can be ordered since she is also having back problems. She is aware that Dr. Felecia Shelling ordered MRI BRAIN WO but she would like to have both orders done at the same time if possible. I would also like to confirm that the MRI BRAIN WO needs to be done Without contrast only and NOT MRI BRAIN W/WO. Please advise. Thank you!

## 2015-11-12 ENCOUNTER — Ambulatory Visit (INDEPENDENT_AMBULATORY_CARE_PROVIDER_SITE_OTHER): Payer: BLUE CROSS/BLUE SHIELD

## 2015-11-12 DIAGNOSIS — G35 Multiple sclerosis: Secondary | ICD-10-CM | POA: Diagnosis not present

## 2015-11-12 DIAGNOSIS — M5430 Sciatica, unspecified side: Secondary | ICD-10-CM | POA: Diagnosis not present

## 2015-11-12 DIAGNOSIS — R42 Dizziness and giddiness: Secondary | ICD-10-CM | POA: Diagnosis not present

## 2015-11-17 ENCOUNTER — Telehealth: Payer: Self-pay | Admitting: *Deleted

## 2015-11-17 NOTE — Addendum Note (Signed)
Addended by: France Ravens I on: 11/17/2015 10:46 AM   Modules accepted: Orders

## 2015-11-17 NOTE — Telephone Encounter (Signed)
LMTC./fim 

## 2015-11-17 NOTE — Telephone Encounter (Signed)
I have spoken with Jocelyn Wilson and per RAS, advised that mri brain is unchanged.  MRI back shows a little worse degen changes at L5-S1.  She verbalized understanding of same, sts. lbp radiates into both buttocks, not into legs.  She would like esi or rf to treat pain.  Will check with RAS and let her know which would be appropriate/fim

## 2015-11-17 NOTE — Telephone Encounter (Signed)
-----   Message from Britt Bottom, MD sent at 11/14/2015  1:17 PM EST ----- I called 11/14/2015 1:15 pm and left message. The MRI of the brain is completely unchanged compared to her prior one. The MRI of the lumbar spine shows degenerative changes at the bottom 2 levels. At L5-S1 there has been a little progression compared to her older MRI.    If back pain is continuing, we could consider referral for ESIs or radiofrequency ablations like we had done in the past.   (find out if pain only back or into legs)

## 2015-11-17 NOTE — Telephone Encounter (Signed)
Patient states she is returning Dr. Garth Bigness call about the results of her MRI and that she is going out of town so she would like to leave other numbers at which to be called.  Please call cell phone # [phone] or [phone].  Thanks!

## 2015-11-17 NOTE — Telephone Encounter (Signed)
I have spoken with Jocelyn Wilson again, and per RAS, advsied rf of L4-L5 and L5-S1 bilat would be appropriate for her pain. She is agreeable.  Order in epic for referral to [facility] for rf procedure/fim

## 2015-11-17 NOTE — Telephone Encounter (Signed)
I have spoken with Jocelyn Wilson again and advised that per RAS, rf ablation of L4-L5 and L5-S1 bilat would be appropriate for her pain.  She verbalized understanding of same, is agreeable.  Order in epic for referral to [facility] for rf/fim

## 2015-11-20 ENCOUNTER — Ambulatory Visit: Payer: BLUE CROSS/BLUE SHIELD | Attending: Neurology

## 2015-11-20 DIAGNOSIS — R42 Dizziness and giddiness: Secondary | ICD-10-CM

## 2015-11-20 DIAGNOSIS — R269 Unspecified abnormalities of gait and mobility: Secondary | ICD-10-CM | POA: Insufficient documentation

## 2015-11-20 DIAGNOSIS — R5381 Other malaise: Secondary | ICD-10-CM | POA: Diagnosis present

## 2015-11-20 DIAGNOSIS — R29898 Other symptoms and signs involving the musculoskeletal system: Secondary | ICD-10-CM | POA: Insufficient documentation

## 2015-11-20 DIAGNOSIS — R2681 Unsteadiness on feet: Secondary | ICD-10-CM | POA: Insufficient documentation

## 2015-11-21 NOTE — Therapy (Signed)
[facility] [address] [place], [place], [address] Phone: [phone]   Fax:  [phone]  Physical Therapy Evaluation  Patient Details  Name: Jocelyn Wilson MRN: [id number] Date of Birth: September 27, 1951 Referring Provider: Dr. Felecia Shelling  Encounter Date: 11/20/2015      PT End of Session - 11/21/15 1004    Visit Number 1   Number of Visits 17   Date for PT Re-Evaluation 01/20/16   Authorization Type BCBS   PT Start Time 1532   PT Stop Time 1622   PT Time Calculation (min) 50 min   Equipment Utilized During Treatment Gait belt   Activity Tolerance Patient tolerated treatment well   Behavior During Therapy [facility] for tasks assessed/performed      Past Medical History  Diagnosis Date  . GERD (gastroesophageal reflux disease)   . Diverticula of colon   . Multiple sclerosis ([place])   . Obesity   . Allergy   . Hyperlipidemia   . Anxiety   . Chronic kidney disease     KIDNEY STONES WITH STENTS  . Eczema   . Thyroid disease     hyperthyroidism  . Restless leg syndrome   . Hypercholesterolemia   . Depression   . Migraine headache   . Kidney stones   . PONV (postoperative nausea and vomiting)   . Palpitations   . Hypothyroidism   . Snoring   . Vertigo   . Unsteady gait   . Vision abnormalities   . Right sided sciatica 10/29/2015    Past Surgical History  Procedure Laterality Date  . Cholecystectomy    . Lithotripsy    . Nissen fundoplication    . Finger surgery  x8  . Breast surgery      AUGMENTATION  . Hernia repair    . Carpel tunnel    . Dilation and curettage of uterus  1983  . Ventral hernia repair  07/25/2012    Procedure: LAPAROSCOPIC VENTRAL HERNIA;  Surgeon: Adin Hector, MD;  Location: [facility];  Service: General;  Laterality: N/A;    There were no vitals filed for this visit.  Visit Diagnosis:  Abnormality of gait - Plan: PT plan of care cert/re-cert  Dizziness and giddiness - Plan: PT plan  of care cert/re-cert  Weakness of both lower extremities - Plan: PT plan of care cert/re-cert  Debility - Plan: PT plan of care cert/re-cert  Unsteadiness - Plan: PT plan of care cert/re-cert      Subjective Assessment - 11/20/15 1548    Subjective Pt reported she has dizziness/balance issues which have become progressively worse over the past year. Pt reported approx. 6 times in the last six months. Pt reports dizziness occurs most of the time, and describes it as NOT spinning but more of a swimmyheaded/"feeling off".  Pt reports motion makes it worse (car and boat sickness), which began as a child. It is better if pt can look/see in the direction the vehicle/boat is going.  Pt feels motion sensitivity describes dizziness best, she had a really difficult time on car trip to [place] and had to use a w/c or cane to amb. Pt reports migraines did not start until she was in her 55's.  At worst: 7-8/10 , at best: 0/10 and eyes closed helps.Pt also has nausea with dizziness.    Pertinent History Hypothyroidism, LBP (2 ablation surgeries and might have a third in the next few months), MS, DM   Patient Stated Goals Not  be so wobbly   Currently in Pain? No/denies  no pain sitting            [facility] PT Assessment - 11/20/15 1605    Assessment   Medical Diagnosis MS, Vertigo   Referring Provider Dr. Felecia Shelling   Onset Date/Surgical Date 11/19/14   Prior Therapy not for balance   Precautions   Precautions Fall   Precaution Comments based on DGI and falls hx   Restrictions   Weight Bearing Restrictions No   Balance Screen   Has the patient fallen in the past 6 months Yes   How many times? 6   Has the patient had a decrease in activity level because of a fear of falling?  Yes   Is the patient reluctant to leave their home because of a fear of falling?  Yes   [place] residence   Living Arrangements Spouse/significant other   Available Help at Discharge  Family   Type of [place] to enter   Entrance Stairs-Number of Steps 4   Entrance Stairs-Rails Right   [place] Two level   Alternate Level Stairs-Number of Steps one flight   Alternate Level Stairs-Rails Can reach both;Left  multi level   [place] - 2 wheels;Cane - single point;Shower seat  handheld shower head   Prior Function   Level of Independence Independent   Vocation Retired   Leisure travel, play with 3y/o granddaughter, quilt, knit, sew (crafts)   Cognition   Overall Cognitive Status Within Functional Limits for tasks assessed   Sensation   Light Touch Appears Intact   Additional Comments No c/o N/T   Coordination   Gross Motor Movements are Fluid and Coordinated Yes   Fine Motor Movements are Fluid and Coordinated Yes   Posture/Postural Control   Posture/Postural Control Postural limitations   Postural Limitations Forward head   ROM / Strength   AROM / PROM / Strength AROM;Strength   AROM   Overall AROM  Within functional limits for tasks performed   Overall AROM Comments B UE/LE WFL   Strength   Overall Strength Deficits   Overall Strength Comments L LE: hip flex: 3+/4, knee ext: 4/5, knee flex: 3+/5, ankle DF: 4/5. R LE with pain during hip flex and knee ext; hip flex: 3+/5, knee ext: 3+/5, knee flex: 3+/5, ankle DF: 3/5. Hip abd/add in seated grossly : 3+5.   Transfers   Transfers Sit to Stand;Stand to Sit   Sit to Stand 5: Supervision;With upper extremity assist;From chair/3-in-1   Stand to Sit 5: Supervision;With upper extremity assist;To chair/3-in-1   Ambulation/Gait   Ambulation/Gait Yes   Ambulation/Gait Assistance 5: Supervision;4: Min assist;4: Min guard   Ambulation/Gait Assistance Details S when amb. in straight trajectory, min guard during turns and min A while stepping over obstacles and during head nods.   Ambulation Distance (Feet) 125 Feet   Assistive device None   Gait Pattern Step-through  pattern;Decreased dorsiflexion - right;Decreased weight shift to right;Decreased stride length;Antalgic   Ambulation Surface Level;Indoor   Gait velocity 1.36ft/sec.  no AD   Standardized Balance Assessment   Standardized Balance Assessment Dynamic Gait Index   Dynamic Gait Index   Level Surface Mild Impairment   Change in Gait Speed Moderate Impairment   Gait with Horizontal Head Turns Mild Impairment   Gait with Vertical Head Turns Severe Impairment   Gait and Pivot Turn Mild Impairment   Step Over  Obstacle Moderate Impairment   Step Around Obstacles Mild Impairment   Steps Moderate Impairment   Total Score 11                           PT Education - 11/21/15 1003    Education provided Yes   Education Details PT discussed frequency/duration and outcome measure results.   Person(s) Educated Patient   Methods Explanation   Comprehension Verbalized understanding          PT Short Term Goals - 11/21/15 1008    PT SHORT TERM GOAL #1   Title Pt will be IND in HEP to improve dizziness, strength and balance. Target date: 12/18/15   Status New   PT SHORT TERM GOAL #2   Title Pt will improve DGI to >/=15/24 to decr. falls risk. Target date: 12/18/15   Status New   PT SHORT TERM GOAL #3   Title Pt will amb. 300' over even terrain at MOD I level to improve functional mobility. Target date: 12/18/15   Status New   PT SHORT TERM GOAL #4   Title Pt will report dizziness will be </=5/10 at worst while performing head turns during amb. to improve safety during gait. Target date: 12/18/15   Status New           PT Long Term Goals - 11/21/15 1010    PT LONG TERM GOAL #1   Title Pt will report no falls over the last 2 weeks of PT to improve safety.Target date: 01/15/16   Status New   PT LONG TERM GOAL #2   Title Pt will improve gait speed without AD to >/=2.83ft/sec. to amb. safely in the community. Target date: 01/15/16   Status New   PT LONG TERM GOAL #3   Title Pt  will improve DGI score to >/=20/24 to decr. falls risk.Target date: 01/15/16   Status New   PT LONG TERM GOAL #4   Title Pt will amb. 500' over even/uneven terrain at MOD I level to improve functional mobility. Target date: 01/15/16   Status New   PT LONG TERM GOAL #5   Title Pt will ascend/descend 8 steps with one handrail with one handrail, in step over pattern, MOD I to traverse stairs at home safely. Target date: 01/15/16   Status New               Plan - 11/21/15 1004    Clinical Impression Statement Pt is a pleasant 65y/o female presenting with history of MS. Upon examination pt experienced the following deficits, gait deviations, impaired balance, decreased strength, decreased endurance, dizziness and postural impairments.Pt's DGI score indicates pt is at risk for falls. PT will perform in depth vestibular assessment next session, as was not able to perform during eval due to time constraints and cues for pt to attend focus to task.    Pt will benefit from skilled therapeutic intervention in order to improve on the following deficits Abnormal gait;Decreased endurance;Pain;Postural dysfunction;Dizziness;Decreased mobility;Decreased balance;Decreased strength;Decreased knowledge of use of DME  PT will not directly address pain but will continue to monitor closely   Rehab Potential Good   Clinical Impairments Affecting Rehab Potential Hx of MS, hx of LBP and pt might have ablation procedure to reduce pain, migraines, DM   PT Frequency 2x / week   PT Duration 8 weeks   PT Treatment/Interventions ADLs/Self Care Home Management;Biofeedback;Canalith Repostioning;Manual techniques;Wheelchair mobility training;Vestibular;Balance training;Therapeutic exercise;Therapeutic activities;Functional mobility training;Stair  training;Gait training;Patient/family education;DME Instruction;Neuromuscular re-education;Energy conservation   PT Next Visit Plan Perform vestibular assessment, provide balance HEP    Consulted and Agree with Plan of Care Patient         Problem List Patient Active Problem List   Diagnosis Date Noted  . Right sided sciatica 10/29/2015  . DM type 2 (diabetes mellitus, type 2) ([place]) 05/28/2015  . SVT (supraventricular tachycardia) ([place]) 01/07/2015  . Morbid obesity ([place]) 01/07/2015  . Hyperlipidemia 01/07/2015  . Multiple sclerosis ([place]) 11/28/2014  . Midline low back pain without sciatica 11/28/2014  . Lumbar radicular pain 11/28/2014  . Lumbosacral facet joint syndrome 11/28/2014  . Restless leg syndrome 11/28/2014  . Common migraine without aura 11/28/2014  . Incisional hernia with obstruction 06/15/2012  . Headache, migraine 01/03/2008  . Clinical depression 01/03/2008  . Anxiety state 01/03/2008    Laikynn Pollio L 11/21/2015, 10:15 AM  [facility] [address] [place], [place], [address] Phone: [phone]   Fax:  [phone]  Name: Jocelyn Wilson MRN: [id number] Date of Birth: 07-May-1951   Geoffry Paradise, PT,DPT 11/21/2015 10:15 AM Phone: [phone] Fax: [phone]

## 2015-11-25 ENCOUNTER — Ambulatory Visit: Payer: BLUE CROSS/BLUE SHIELD

## 2015-11-28 ENCOUNTER — Ambulatory Visit: Payer: BLUE CROSS/BLUE SHIELD

## 2015-12-04 ENCOUNTER — Ambulatory Visit: Payer: BLUE CROSS/BLUE SHIELD

## 2015-12-04 DIAGNOSIS — R269 Unspecified abnormalities of gait and mobility: Secondary | ICD-10-CM | POA: Diagnosis not present

## 2015-12-04 DIAGNOSIS — R2681 Unsteadiness on feet: Secondary | ICD-10-CM

## 2015-12-04 DIAGNOSIS — R42 Dizziness and giddiness: Secondary | ICD-10-CM

## 2015-12-04 NOTE — Therapy (Signed)
[facility] [address] [place], [place], [address] Phone: [phone]   Fax:  [phone]  Physical Therapy Treatment  Patient Details  Name: Jocelyn Wilson MRN: [id number] Date of Birth: 11-11-1950 Referring Provider: Dr. Felecia Shelling  Encounter Date: 12/04/2015      PT End of Session - 12/04/15 1637    Visit Number 2   Number of Visits 17   Date for PT Re-Evaluation 01/20/16   Authorization Type BCBS   PT Start Time 1530   PT Stop Time 1617   PT Time Calculation (min) 47 min   Activity Tolerance Other (comment)  limited by dizziness   Behavior During Therapy [facility] for tasks assessed/performed      Past Medical History  Diagnosis Date  . GERD (gastroesophageal reflux disease)   . Diverticula of colon   . Multiple sclerosis ([place])   . Obesity   . Allergy   . Hyperlipidemia   . Anxiety   . Chronic kidney disease     KIDNEY STONES WITH STENTS  . Eczema   . Thyroid disease     hyperthyroidism  . Restless leg syndrome   . Hypercholesterolemia   . Depression   . Migraine headache   . Kidney stones   . PONV (postoperative nausea and vomiting)   . Palpitations   . Hypothyroidism   . Snoring   . Vertigo   . Unsteady gait   . Vision abnormalities   . Right sided sciatica 10/29/2015    Past Surgical History  Procedure Laterality Date  . Cholecystectomy    . Lithotripsy    . Nissen fundoplication    . Finger surgery  x8  . Breast surgery      AUGMENTATION  . Hernia repair    . Carpel tunnel    . Dilation and curettage of uterus  1983  . Ventral hernia repair  07/25/2012    Procedure: LAPAROSCOPIC VENTRAL HERNIA;  Surgeon: Adin Hector, MD;  Location: [facility];  Service: General;  Laterality: N/A;    There were no vitals filed for this visit.  Visit Diagnosis:  Dizziness and giddiness  Abnormality of gait  Unsteadiness      Subjective Assessment - 12/04/15 1536    Subjective Pt reported she  was sick for about 7 days with the "crud" and feels a little better now (low energy). Pt denied falls. Pt's back is bothering her and feels lightheaded.   Pertinent History Hypothyroidism, LBP (2 ablation surgeries and might have a third in the next few months), MS, DM   Patient Stated Goals Not be so wobbly   Currently in Pain? Yes   Pain Score --  0/10 sitting and 3-4/10   Pain Location Hip   Pain Orientation Left;Right;Mid  glut area   Pain Descriptors / Indicators Sharp   Pain Type Chronic pain   Pain Onset More than a month ago   Pain Frequency Intermittent   Aggravating Factors  standing, walking   Pain Relieving Factors rest                Vestibular Assessment - 12/04/15 1539    Vestibular Assessment   General Observation Pt reports dizziness/lightheadedness incr. with supine to sit and sit to stand.   Symptom Behavior   Type of Dizziness Lightheadedness  and wooziness   Frequency of Dizziness It comes and goes but is worse when weather changes, experiences it 2-3x/week   Duration of Dizziness A few hours  Aggravating Factors Looking up to the ceiling;Turning head quickly;Supine to sit;Sit to stand  prior to onset of HA and with weather changing   Relieving Factors Rest  avoiding movement   Occulomotor Exam   Occulomotor Alignment Normal   Spontaneous Absent   Gaze-induced Absent   Head shaking Horizontal Absent   Head Shaking Vertical Absent   Smooth Pursuits Saccades  especially when tracking in L upper and lower quadrants   Saccades Poor trajectory   Comment Pt reported 3/10 nausea after L saccades and 1/10 nausea after R saccades.   Vestibulo-Occular Reflex   VOR 1 Head Only (x 1 viewing) Increased dizziness 3/5 and had difficulty maintaining focus.                   Vestibular Treatment/Exercise - 12/04/15 0001    Vestibular Treatment/Exercise   Vestibular Treatment Provided Gaze   Gaze Exercises X1 Viewing Horizontal;X1 Viewing Vertical    X1 Viewing Horizontal   Foot Position seated   Time --  30 seconds   Reps 2   Comments Pt reported 4/10 dizziness and required rest between reps.   X1 Viewing Vertical   Foot Position seated    Time --  20-30 sec.   Reps 2   Comments Pt reported "slight" dizziness after vertical gaze stab.                PT Education - 12/04/15 1634    Education provided Yes   Education Details PT spent extensive time explaining vestibular testing benefits and demonstrated the positional testing. Pt reported she was fearful of performing positional testing as a doctor performed it years ago and made her very dizzy. PT explained treatment and why positonal testing is performed, pt stated she will try it at another time. PT stated pt does not have to perform testing she is not comfortable performing but that it is difficult to assess cause of dizziness.  PT provided pt with gaze stab. HEP.   Person(s) Educated Patient   Methods Explanation;Demonstration;Verbal cues;Handout   Comprehension Returned demonstration;Verbalized understanding          PT Short Term Goals - 12/04/15 1644    PT SHORT TERM GOAL #1   Title Pt will be IND in HEP to improve dizziness, strength and balance. Target date: 12/18/15   Status On-going   PT SHORT TERM GOAL #2   Title Pt will improve DGI to >/=15/24 to decr. falls risk. Target date: 12/18/15   Status On-going   PT SHORT TERM GOAL #3   Title Pt will amb. 300' over even terrain at MOD I level to improve functional mobility. Target date: 12/18/15   Status On-going   PT SHORT TERM GOAL #4   Title Pt will report dizziness will be </=5/10 at worst while performing head turns during amb. to improve safety during gait. Target date: 12/18/15   Status On-going           PT Long Term Goals - 12/04/15 1644    PT LONG TERM GOAL #1   Title Pt will report no falls over the last 2 weeks of PT to improve safety.Target date: 01/15/16   Status On-going   PT LONG TERM GOAL #2    Title Pt will improve gait speed without AD to >/=2.66ft/sec. to amb. safely in the community. Target date: 01/15/16   Status On-going   PT LONG TERM GOAL #3   Title Pt will improve DGI score to >/=20/24 to decr.  falls risk.Target date: 01/15/16   Status On-going   PT LONG TERM GOAL #4   Title Pt will amb. 500' over even/uneven terrain at MOD I level to improve functional mobility. Target date: 01/15/16   Status On-going   PT LONG TERM GOAL #5   Title Pt will ascend/descend 8 steps with one handrail with one handrail, in step over pattern, MOD I to traverse stairs at home safely. Target date: 01/15/16   Status On-going               Plan - 12/04/15 1639    Clinical Impression Statement Pt's vesitbular assessment limited as pt declined positional testing 2/2 fear of provoking dizziness; she had a bad experience with positional testing at MD office. Pt's symptoms consistent with hypofunctioning of vestibular system (L>R). Pt would continue to benefit from skilled PT to improve safety during functional mobility. Pt continues to require cues to maintain focus. PT encouraged pt to have a driver when she agrees to have positional testing performed.   Pt will benefit from skilled therapeutic intervention in order to improve on the following deficits Abnormal gait;Decreased endurance;Pain;Postural dysfunction;Dizziness;Decreased mobility;Decreased balance;Decreased strength;Decreased knowledge of use of DME   Rehab Potential Good   Clinical Impairments Affecting Rehab Potential Hx of MS, hx of LBP and pt might have ablation procedure to reduce pain, migraines, DM   PT Frequency 2x / week   PT Duration 8 weeks   PT Treatment/Interventions ADLs/Self Care Home Management;Biofeedback;Canalith Repostioning;Manual techniques;Wheelchair mobility training;Vestibular;Balance training;Therapeutic exercise;Therapeutic activities;Functional mobility training;Stair training;Gait training;Patient/family  education;DME Instruction;Neuromuscular re-education;Energy conservation   PT Next Visit Plan Perform orthostatic hypotension testing, positional testing if pt agrees and provide balance HEP (for vestibular)   PT Home Exercise Plan Gaze stab. HEP.   Consulted and Agree with Plan of Care Patient        Problem List Patient Active Problem List   Diagnosis Date Noted  . Right sided sciatica 10/29/2015  . DM type 2 (diabetes mellitus, type 2) ([place]) 05/28/2015  . SVT (supraventricular tachycardia) ([place]) 01/07/2015  . Morbid obesity ([place]) 01/07/2015  . Hyperlipidemia 01/07/2015  . Multiple sclerosis ([place]) 11/28/2014  . Midline low back pain without sciatica 11/28/2014  . Lumbar radicular pain 11/28/2014  . Lumbosacral facet joint syndrome 11/28/2014  . Restless leg syndrome 11/28/2014  . Common migraine without aura 11/28/2014  . Incisional hernia with obstruction 06/15/2012  . Headache, migraine 01/03/2008  . Clinical depression 01/03/2008  . Anxiety state 01/03/2008    Tallis Soledad L 12/04/2015, 4:45 PM  [facility] [address] [place], [place], [address] Phone: [phone]   Fax:  [phone]  Name: Jocelyn Wilson MRN: [id number] Date of Birth: 06/16/1951    Geoffry Paradise, PT,DPT 12/04/2015 4:45 PM Phone: [phone] Fax: [phone]

## 2015-12-04 NOTE — Patient Instructions (Signed)
Gaze Stabilization: Tip Card  1.Target must remain in focus, not blurry, and appear stationary while head is in motion. 2.Perform exercises with small head movements (45 to either side of midline). 3.Increase speed of head motion so long as target is in focus. 4.If you wear eyeglasses, be sure you can see target through lens (therapist will give specific instructions for bifocal / progressive lenses). 5.These exercises may provoke dizziness or nausea. Work through these symptoms. If too dizzy, slow head movement slightly. Rest between each exercise. 6.Exercises demand concentration; avoid distractions. 7.For safety, perform standing exercises close to a counter, wall, corner, or next to someone. If dizziness increases to >5-6/10 stop, and either slow head motion and/or decrease range of head movements.  Copyright  VHI. All rights reserved.    Gaze Stabilization: Sitting    Keeping eyes on target on wall 3-5 feet away, tilt head down 15-30 and move head side to side for __20-30__ seconds. Repeat while moving head up and down for _30-30___ seconds. Do _3___ sessions per day.   Copyright  VHI. All rights reserved.

## 2015-12-05 ENCOUNTER — Ambulatory Visit: Payer: BLUE CROSS/BLUE SHIELD

## 2015-12-11 NOTE — Telephone Encounter (Signed)
Patient is calling back. She has not heard from anyone about the test she is supposed to have and also has anyone checked her insurance for coverage for the test. The patient also wants to know what test it is. Please call to discuss.

## 2015-12-11 NOTE — Telephone Encounter (Signed)
PC with Jocelyn Wilson.  Referral to GI for RF for back pain was ordered on 11-17-15, but she has not heard from anyone to schedule this.  I spoke with Dana--for some reason referral did not cross over to her computer. I have printed the order for her and she will work on this today.  I let Alana know this is being taken care of/fim

## 2015-12-12 ENCOUNTER — Other Ambulatory Visit: Payer: Self-pay | Admitting: Neurology

## 2015-12-12 ENCOUNTER — Ambulatory Visit: Payer: BLUE CROSS/BLUE SHIELD | Attending: Neurology | Admitting: Physical Therapy

## 2015-12-12 DIAGNOSIS — R2681 Unsteadiness on feet: Secondary | ICD-10-CM | POA: Diagnosis present

## 2015-12-12 DIAGNOSIS — R269 Unspecified abnormalities of gait and mobility: Secondary | ICD-10-CM | POA: Diagnosis present

## 2015-12-12 DIAGNOSIS — R29898 Other symptoms and signs involving the musculoskeletal system: Secondary | ICD-10-CM | POA: Diagnosis present

## 2015-12-12 DIAGNOSIS — R42 Dizziness and giddiness: Secondary | ICD-10-CM | POA: Insufficient documentation

## 2015-12-12 DIAGNOSIS — M5416 Radiculopathy, lumbar region: Secondary | ICD-10-CM

## 2015-12-12 DIAGNOSIS — R5381 Other malaise: Secondary | ICD-10-CM | POA: Insufficient documentation

## 2015-12-12 NOTE — Therapy (Signed)
[facility] [address] [place], [place], [address] Phone: [phone]   Fax:  [phone]  Physical Therapy Treatment  Patient Details  Name: Jocelyn Wilson MRN: [id number] Date of Birth: Sep 03, 1951 Referring Provider: Dr. Felecia Shelling  Encounter Date: 12/12/2015      PT End of Session - 12/12/15 1014    Visit Number 3   Number of Visits 17   Date for PT Re-Evaluation 01/20/16   PT Start Time 0931   PT Stop Time 1014   PT Time Calculation (min) 43 min   Activity Tolerance Patient tolerated treatment well   Behavior During Therapy [facility] for tasks assessed/performed      Past Medical History  Diagnosis Date  . GERD (gastroesophageal reflux disease)   . Diverticula of colon   . Multiple sclerosis ([place])   . Obesity   . Allergy   . Hyperlipidemia   . Anxiety   . Chronic kidney disease     KIDNEY STONES WITH STENTS  . Eczema   . Thyroid disease     hyperthyroidism  . Restless leg syndrome   . Hypercholesterolemia   . Depression   . Migraine headache   . Kidney stones   . PONV (postoperative nausea and vomiting)   . Palpitations   . Hypothyroidism   . Snoring   . Vertigo   . Unsteady gait   . Vision abnormalities   . Right sided sciatica 10/29/2015    Past Surgical History  Procedure Laterality Date  . Cholecystectomy    . Lithotripsy    . Nissen fundoplication    . Finger surgery  x8  . Breast surgery      AUGMENTATION  . Hernia repair    . Carpel tunnel    . Dilation and curettage of uterus  1983  . Ventral hernia repair  07/25/2012    Procedure: LAPAROSCOPIC VENTRAL HERNIA;  Surgeon: Adin Hector, MD;  Location: [facility];  Service: General;  Laterality: N/A;    There were no vitals filed for this visit.  Visit Diagnosis:  Dizziness and giddiness  Abnormality of gait  Unsteadiness  Weakness of both lower extremities  Debility      Subjective Assessment - 12/12/15 0934    Subjective  continues to have balance issues; using cane today.  no falls to report.  "allergies are killing me."   Patient Stated Goals Not be so wobbly   Currently in Pain? No/denies                Vestibular Assessment - 12/12/15 0936    Symptom Behavior   Type of Dizziness Imbalance   Frequency of Dizziness being unsteady happens every day; dizziness 1-2x/wk   Aggravating Factors No known aggravating factors;Comment  pressure changes   Relieving Factors Rest   Positional Testing   Dix-Hallpike Dix-Hallpike Left;Dix-Hallpike Right   Horizontal Canal Testing Horizontal Canal Right;Horizontal Canal Left   Dix-Hallpike Right   Dix-Hallpike Right Duration none   Dix-Hallpike Right Symptoms No nystagmus   Dix-Hallpike Left   Dix-Hallpike Left Duration none   Dix-Hallpike Left Symptoms No nystagmus   Horizontal Canal Right   Horizontal Canal Right Duration none   Horizontal Canal Right Symptoms Normal   Horizontal Canal Left   Horizontal Canal Left Duration none   Horizontal Canal Left Symptoms Normal   Orthostatics   BP supine (x 5 minutes) 110/67 mmHg   HR supine (x 5 minutes) 81   BP standing (after 1  minute) 107/68 mmHg   HR standing (after 1 minute) 88   BP standing (after 3 minutes) 106/73 mmHg   HR standing (after 3 minutes) 67       Feet apart: on compliant surface with EC horizontal and vertical head turns x 10; on level ground with EC 5x10 sec                    PT Short Term Goals - 12/04/15 1644    PT SHORT TERM GOAL #1   Title Pt will be IND in HEP to improve dizziness, strength and balance. Target date: 12/18/15   Status On-going   PT SHORT TERM GOAL #2   Title Pt will improve DGI to >/=15/24 to decr. falls risk. Target date: 12/18/15   Status On-going   PT SHORT TERM GOAL #3   Title Pt will amb. 300' over even terrain at MOD I level to improve functional mobility. Target date: 12/18/15   Status On-going   PT SHORT TERM GOAL #4   Title Pt will  report dizziness will be </=5/10 at worst while performing head turns during amb. to improve safety during gait. Target date: 12/18/15   Status On-going           PT Long Term Goals - 12/04/15 1644    PT LONG TERM GOAL #1   Title Pt will report no falls over the last 2 weeks of PT to improve safety.Target date: 01/15/16   Status On-going   PT LONG TERM GOAL #2   Title Pt will improve gait speed without AD to >/=2.63ft/sec. to amb. safely in the community. Target date: 01/15/16   Status On-going   PT LONG TERM GOAL #3   Title Pt will improve DGI score to >/=20/24 to decr. falls risk.Target date: 01/15/16   Status On-going   PT LONG TERM GOAL #4   Title Pt will amb. 500' over even/uneven terrain at MOD I level to improve functional mobility. Target date: 01/15/16   Status On-going   PT LONG TERM GOAL #5   Title Pt will ascend/descend 8 steps with one handrail with one handrail, in step over pattern, MOD I to traverse stairs at home safely. Target date: 01/15/16   Status On-going               Plan - 12/12/15 1014    Clinical Impression Statement Positional testing and orthostatic hypotension negative.  Pt with heavy reliance on vision for balance and increased postural sway with eyes closed.  Initiated balance exercises today.   PT Next Visit Plan review HEP; progress as tolerated   Consulted and Agree with Plan of Care Patient        Problem List Patient Active Problem List   Diagnosis Date Noted  . Right sided sciatica 10/29/2015  . DM type 2 (diabetes mellitus, type 2) ([place]) 05/28/2015  . SVT (supraventricular tachycardia) ([place]) 01/07/2015  . Morbid obesity ([place]) 01/07/2015  . Hyperlipidemia 01/07/2015  . Multiple sclerosis ([place]) 11/28/2014  . Midline low back pain without sciatica 11/28/2014  . Lumbar radicular pain 11/28/2014  . Lumbosacral facet joint syndrome 11/28/2014  . Restless leg syndrome 11/28/2014  . Common migraine without aura 11/28/2014  . Incisional  hernia with obstruction 06/15/2012  . Headache, migraine 01/03/2008  . Clinical depression 01/03/2008  . Anxiety state 01/03/2008   Laureen Abrahams, PT, DPT 12/12/2015 10:16 AM  [facility] [address] [place], [place],  [id number] Phone: [phone]   Fax:  [phone]  Name: Jocelyn Wilson MRN: [id number] Date of Birth: 1950/10/29

## 2015-12-12 NOTE — Patient Instructions (Signed)
Feet Apart (Compliant Surface) Head Motion - Eyes Open    With eyes open, standing on compliant surface: _pillow_______, feet shoulder width apart, move head slowly: up and down 10 times.  Repeat moving head side to side 10 times. Repeat __1__ times per session. Do _2-3___ sessions per day.  Copyright  VHI. All rights reserved.   Feet Apart, Varied Arm Positions - Eyes Closed    Stand with feet shoulder width apart and arms out. Close eyes and stand still. Hold _10___ seconds. Repeat __3-5__ times per session. Do __2-3__ sessions per day.  Copyright  VHI. All rights reserved.

## 2015-12-16 ENCOUNTER — Ambulatory Visit: Payer: BLUE CROSS/BLUE SHIELD

## 2015-12-16 ENCOUNTER — Other Ambulatory Visit: Payer: Self-pay | Admitting: Neurology

## 2015-12-16 DIAGNOSIS — R42 Dizziness and giddiness: Secondary | ICD-10-CM

## 2015-12-16 DIAGNOSIS — M5416 Radiculopathy, lumbar region: Secondary | ICD-10-CM

## 2015-12-16 DIAGNOSIS — R2681 Unsteadiness on feet: Secondary | ICD-10-CM

## 2015-12-16 DIAGNOSIS — R269 Unspecified abnormalities of gait and mobility: Secondary | ICD-10-CM

## 2015-12-16 NOTE — Therapy (Signed)
[facility] [address] [place], [place], [address] Phone: [phone]   Fax:  [phone]  Physical Therapy Treatment  Patient Details  Name: Jocelyn Wilson MRN: [id number] Date of Birth: [birth year] Referring Provider: Dr. Felecia Shelling  Encounter Date: 12/16/2015      PT End of Session - 12/16/15 1621    Visit Number 4   Number of Visits 17   Date for PT Re-Evaluation 01/20/16   Authorization Type BCBS   PT Start Time [id number]   PT Stop Time 1615   PT Time Calculation (min) 44 min   Equipment Utilized During Treatment --  SOT harness   Activity Tolerance Patient tolerated treatment well   Behavior During Therapy [facility] for tasks assessed/performed      Past Medical History  Diagnosis Date  . GERD (gastroesophageal reflux disease)   . Diverticula of colon   . Multiple sclerosis ([place])   . Obesity   . Allergy   . Hyperlipidemia   . Anxiety   . Chronic kidney disease     KIDNEY STONES WITH STENTS  . Eczema   . Thyroid disease     hyperthyroidism  . Restless leg syndrome   . Hypercholesterolemia   . Depression   . Migraine headache   . Kidney stones   . PONV (postoperative nausea and vomiting)   . Palpitations   . Hypothyroidism   . Snoring   . Vertigo   . Unsteady gait   . Vision abnormalities   . Right sided sciatica 10/29/2015    Past Surgical History  Procedure Laterality Date  . Cholecystectomy    . Lithotripsy    . Nissen fundoplication    . Finger surgery  x8  . Breast surgery      AUGMENTATION  . Hernia repair    . Carpel tunnel    . Dilation and curettage of uterus  1983  . Ventral hernia repair  07/25/2012    Procedure: LAPAROSCOPIC VENTRAL HERNIA;  Surgeon: Adin Hector, MD;  Location: [facility];  Service: General;  Laterality: N/A;    There were no vitals filed for this visit.  Visit Diagnosis:  Abnormality of gait  Dizziness and giddiness  Unsteadiness      Subjective Assessment  - 12/16/15 1538    Subjective Pt denied falls since last visit. Pt reported she started taking her asthma medication again about 1 week ago, and reported she had a sore throat. She stopped the medication and her sore throat ceased. Pt reported no dizziness at rest but does have a dull HA starting. Pt asked if MS or being overweight is affecting balance more.    Pertinent History Hypothyroidism, LBP (2 ablation surgeries and might have a third in the next few months), MS, DM   Patient Stated Goals Not be so wobbly   Currently in Pain? Yes   Pain Score --  1-2/10   Pain Location Head   Pain Descriptors / Indicators Headache   Pain Type Acute pain   Pain Onset Today   Pain Frequency Intermittent   Aggravating Factors  the weather (rain)   Pain Relieving Factors rest               Neuro re-ed: Neuro re-ed: sensory organization test performed with following results: Conditions: 1:  2 trials WNL and 1 below normal 2:  2 trials WNL and 1 below normal 3:  2 trials WNL and 1 below normal 4: 3 trials below normal  5: 3 trial below normal 6: 1 fall, 1 WNL and 1 below normal Composite score:  58 Sensory Analysis Som: WNL Vis: Below normal (~71) Vest: Below normal (~45) Pref: Below normal (~85) Strategy analysis:      Pt more hip dominant COG alignment:      Pt with tendency to lean laterally to L side, in ant/post directions.  Pt required standing rest breaks during SOT 2/2 low back pain. Pt reported 2/10 dizziness upon stepping into NeuroCom machine and 0/10 after testing.               Self Care:     PT Education - 12/16/15 1619    Education provided Yes   Education Details PT discussed SOT results and educated pt on treatment for unsteadiness/dizziness regardless if dizziness is peripheral or central in nature. PT encouraged pt to continue HEP to improve balance and dizziness, and that PT would progress HEP and balance activities during session to focus on  improving visual and vestibular input, based on SOt results.   Person(s) Educated Patient   Methods Explanation   Comprehension Verbalized understanding          PT Short Term Goals - 12/04/15 1644    PT SHORT TERM GOAL #1   Title Pt will be IND in HEP to improve dizziness, strength and balance. Target date: 12/18/15   Status On-going   PT SHORT TERM GOAL #2   Title Pt will improve DGI to >/=15/24 to decr. falls risk. Target date: 12/18/15   Status On-going   PT SHORT TERM GOAL #3   Title Pt will amb. 300' over even terrain at MOD I level to improve functional mobility. Target date: 12/18/15   Status On-going   PT SHORT TERM GOAL #4   Title Pt will report dizziness will be </=5/10 at worst while performing head turns during amb. to improve safety during gait. Target date: 12/18/15   Status On-going           PT Long Term Goals - 12/16/15 1624    PT LONG TERM GOAL #1   Title Pt will report no falls over the last 2 weeks of PT to improve safety.Target date: 01/15/16   Status On-going   PT LONG TERM GOAL #2   Title Pt will improve gait speed without AD to >/=2.38ft/sec. to amb. safely in the community. Target date: 01/15/16   Status On-going   PT LONG TERM GOAL #3   Title Pt will improve DGI score to >/=20/24 to decr. falls risk.Target date: 01/15/16   Status On-going   PT LONG TERM GOAL #4   Title Pt will amb. 500' over even/uneven terrain at MOD I level to improve functional mobility. Target date: 01/15/16   Status On-going   PT LONG TERM GOAL #5   Title Pt will ascend/descend 8 steps with one handrail with one handrail, in step over pattern, MOD I to traverse stairs at home safely. Target date: 01/15/16   Status On-going   Additional Long Term Goals   Additional Long Term Goals Yes   PT LONG TERM GOAL #6   Title Pt will improve SOT score to WNL of age group (>/= 62) to improve balance. Target date: 01/15/16   Status New               Plan - 12/16/15 1622    Clinical Impression  Statement Pt's SOT results are consistent with indicating that pt experiences decr. visual and vestibular input.  SOT preference results indicate pt has a difficult time determining which balance system to utilize (somatosensory, visual, or vestibular). Continue with POC.    Pt will benefit from skilled therapeutic intervention in order to improve on the following deficits Abnormal gait;Decreased endurance;Pain;Postural dysfunction;Dizziness;Decreased mobility;Decreased balance;Decreased strength;Decreased knowledge of use of DME   Rehab Potential Good   Clinical Impairments Affecting Rehab Potential Hx of MS, hx of LBP and pt might have ablation procedure to reduce pain, migraines, DM   PT Frequency 2x / week   PT Duration 8 weeks   PT Treatment/Interventions ADLs/Self Care Home Management;Biofeedback;Canalith Repostioning;Manual techniques;Wheelchair mobility training;Vestibular;Balance training;Therapeutic exercise;Therapeutic activities;Functional mobility training;Stair training;Gait training;Patient/family education;DME Instruction;Neuromuscular re-education;Energy conservation   PT Next Visit Plan Assess STGs and perform balance activities to improve visual/vestibular input.   PT Home Exercise Plan Gaze stab. and balance HEP.   Consulted and Agree with Plan of Care Patient        Problem List Patient Active Problem List   Diagnosis Date Noted  . Right sided sciatica 10/29/2015  . DM type 2 (diabetes mellitus, type 2) ([place]) 05/28/2015  . SVT (supraventricular tachycardia) ([place]) 01/07/2015  . Morbid obesity ([place]) 01/07/2015  . Hyperlipidemia 01/07/2015  . Multiple sclerosis ([place]) 11/28/2014  . Midline low back pain without sciatica 11/28/2014  . Lumbar radicular pain 11/28/2014  . Lumbosacral facet joint syndrome 11/28/2014  . Restless leg syndrome 11/28/2014  . Common migraine without aura 11/28/2014  . Incisional hernia with obstruction 06/15/2012  . Headache, migraine 01/03/2008   . Clinical depression 01/03/2008  . Anxiety state 01/03/2008    Niaya Hickok L 12/16/2015, 4:26 PM  [facility] [address] [place], [place], [address] Phone: [phone]   Fax:  [phone]  Name: Chalita Boughman MRN: [id number] Date of Birth: September 15, 1951    Geoffry Paradise, PT,DPT 12/16/2015 4:26 PM Phone: [phone] Fax: [phone]

## 2015-12-18 ENCOUNTER — Ambulatory Visit: Payer: BLUE CROSS/BLUE SHIELD

## 2015-12-18 DIAGNOSIS — R42 Dizziness and giddiness: Secondary | ICD-10-CM | POA: Diagnosis not present

## 2015-12-18 DIAGNOSIS — R269 Unspecified abnormalities of gait and mobility: Secondary | ICD-10-CM

## 2015-12-18 NOTE — Therapy (Signed)
[facility] [address] [place], [place], [address] Phone: [phone]   Fax:  [phone]  Physical Therapy Treatment  Patient Details  Name: Jocelyn Wilson MRN: [id number] Date of Birth: 05-22-1951 Referring Provider: Dr. Felecia Shelling  Encounter Date: 12/18/2015      PT End of Session - 12/18/15 1624    Visit Number 5   Number of Visits 17   Date for PT Re-Evaluation 01/20/16   Authorization Type BCBS   PT Start Time 1534   PT Stop Time 1613   PT Time Calculation (min) 39 min   Equipment Utilized During Treatment Gait belt   Activity Tolerance Patient tolerated treatment well   Behavior During Therapy [facility] for tasks assessed/performed      Past Medical History  Diagnosis Date  . GERD (gastroesophageal reflux disease)   . Diverticula of colon   . Multiple sclerosis ([place])   . Obesity   . Allergy   . Hyperlipidemia   . Anxiety   . Chronic kidney disease     KIDNEY STONES WITH STENTS  . Eczema   . Thyroid disease     hyperthyroidism  . Restless leg syndrome   . Hypercholesterolemia   . Depression   . Migraine headache   . Kidney stones   . PONV (postoperative nausea and vomiting)   . Palpitations   . Hypothyroidism   . Snoring   . Vertigo   . Unsteady gait   . Vision abnormalities   . Right sided sciatica 10/29/2015    Past Surgical History  Procedure Laterality Date  . Cholecystectomy    . Lithotripsy    . Nissen fundoplication    . Finger surgery  x8  . Breast surgery      AUGMENTATION  . Hernia repair    . Carpel tunnel    . Dilation and curettage of uterus  1983  . Ventral hernia repair  07/25/2012    Procedure: LAPAROSCOPIC VENTRAL HERNIA;  Surgeon: Adin Hector, MD;  Location: [facility];  Service: General;  Laterality: N/A;    There were no vitals filed for this visit.  Visit Diagnosis:  Dizziness and giddiness  Abnormality of gait      Subjective Assessment - 12/18/15 1537     Subjective Pt denied falls since last visit. Pt reported she was very cognizant of when she felt dizzy since last visit. Pt especially felt dizzy when looking up and fixing her hair. Pt reported fatigue yesterday and a little today. Pt reported 0-1/10 dizziness at rest. Pt reported her back pain is really bothering her today.    Pertinent History Hypothyroidism, LBP (2 ablation surgeries and might have a third in the next few months), MS, DM   Patient Stated Goals Not be so wobbly   Currently in Pain? Yes   Pain Score 5    Pain Location Back   Pain Orientation Lower   Pain Descriptors / Indicators Aching   Pain Radiating Towards buttocks   Pain Onset More than a month ago   Pain Frequency Constant   Aggravating Factors  standing/walking   Pain Relieving Factors rest                         [facility] Adult PT Treatment/Exercise - 12/18/15 1603    Ambulation/Gait   Ambulation/Gait Yes   Ambulation/Gait Assistance 5: Supervision;4: Min guard   Ambulation/Gait Assistance Details Min guard while performing turns to ensure safety.  Pt noted to amb. with decr. trunk/hip rotation. Pt reported 3-4/10 dizziness while performing head turns.  Cues to improve stride length, upright posture, and trunk rotation.   Ambulation Distance (Feet) 75 Feet  x2, 115'   Assistive device None   Gait Pattern Step-through pattern;Decreased dorsiflexion - right;Decreased weight shift to right;Decreased stride length;Antalgic;Decreased trunk rotation   Ambulation Surface Level;Indoor   Standardized Balance Assessment   Standardized Balance Assessment Dynamic Gait Index   Dynamic Gait Index   Level Surface Mild Impairment   Change in Gait Speed Mild Impairment   Gait with Horizontal Head Turns Mild Impairment   Gait with Vertical Head Turns Mild Impairment   Gait and Pivot Turn Moderate Impairment   Step Over Obstacle Mild Impairment   Step Around Obstacles Normal   Steps Moderate Impairment   Total  Score 15   Manual Therapy   Manual Therapy Other (comment)   Manual therapy comments Pt in supine position, PT performed the following stretches with overpressure in B directions: lower trunk rotation and piriformis stretches with 45sec. to 1 minute holds, 3 reps/activity. Pt denied incr. in pain during/after manual therapy and stated "it feels good to stretch".           Self Care:     PT Education - 12/18/15 1622    Education provided Yes   Education Details PT reiterated the importance of performing HEP as prescribed. PT provided pt with new stretches to improve trunk/hip rotation during gait and to reduce back pain. PT also provided pt with education on proper transfer techniques and sleeping positions to reduce back pain.    Person(s) Educated Patient   Methods Explanation;Demonstration;Verbal cues;Tactile cues;Handout   Comprehension Returned demonstration;Verbalized understanding;Need further instruction          PT Short Term Goals - 12/18/15 1627    PT SHORT TERM GOAL #1   Title Pt will be IND in HEP to improve dizziness, strength and balance. Target date: 12/18/15   Status On-going   PT SHORT TERM GOAL #2   Title Pt will improve DGI to >/=15/24 to decr. falls risk. Target date: 12/18/15   Status Achieved   PT SHORT TERM GOAL #3   Title Pt will amb. 300' over even terrain at MOD I level to improve functional mobility. Target date: 12/18/15   Status Not Met   PT SHORT TERM GOAL #4   Title Pt will report dizziness will be </=5/10 at worst while performing head turns during amb. to improve safety during gait. Target date: 12/18/15   Status Achieved           PT Long Term Goals - 12/18/15 1629    PT LONG TERM GOAL #1   Title Pt will report no falls over the last 2 weeks of PT to improve safety.Target date: 01/15/16   Status On-going   PT LONG TERM GOAL #2   Title Pt will improve gait speed without AD to >/=2.66f/sec. to amb. safely in the community. Target date: 01/15/16    Status On-going   PT LONG TERM GOAL #3   Title Pt will improve DGI score to >/=20/24 to decr. falls risk.Target date: 01/15/16   Status On-going   PT LONG TERM GOAL #4   Title Pt will amb. 500' over even/uneven terrain at MOD I level to improve functional mobility. Target date: 01/15/16   Status On-going   PT LONG TERM GOAL #5   Title Pt will ascend/descend 8 steps with one handrail  with one handrail, in step over pattern, MOD I to traverse stairs at home safely. Target date: 01/15/16   Status On-going   Additional Long Term Goals   Additional Long Term Goals Yes   PT LONG TERM GOAL #6   Title Pt will improve SOT score to WNL of age group (>/= 82) to improve balance. Target date: 01/15/16   Status On-going   PT LONG TERM GOAL #7   Title Pt will report </=2/10 dizziness during 300' of amb. while performing head turns in order to improve functional mobility. Target date; 01/15/16   Status New               Plan - 12/18/15 1624    Clinical Impression Statement Pt demonstrated progress, as she met STGs 2 and 4, pt did not meet STG 3. PT will assess STG 1 next session. Pt continues to experience incr. postural sway during gait and would continue to benefit from skilled PT to improve safety during functional mobility.   Pt will benefit from skilled therapeutic intervention in order to improve on the following deficits Abnormal gait;Decreased endurance;Pain;Postural dysfunction;Dizziness;Decreased mobility;Decreased balance;Decreased strength;Decreased knowledge of use of DME   Rehab Potential Good   Clinical Impairments Affecting Rehab Potential Hx of MS, hx of LBP and pt might have ablation procedure to reduce pain, migraines, DM   PT Frequency 2x / week   PT Duration 8 weeks   PT Treatment/Interventions ADLs/Self Care Home Management;Biofeedback;Canalith Repostioning;Manual techniques;Wheelchair mobility training;Vestibular;Balance training;Therapeutic exercise;Therapeutic  activities;Functional mobility training;Stair training;Gait training;Patient/family education;DME Instruction;Neuromuscular re-education;Energy conservation   PT Next Visit Plan Assess STG 1 and progress as tolerated.    PT Home Exercise Plan Gaze stab. and balance HEP.   Consulted and Agree with Plan of Care Patient        Problem List Patient Active Problem List   Diagnosis Date Noted  . Right sided sciatica 10/29/2015  . DM type 2 (diabetes mellitus, type 2) ([place]) 05/28/2015  . SVT (supraventricular tachycardia) ([place]) 01/07/2015  . Morbid obesity ([place]) 01/07/2015  . Hyperlipidemia 01/07/2015  . Multiple sclerosis ([place]) 11/28/2014  . Midline low back pain without sciatica 11/28/2014  . Lumbar radicular pain 11/28/2014  . Lumbosacral facet joint syndrome 11/28/2014  . Restless leg syndrome 11/28/2014  . Common migraine without aura 11/28/2014  . Incisional hernia with obstruction 06/15/2012  . Headache, migraine 01/03/2008  . Clinical depression 01/03/2008  . Anxiety state 01/03/2008    Mikle Sternberg L 12/18/2015, 4:31 PM  [facility] [address] [place], [place], [address] Phone: [phone]   Fax:  [phone]  Name: Jocelyn Wilson MRN: [id number] Date of Birth: 08-06-1951    Geoffry Paradise, PT,DPT 12/18/2015 4:31 PM Phone: [phone] Fax: [phone]

## 2015-12-18 NOTE — Patient Instructions (Signed)
Lower Trunk Rotation Stretch    Keeping back flat and feet together, rotate knees to left side. Hold _30___ seconds. Repeat with legs to the other side. Repeat _3___ times per set. Do _2-3___ sessions per day.  http://orth.exer.us/123   Copyright  VHI. All rights reserved.    Piriformis Stretch, Supine    Lie supine, one ankle crossed onto opposite knee. Keep foot flat on bed/mat, and push top knee away from you. Hold __30_ seconds. For deeper stretch gently push top knee away from body.  Repeat _3__ times per session per leg. Do _2-3__ sessions per day.  Copyright  VHI. All rights reserved.   1) Try sleeping on your side with a body pillow, DON'T sleep on stomach. 2) Get in bed by lying on your side and bring legs up into bed at the same time, get out of bed by rolling onto your side and push up with arms while bringing legs off edge of bed.

## 2015-12-19 ENCOUNTER — Telehealth: Payer: Self-pay | Admitting: Neurology

## 2015-12-19 NOTE — Telephone Encounter (Signed)
I spoke to Mrs. Jocelyn Wilson to discuss pain management procedures.  At [facility], I was doing fluoro guided pain management procedures including RFA.   About 5 years ago, I had done medial branch blocks before her first radiofrequency ablation.    She had a good short-term response to the medial branch blocks and then had long-term (at least 9 months) response to radiofrequency ablation. RFA was repeated 2 or 3 more times over the last 5 years, each time with a good result.   When I reviewed this with her, she did remember that she did have medial branch blocks before her first radiofrequency ablation.   Due to her excellent response, I did not feel the need to repeat an MBB before her other RFA's  I let her know that you would need to do the medial branch block again before being able to do the RFA and she understands.   She plans on contacting your office to schedule.  Please let me know if you have any questions

## 2015-12-23 ENCOUNTER — Ambulatory Visit: Payer: BLUE CROSS/BLUE SHIELD

## 2015-12-23 DIAGNOSIS — R269 Unspecified abnormalities of gait and mobility: Secondary | ICD-10-CM

## 2015-12-23 DIAGNOSIS — R42 Dizziness and giddiness: Secondary | ICD-10-CM

## 2015-12-23 DIAGNOSIS — R2681 Unsteadiness on feet: Secondary | ICD-10-CM

## 2015-12-23 NOTE — Therapy (Signed)
[facility] [address] [place], [place], [address] Phone: [phone]   Fax:  [phone]  Physical Therapy Treatment  Patient Details  Name: Jocelyn Wilson MRN: [id number] Date of Birth: 20-Aug-1951 Referring Provider: Dr. Felecia Shelling  Encounter Date: 12/23/2015      PT End of Session - 12/23/15 1630    Visit Number 6   Number of Visits 17   Date for PT Re-Evaluation 01/20/16   Authorization Type BCBS   PT Start Time 1532   PT Stop Time 1612   PT Time Calculation (min) 40 min   Equipment Utilized During Treatment Gait belt   Activity Tolerance Patient tolerated treatment well   Behavior During Therapy [facility] for tasks assessed/performed      Past Medical History  Diagnosis Date  . GERD (gastroesophageal reflux disease)   . Diverticula of colon   . Multiple sclerosis ([place])   . Obesity   . Allergy   . Hyperlipidemia   . Anxiety   . Chronic kidney disease     KIDNEY STONES WITH STENTS  . Eczema   . Thyroid disease     hyperthyroidism  . Restless leg syndrome   . Hypercholesterolemia   . Depression   . Migraine headache   . Kidney stones   . PONV (postoperative nausea and vomiting)   . Palpitations   . Hypothyroidism   . Snoring   . Vertigo   . Unsteady gait   . Vision abnormalities   . Right sided sciatica 10/29/2015    Past Surgical History  Procedure Laterality Date  . Cholecystectomy    . Lithotripsy    . Nissen fundoplication    . Finger surgery  x8  . Breast surgery      AUGMENTATION  . Hernia repair    . Carpel tunnel    . Dilation and curettage of uterus  1983  . Ventral hernia repair  07/25/2012    Procedure: LAPAROSCOPIC VENTRAL HERNIA;  Surgeon: Adin Hector, MD;  Location: [facility];  Service: General;  Laterality: N/A;    There were no vitals filed for this visit.  Visit Diagnosis:  Abnormality of gait  Dizziness and giddiness  Unsteadiness      Subjective Assessment -  12/23/15 1535    Subjective Pt reported she tried sleeping on her side with body pillow vs. her stomach but did not sleep well the first night but slept better the second night. Pt reported she is scheduled for a procedure to reduce back pain and reports no precautions for PT. If the first procedure does not work, then she will have the ablation procedure.    Pertinent History Hypothyroidism, LBP (2 ablation surgeries and might have a third in the next few months), MS, DM   Patient Stated Goals Not be so wobbly   Currently in Pain? Yes   Pain Score 6    Pain Location Back   Pain Orientation Lower   Pain Descriptors / Indicators Aching  with some sharpness   Pain Type Chronic pain   Pain Onset More than a month ago   Pain Frequency Constant   Aggravating Factors  standing, laundry, stairs   Pain Relieving Factors rest          Neuro re-ed: Pt reviewed and performed feet apart balance HEP, seated gaze stab. HEP. Pt also performed progressed HEP. Please see pt instructions for details. Cues for technique and S for safety. Pt performed in corner with chair  in front of pt for safety. All exercises performed 2-3 reps.  Pt reported dizziness incr. From 0/10 to 1-2/10 during gaze HEP, but was tolerable.                        PT Education - 12/23/15 1624    Education provided Yes   Education Details Pt reviewed and performed HEP and PT progressed balance/gaze stab. HEP as tolerated.    Person(s) Educated Patient   Methods Explanation;Verbal cues;Handout   Comprehension Verbalized understanding;Returned demonstration;Need further instruction          PT Short Term Goals - 12/23/15 1632    PT SHORT TERM GOAL #1   Title Pt will be IND in HEP to improve dizziness, strength and balance. Target date: 12/18/15   Status Achieved   PT SHORT TERM GOAL #2   Title Pt will improve DGI to >/=15/24 to decr. falls risk. Target date: 12/18/15   Status Achieved   PT SHORT TERM GOAL #3    Title Pt will amb. 300' over even terrain at MOD I level to improve functional mobility. Target date: 12/18/15   Status Not Met   PT SHORT TERM GOAL #4   Title Pt will report dizziness will be </=5/10 at worst while performing head turns during amb. to improve safety during gait. Target date: 12/18/15   Status Achieved           PT Long Term Goals - 12/18/15 1629    PT LONG TERM GOAL #1   Title Pt will report no falls over the last 2 weeks of PT to improve safety.Target date: 01/15/16   Status On-going   PT LONG TERM GOAL #2   Title Pt will improve gait speed without AD to >/=2.51f/sec. to amb. safely in the community. Target date: 01/15/16   Status On-going   PT LONG TERM GOAL #3   Title Pt will improve DGI score to >/=20/24 to decr. falls risk.Target date: 01/15/16   Status On-going   PT LONG TERM GOAL #4   Title Pt will amb. 500' over even/uneven terrain at MOD I level to improve functional mobility. Target date: 01/15/16   Status On-going   PT LONG TERM GOAL #5   Title Pt will ascend/descend 8 steps with one handrail with one handrail, in step over pattern, MOD I to traverse stairs at home safely. Target date: 01/15/16   Status On-going   Additional Long Term Goals   Additional Long Term Goals Yes   PT LONG TERM GOAL #6   Title Pt will improve SOT score to WNL of age group (>/= 716 to improve balance. Target date: 01/15/16   Status On-going   PT LONG TERM GOAL #7   Title Pt will report </=2/10 dizziness during 300' of amb. while performing head turns in order to improve functional mobility. Target date; 01/15/16   Status New               Plan - 12/23/15 1631    Clinical Impression Statement Pt demonstrated progress as she met STG 1 and tolerated progression of balance and gaze stab. HEP. Pt required seated rest breaks during session today 2/2 LBP. Continue with POC.    Pt will benefit from skilled therapeutic intervention in order to improve on the following deficits Abnormal  gait;Decreased endurance;Pain;Postural dysfunction;Dizziness;Decreased mobility;Decreased balance;Decreased strength;Decreased knowledge of use of DME   Rehab Potential Good   Clinical Impairments Affecting Rehab Potential Hx of  MS, hx of LBP and pt might have ablation procedure to reduce pain, migraines, DM   PT Frequency 2x / week   PT Duration 8 weeks   PT Treatment/Interventions ADLs/Self Care Home Management;Biofeedback;Canalith Repostioning;Manual techniques;Wheelchair mobility training;Vestibular;Balance training;Therapeutic exercise;Therapeutic activities;Functional mobility training;Stair training;Gait training;Patient/family education;DME Instruction;Neuromuscular re-education;Energy conservation   PT Next Visit Plan High level balance activities and assess LBP, as it is contributing to antalgic gait and incr. postural sway.   PT Home Exercise Plan Gaze stab. and balance HEP.   Consulted and Agree with Plan of Care Patient        Problem List Patient Active Problem List   Diagnosis Date Noted  . Right sided sciatica 10/29/2015  . DM type 2 (diabetes mellitus, type 2) ([place]) 05/28/2015  . SVT (supraventricular tachycardia) ([place]) 01/07/2015  . Morbid obesity ([place]) 01/07/2015  . Hyperlipidemia 01/07/2015  . Multiple sclerosis ([place]) 11/28/2014  . Midline low back pain without sciatica 11/28/2014  . Lumbar radicular pain 11/28/2014  . Lumbosacral facet joint syndrome 11/28/2014  . Restless leg syndrome 11/28/2014  . Common migraine without aura 11/28/2014  . Incisional hernia with obstruction 06/15/2012  . Headache, migraine 01/03/2008  . Clinical depression 01/03/2008  . Anxiety state 01/03/2008    Miller,Jennifer L 12/23/2015, 4:39 PM  [facility] [address] [place], [place], [address] Phone: [phone]   Fax:  [phone]  Name: Jocelyn Wilson MRN: [id number] Date of Birth: 26-Jul-1951    Geoffry Paradise, PT,DPT 12/23/2015 4:39 PM Phone: [phone] Fax: [phone]

## 2015-12-23 NOTE — Patient Instructions (Addendum)
Gaze Stabilization: Tip Card  1.Target must remain in focus, not blurry, and appear stationary while head is in motion. 2.Perform exercises with small head movements (45 to either side of midline). 3.Increase speed of head motion so long as target is in focus. 4.If you wear eyeglasses, be sure you can see target through lens (therapist will give specific instructions for bifocal / progressive lenses). 5.These exercises may provoke dizziness or nausea. Work through these symptoms. If too dizzy, slow head movement slightly. Rest between each exercise. 6.Exercises demand concentration; avoid distractions. 7.For safety, perform standing exercises close to a counter, wall, corner, or next to someone. If dizziness increases to >5-6/10 stop, and either slow head motion and/or decrease range of head movements.  Copyright  VHI. All rights reserved.   Gaze Stabilization: Standing Feet Apart    Feet shoulder width apart, keeping eyes on target on wall __3-5__ feet away, tilt head down 15-30 and move head side to side for _20-30___ seconds. Repeat while moving head up and down for _20-30___ seconds. Do __2-3__ sessions per day.  Copyright  VHI. All rights reserved.   Perform in a corner with a chair in front of you for safety OR at kitchen sink with chair behind you:  Feet Together (Compliant Surface) Head Motion - Eyes Open    With eyes open, standing on compliant surface: ___pillow/cushion_____, feet together, move head slowly: up and down 10 times and side to side 10 times. Repeat __3__ times per session. Do __1__ sessions per day.  Copyright  VHI. All rights reserved.  Feet Together (Compliant Surface) Varied Arm Positions - Eyes Closed    Stand on compliant surface: ___pillow/cusion_____ with feet together and arms at your side. Close eyes and visualize upright position. Hold_10-20___ seconds. Repeat _3___ times per session. Do __1__ sessions per day.  Copyright  VHI. All  rights reserved.

## 2015-12-25 ENCOUNTER — Ambulatory Visit: Payer: BLUE CROSS/BLUE SHIELD

## 2015-12-25 DIAGNOSIS — R29898 Other symptoms and signs involving the musculoskeletal system: Secondary | ICD-10-CM

## 2015-12-25 DIAGNOSIS — R2681 Unsteadiness on feet: Secondary | ICD-10-CM

## 2015-12-25 DIAGNOSIS — R42 Dizziness and giddiness: Secondary | ICD-10-CM | POA: Diagnosis not present

## 2015-12-25 DIAGNOSIS — R269 Unspecified abnormalities of gait and mobility: Secondary | ICD-10-CM

## 2015-12-25 NOTE — Therapy (Signed)
[facility] [address] [place], [place], [address] Phone: [phone]   Fax:  [phone]  Physical Therapy Treatment  Patient Details  Name: Jocelyn Wilson MRN: [id number] Date of Birth: Apr 27, 1951 Referring Provider: Dr. Felecia Shelling  Encounter Date: 12/25/2015      PT End of Session - 12/25/15 1635    Visit Number 7   Number of Visits 17   Date for PT Re-Evaluation 01/20/16   Authorization Type BCBS   PT Start Time 1532   PT Stop Time 1613   PT Time Calculation (min) 41 min   Equipment Utilized During Treatment Gait belt   Activity Tolerance Patient tolerated treatment well   Behavior During Therapy [facility] for tasks assessed/performed      Past Medical History  Diagnosis Date  . GERD (gastroesophageal reflux disease)   . Diverticula of colon   . Multiple sclerosis ([place])   . Obesity   . Allergy   . Hyperlipidemia   . Anxiety   . Chronic kidney disease     KIDNEY STONES WITH STENTS  . Eczema   . Thyroid disease     hyperthyroidism  . Restless leg syndrome   . Hypercholesterolemia   . Depression   . Migraine headache   . Kidney stones   . PONV (postoperative nausea and vomiting)   . Palpitations   . Hypothyroidism   . Snoring   . Vertigo   . Unsteady gait   . Vision abnormalities   . Right sided sciatica 10/29/2015    Past Surgical History  Procedure Laterality Date  . Cholecystectomy    . Lithotripsy    . Nissen fundoplication    . Finger surgery  x8  . Breast surgery      AUGMENTATION  . Hernia repair    . Carpel tunnel    . Dilation and curettage of uterus  1983  . Ventral hernia repair  07/25/2012    Procedure: LAPAROSCOPIC VENTRAL HERNIA;  Surgeon: Adin Hector, MD;  Location: [facility];  Service: General;  Laterality: N/A;    There were no vitals filed for this visit.  Visit Diagnosis:  Abnormality of gait  Unsteadiness  Weakness of both lower extremities      Subjective  Assessment - 12/25/15 1535    Subjective Pt denied falls since last visit.  Pt reported she reduced Meclizine from 3 per day to 2/day. Pt reported back pain was worse after going to the grocery store.  Pt denied dizziness at rest or during session. Pt will miss PT next week 2/2 vacation to [place].   Pertinent History Hypothyroidism, LBP (2 ablation surgeries and might have a third in the next few months), MS, DM   Patient Stated Goals Not be so wobbly   Currently in Pain? Yes   Pain Score 1    Pain Location Back   Pain Orientation Lower   Pain Descriptors / Indicators Aching   Pain Type Chronic pain   Pain Onset More than a month ago   Pain Frequency Constant   Aggravating Factors  walking, grocery shopping   Pain Relieving Factors rest                         [facility] Adult PT Treatment/Exercise - 12/25/15 0001    High Level Balance   High Level Balance Activities Side stepping;Backward walking;Head turns;Marching forwards   High Level Balance Comments Performed in // bars with S-min  A for safety, with cues for technique. 4x7'/activity. Pt reported LBP incr. slightly after balance exercises form 1/10 to 2/10.  No reports of dizziness.    Manual Therapy   Manual Therapy Joint mobilization;Soft tissue mobilization   Manual therapy comments Pt in prone position and reported no incr. in pain during or after manual therapy. Pt reported she felt much more relaxed and her back did not "catch when transferring from prone to sitting position.   Joint Mobilization PAs and UPAs (R side) to L3-S1 segments, Grade 1 and 2 jt. mobs 3 bouts per segment for 30-45sec.    Soft tissue mobilization PT performed massage to erector spinae musculature.                PT Education - 12/25/15 1633    Education provided Yes   Education Details PT educated pt on using heat for ~40mnutes prior to activity and ice after activity for 10-164mutes, to decr. LBP in attempt to improve balance  and antalgic gait pattern. PT reiterated the importance of using 3-5 layers between ice/heat and skin.    Person(s) Educated Patient   Methods Explanation   Comprehension Verbalized understanding          PT Short Term Goals - 12/23/15 1632    PT SHORT TERM GOAL #1   Title Pt will be IND in HEP to improve dizziness, strength and balance. Target date: 12/18/15   Status Achieved   PT SHORT TERM GOAL #2   Title Pt will improve DGI to >/=15/24 to decr. falls risk. Target date: 12/18/15   Status Achieved   PT SHORT TERM GOAL #3   Title Pt will amb. 300' over even terrain at MOD I level to improve functional mobility. Target date: 12/18/15   Status Not Met   PT SHORT TERM GOAL #4   Title Pt will report dizziness will be </=5/10 at worst while performing head turns during amb. to improve safety during gait. Target date: 12/18/15   Status Achieved           PT Long Term Goals - 12/18/15 1629    PT LONG TERM GOAL #1   Title Pt will report no falls over the last 2 weeks of PT to improve safety.Target date: 01/15/16   Status On-going   PT LONG TERM GOAL #2   Title Pt will improve gait speed without AD to >/=2.62101fec. to amb. safely in the community. Target date: 01/15/16   Status On-going   PT LONG TERM GOAL #3   Title Pt will improve DGI score to >/=20/24 to decr. falls risk.Target date: 01/15/16   Status On-going   PT LONG TERM GOAL #4   Title Pt will amb. 500' over even/uneven terrain at MOD I level to improve functional mobility. Target date: 01/15/16   Status On-going   PT LONG TERM GOAL #5   Title Pt will ascend/descend 8 steps with one handrail with one handrail, in step over pattern, MOD I to traverse stairs at home safely. Target date: 01/15/16   Status On-going   Additional Long Term Goals   Additional Long Term Goals Yes   PT LONG TERM GOAL #6   Title Pt will improve SOT score to WNL of age group (>/= 70)52o improve balance. Target date: 01/15/16   Status On-going   PT LONG TERM GOAL  #7   Title Pt will report </=2/10 dizziness during 300' of amb. while performing head turns in order to improve functional mobility.  Target date; 01/15/16   Status New               Plan - 12/25/15 1635    Clinical Impression Statement Pt demonstrated progress, as she reported decr. LBP after manual therapy and was able to amb. without SPC as balance and antalgic gait pattern was improved. Pt continues to be limited by LBP and experiences incr. LOB and postural sway during high level balance activities. Continue with POC.    Pt will benefit from skilled therapeutic intervention in order to improve on the following deficits Abnormal gait;Decreased endurance;Pain;Postural dysfunction;Dizziness;Decreased mobility;Decreased balance;Decreased strength;Decreased knowledge of use of DME   Rehab Potential Good   Clinical Impairments Affecting Rehab Potential Hx of MS, hx of LBP and pt might have ablation procedure to reduce pain, migraines, DM   PT Frequency 2x / week   PT Duration 8 weeks   PT Treatment/Interventions ADLs/Self Care Home Management;Biofeedback;Canalith Repostioning;Manual techniques;Wheelchair mobility training;Vestibular;Balance training;Therapeutic exercise;Therapeutic activities;Functional mobility training;Stair training;Gait training;Patient/family education;DME Instruction;Neuromuscular re-education;Energy conservation   PT Next Visit Plan High level balance activities and continue manual therapy for LBP, as it is contributing to antalgic gait and incr. postural sway.   PT Home Exercise Plan Gaze stab. and balance HEP.   Consulted and Agree with Plan of Care Patient        Problem List Patient Active Problem List   Diagnosis Date Noted  . Right sided sciatica 10/29/2015  . DM type 2 (diabetes mellitus, type 2) ([place]) 05/28/2015  . SVT (supraventricular tachycardia) ([place]) 01/07/2015  . Morbid obesity ([place]) 01/07/2015  . Hyperlipidemia 01/07/2015  . Multiple sclerosis  ([place]) 11/28/2014  . Midline low back pain without sciatica 11/28/2014  . Lumbar radicular pain 11/28/2014  . Lumbosacral facet joint syndrome 11/28/2014  . Restless leg syndrome 11/28/2014  . Common migraine without aura 11/28/2014  . Incisional hernia with obstruction 06/15/2012  . Headache, migraine 01/03/2008  . Clinical depression 01/03/2008  . Anxiety state 01/03/2008    Miller,Jennifer L 12/25/2015, 4:38 PM  [facility] [address] [place], [place], [address] Phone: [phone]   Fax:  [phone]  Name: Yessika Otte MRN: [id number] Date of Birth: 12/27/1950    Geoffry Paradise, PT,DPT 12/25/2015 4:38 PM Phone: [phone] Fax: [phone]

## 2016-01-06 ENCOUNTER — Ambulatory Visit: Payer: BLUE CROSS/BLUE SHIELD

## 2016-01-06 NOTE — Therapy (Signed)
[facility] [address] [place], [place], [address] Phone: [phone]   Fax:  [phone]  Physical Therapy Treatment  Patient Details  Name: Jocelyn Wilson MRN: [id number] Date of Birth: 1950/11/14 Referring Provider: Dr. Felecia Shelling  Encounter Date: 01/06/2016  NO CHARGE FOR VISIT.      PT End of Session - 01/06/16 1558    Visit Number 7   Number of Visits 17   Date for PT Re-Evaluation 01/20/16   Authorization Type BCBS   PT Start Time 1531  no charge   PT Stop Time 1544   PT Time Calculation (min) 13 min      Past Medical History  Diagnosis Date  . GERD (gastroesophageal reflux disease)   . Diverticula of colon   . Multiple sclerosis ([place])   . Obesity   . Allergy   . Hyperlipidemia   . Anxiety   . Chronic kidney disease     KIDNEY STONES WITH STENTS  . Eczema   . Thyroid disease     hyperthyroidism  . Restless leg syndrome   . Hypercholesterolemia   . Depression   . Migraine headache   . Kidney stones   . PONV (postoperative nausea and vomiting)   . Palpitations   . Hypothyroidism   . Snoring   . Vertigo   . Unsteady gait   . Vision abnormalities   . Right sided sciatica 10/29/2015    Past Surgical History  Procedure Laterality Date  . Cholecystectomy    . Lithotripsy    . Nissen fundoplication    . Finger surgery  x8  . Breast surgery      AUGMENTATION  . Hernia repair    . Carpel tunnel    . Dilation and curettage of uterus  1983  . Ventral hernia repair  07/25/2012    Procedure: LAPAROSCOPIC VENTRAL HERNIA;  Surgeon: Adin Hector, MD;  Location: [facility];  Service: General;  Laterality: N/A;    There were no vitals filed for this visit.  Visit Diagnosis:  Abnormality of gait      Subjective Assessment - 01/06/16 1556    Subjective Pt reported she fell down 3 steps while in [place] last week (12/31/15). Pt reported she went to urgent care and had an x-ray and was told they  didn't think anything was broken in R foot but could not be sure due to edema. Pt has been wearing an ace bandage and ortho shoe due to R foot pain while standing/walking. Pt states she does not feel as though it is getting better. Pt stated she feels pain in the ball of her foot and describes it as burning and sharp pain. Pt would like to see an orthopedic MD prior to resuming PT.   Patient is accompained by: Family member  Peter-husband   Pertinent History Hypothyroidism, LBP (2 ablation surgeries and might have a third in the next few months), MS, DM   Patient Stated Goals Not be so wobbly                                 PT Education - 01/06/16 1558    Education provided Yes   Education Details PT educated pt on elevating R foot and using ice pack for 15 minutes to reduce pain and swelling.          PT Short Term Goals - 12/23/15 0630  PT SHORT TERM GOAL #1   Title Pt will be IND in HEP to improve dizziness, strength and balance. Target date: 12/18/15   Status Achieved   PT SHORT TERM GOAL #2   Title Pt will improve DGI to >/=15/24 to decr. falls risk. Target date: 12/18/15   Status Achieved   PT SHORT TERM GOAL #3   Title Pt will amb. 300' over even terrain at MOD I level to improve functional mobility. Target date: 12/18/15   Status Not Met   PT SHORT TERM GOAL #4   Title Pt will report dizziness will be </=5/10 at worst while performing head turns during amb. to improve safety during gait. Target date: 12/18/15   Status Achieved           PT Long Term Goals - 12/18/15 1629    PT LONG TERM GOAL #1   Title Pt will report no falls over the last 2 weeks of PT to improve safety.Target date: 01/15/16   Status On-going   PT LONG TERM GOAL #2   Title Pt will improve gait speed without AD to >/=2.6f/sec. to amb. safely in the community. Target date: 01/15/16   Status On-going   PT LONG TERM GOAL #3   Title Pt will improve DGI score to >/=20/24 to decr. falls  risk.Target date: 01/15/16   Status On-going   PT LONG TERM GOAL #4   Title Pt will amb. 500' over even/uneven terrain at MOD I level to improve functional mobility. Target date: 01/15/16   Status On-going   PT LONG TERM GOAL #5   Title Pt will ascend/descend 8 steps with one handrail with one handrail, in step over pattern, MOD I to traverse stairs at home safely. Target date: 01/15/16   Status On-going   Additional Long Term Goals   Additional Long Term Goals Yes   PT LONG TERM GOAL #6   Title Pt will improve SOT score to WNL of age group (>/= 775 to improve balance. Target date: 01/15/16   Status On-going   PT LONG TERM GOAL #7   Title Pt will report </=2/10 dizziness during 300' of amb. while performing head turns in order to improve functional mobility. Target date; 01/15/16   Status New               Plan - 01/06/16 1559    Clinical Impression Statement No charge for visit, as pt will see orthopedic MD to assess pt's R foot for fractures, sprains/strains prior to resuming PT.    Pt will benefit from skilled therapeutic intervention in order to improve on the following deficits Abnormal gait;Decreased endurance;Pain;Postural dysfunction;Dizziness;Decreased mobility;Decreased balance;Decreased strength;Decreased knowledge of use of DME   Rehab Potential Good   Clinical Impairments Affecting Rehab Potential Hx of MS, hx of LBP and pt might have ablation procedure to reduce pain, migraines, DM   PT Frequency 2x / week   PT Duration 8 weeks   PT Treatment/Interventions ADLs/Self Care Home Management;Biofeedback;Canalith Repostioning;Manual techniques;Wheelchair mobility training;Vestibular;Balance training;Therapeutic exercise;Therapeutic activities;Functional mobility training;Stair training;Gait training;Patient/family education;DME Instruction;Neuromuscular re-education;Energy conservation   PT Next Visit Plan High level balance activities and continue manual therapy for LBP, as it is  contributing to antalgic gait and incr. postural sway.   PT Home Exercise Plan Gaze stab. and balance HEP.   Consulted and Agree with Plan of Care Patient        Problem List Patient Active Problem List   Diagnosis Date Noted  . Right sided sciatica 10/29/2015  .  DM type 2 (diabetes mellitus, type 2) ([facility]) 05/28/2015  . SVT (supraventricular tachycardia) ([facility]) 01/07/2015  . Morbid obesity ([place]) 01/07/2015  . Hyperlipidemia 01/07/2015  . Multiple sclerosis ([place]) 11/28/2014  . Midline low back pain without sciatica 11/28/2014  . Lumbar radicular pain 11/28/2014  . Lumbosacral facet joint syndrome 11/28/2014  . Restless leg syndrome 11/28/2014  . Common migraine without aura 11/28/2014  . Incisional hernia with obstruction 06/15/2012  . Headache, migraine 01/03/2008  . Clinical depression 01/03/2008  . Anxiety state 01/03/2008    , L 01/06/2016, 4:00 PM  [facility] [address] [place], [place], [address] Phone: [phone]   Fax:  [phone]  Name: Cathaleen Korol MRN: [id number] Date of Birth: 1951-10-05    Geoffry Paradise, PT,DPT 01/06/2016 4:00 PM Phone: [phone] Fax: [phone]

## 2016-01-07 ENCOUNTER — Ambulatory Visit
Admission: RE | Admit: 2016-01-07 | Discharge: 2016-01-07 | Disposition: A | Discharge status: Home / Self Care | Payer: BLUE CROSS/BLUE SHIELD | Source: Ambulatory Visit | Attending: Neurology | Admitting: Neurology

## 2016-01-07 ENCOUNTER — Other Ambulatory Visit: Payer: Self-pay | Admitting: Neurology

## 2016-01-07 DIAGNOSIS — M5416 Radiculopathy, lumbar region: Secondary | ICD-10-CM

## 2016-01-07 MED ORDER — IOHEXOL 180 MG/ML  SOLN: 1.0000 mL | Freq: Once | INTRAMUSCULAR | Status: DC | PRN

## 2016-01-07 NOTE — Discharge Instructions (Signed)

## 2016-01-09 ENCOUNTER — Other Ambulatory Visit: Payer: Self-pay | Admitting: Neurology

## 2016-01-09 DIAGNOSIS — M5416 Radiculopathy, lumbar region: Secondary | ICD-10-CM

## 2016-01-13 ENCOUNTER — Encounter: Payer: BLUE CROSS/BLUE SHIELD | Admitting: Physical Therapy

## 2016-01-14 ENCOUNTER — Other Ambulatory Visit: Payer: BLUE CROSS/BLUE SHIELD

## 2016-01-16 ENCOUNTER — Inpatient Hospital Stay: Admission: RE | Admit: 2016-01-16 | Payer: BLUE CROSS/BLUE SHIELD | Source: Ambulatory Visit

## 2016-01-19 ENCOUNTER — Telehealth: Payer: Self-pay

## 2016-01-19 NOTE — Telephone Encounter (Signed)
PT called pt to discuss scheduling additional appt's, once foot fracture has healed. No answer, PT left msg.

## 2016-01-23 ENCOUNTER — Other Ambulatory Visit: Payer: Self-pay | Admitting: Neurology

## 2016-01-27 ENCOUNTER — Other Ambulatory Visit: Payer: Self-pay | Admitting: Interventional Cardiology

## 2016-02-03 ENCOUNTER — Telehealth: Payer: Self-pay | Admitting: Neurology

## 2016-02-03 NOTE — Telephone Encounter (Signed)
Jocelyn Wilson/GI [phone] called sts RFA was denied by insurance. Dr Felecia Shelling to please write a detailed letter, as much detail as he can put in it. Denial sts she needs at least 3 mths of conservative treatment and she hasn't had enough PT. Anderson Malta will fax denial to [facility] office

## 2016-02-03 NOTE — Telephone Encounter (Signed)
Ins. denial given to Dr. Ciro Backer will write appeal letter/fim

## 2016-02-04 ENCOUNTER — Other Ambulatory Visit: Payer: Self-pay | Admitting: Neurology

## 2016-02-12 ENCOUNTER — Other Ambulatory Visit: Payer: Self-pay | Admitting: Neurology

## 2016-02-13 ENCOUNTER — Ambulatory Visit (INDEPENDENT_AMBULATORY_CARE_PROVIDER_SITE_OTHER): Payer: BLUE CROSS/BLUE SHIELD | Admitting: Neurology

## 2016-02-13 ENCOUNTER — Encounter: Payer: Self-pay | Admitting: Neurology

## 2016-02-13 VITALS — BP 126/78 | HR 66 | Resp 18 | Ht <= 58 in | Wt 227.5 lb

## 2016-02-13 DIAGNOSIS — G35 Multiple sclerosis: Secondary | ICD-10-CM | POA: Diagnosis not present

## 2016-02-13 DIAGNOSIS — M47817 Spondylosis without myelopathy or radiculopathy, lumbosacral region: Secondary | ICD-10-CM

## 2016-02-13 DIAGNOSIS — F329 Major depressive disorder, single episode, unspecified: Secondary | ICD-10-CM | POA: Diagnosis not present

## 2016-02-13 DIAGNOSIS — F411 Generalized anxiety disorder: Secondary | ICD-10-CM | POA: Diagnosis not present

## 2016-02-13 DIAGNOSIS — M545 Low back pain, unspecified: Secondary | ICD-10-CM

## 2016-02-13 DIAGNOSIS — G43009 Migraine without aura, not intractable, without status migrainosus: Secondary | ICD-10-CM

## 2016-02-13 DIAGNOSIS — F32A Depression, unspecified: Secondary | ICD-10-CM

## 2016-02-13 MED ORDER — TRAMADOL HCL 50 MG PO TABS: 50.0000 mg | ORAL_TABLET | Freq: Three times a day (TID) | ORAL | Status: DC | PRN

## 2016-02-13 MED ORDER — METOPROLOL SUCCINATE ER 50 MG PO TB24: 50.0000 mg | ORAL_TABLET | Freq: Every day | ORAL | Status: DC

## 2016-02-13 NOTE — Progress Notes (Signed)
[facility]  PATIENT: Jocelyn Wilson DOB: December 27, 1950  REFERRING DOCTOR OR PCP:  Adaku Nnodi SOURCE: Patient  _________________________________   HISTORICAL  CHIEF COMPLAINT:  Chief Complaint  Patient presents with  . Multiple Sclerosis    Sts. she continues to tolerate Betaseron well.  Needs r/f of Toprol (RAS origianally gave for tremors, but cardiology has also r/f Toprol for PVC's. She continues to c/o worsening lbp.  Has been referred to [facility] for RFA, but insurance has denied it.  She would like to discuss other tx. options/fim    HISTORY OF PRESENT ILLNESS:  Jocelyn Wilson is a 65 year old woman with multiple sclerosis, who has had quite a bit more lower back pain over the last year,  and migraines.      Vertigo:   She is noting more dizziness.   In the past, she felt better with meclizine.   When she went down to one pill a day.   Vertigo s worse with motions (long car drive) or when looking up.  However, there eappears to be a low level of vertigo throughout the day.    She was unable to tolerate valium in the past.   LBP:    Her LBP is worse the past 6 months and insurance is putting up barriers for RFA of the medial branch nerves which greatly helped her in the past.    Her quality of life has sufferred waiting for insurance issues.  Pain is most intense in the lower back and right buttock, without radiation of pain in the leg  Pain is more severe than last visit.     MRI of the lumbar spine dated 11/12/2015 shows severe degenerative disc disease and facet hypertrophy at L4-L5 and DJD at L5S1. She has had several pain management injection procedures.  She has had radiofrequency ablations of the medial branch nerves with benefit. She also has had ESI's, SI joint injections, piriformis muscles injections with less benefit.   She had RFA of the medial branch nerves 08/2014 with some benefit and got much more benefit.   She had already done PT without  much benefit.  2015 into 2016.    Hydrocodone makes her very sleepy.   Tramadol did not help as much but she tolerated it better  MS:   The last MRI of the brain was performed on 11/12/15 and was stable compared to prior MRI.  Images are abnormal showing periventricular white matter foci consistent with multiple sclerosis.     She is on Betaseron.    She has not had any definite exacerbation.   She tolerates it well.      Gait/strength/sensation:  She has more difficulty with her balance and gait is worse, probably due more to her pain than her MS.      She cannot balance well enough to carry items when she walks.    No significant problem with strength or numbness.     She feels some dizziness when staniing and feels she needs to get her balance before she is comfortable to walk.  Bladder:   She has some urinary frequency. She denies any hesitancy.   She has not had urinary tract infections.   Vision:   She wears glasses.    She had right ON in the past.   She has had dry eyes.  Her right eye is sometimes blurry, more likely in the evening  Fatigue/sleep:   She has fatigue despite sleeping better at  night with trazodone. Provigil has helped her fatigue some.     Mood/cognition:   She has a chronic mild depression and often feels sad with crying. Depression has worsened significantly this year and she has frequent spells of crying now. She also has anxiety. She takes sertraline, BuSpar and alprazolam.   She notes some cognitive issues.   She has more difficulty navigating and has gotten lost.   Mild short term memory issues and difficulty multi-tasking.     Migraine:  She has migraine headaches, often triggered by changes in weather.   With most part, migraines have been milder the last couple of years than they had been in the past.     When HA is more severe (often when she wakes up with one) she takes NSAID's.    When severe, she has throbbing pain with photophobia and mild nausea. Imitrex caused a  lot of Nausea and she stopped.     Right foot pain:  She fractured her right foot earlier this year and saw [facility].    She has severe arthritis in her feet as well.   She has a lot of pain in that foot.     MS History:   Her MS was diagnosed around 2004.    She was actually seeing me for migraine headache when she presented with optic neuritis and vertigo and had another MRI.   She had interval development of multiple new foci and underwent a lumbar puncture.  CSF was also consistent with MS.   She was placed on Betaseron and continues on that medication with good control of the MS. No definite exacerbations.    REVIEW OF SYSTEMS: Constitutional: No fevers, chills, sweats, or change in .  She notes fatigue Eyes: No visual changes, double vision, eye pain Ear, nose and throat: No hearing loss, ear pain, nasal congestion, sore throat Cardiovascular: No chest pain, palpitations Respiratory: No shortness of breath at rest or with exertion.   No wheezes GastrointestinaI: No nausea, vomiting, diarrhea, abdominal pain, fecal incontinence Genitourinary: No dysuria, urinary retention or frequency.  No nocturia. Musculoskeletal:as aboveIntegumentary: No rash, pruritus, skin lesions Neurological: as above Psychiatric: Notes anxiety and  Depression, worsening Endocrine: No palpitations, diaphoresis, change in appetite, change in weigh or increased thirst Hematologic/Lymphatic: No anemia, purpura, petechiae. Allergic/Immunologic: No itchy/runny eyes, nasal congestion, recent allergic reactions, rashes  ALLERGIES: Allergies  Allergen Reactions  . Iron Anaphylaxis    Iv iron  . Ciprofloxacin     Stomach upset  . Contrast Media [Iodinated Diagnostic Agents] Itching    Rash and itching  . Sulfa Antibiotics     Rash and itching  . Tape Rash    Adhesive tape    HOME MEDICATIONS:  Current outpatient prescriptions:  .  ALPRAZolam (XANAX) 0.5 MG tablet, Take 0.5 mg by mouth., Disp: ,  Rfl:  .  atorvastatin (LIPITOR) 20 MG tablet, Take 20 mg by mouth daily., Disp: , Rfl:  .  busPIRone (BUSPAR) 30 MG tablet, TAKE 1 TABLET THREE TIMES A DAY, Disp: 270 tablet, Rfl: 3 .  EPINEPHrine (EPIPEN) 0.3 mg/0.3 mL DEVI, Inject 0.3 mg into the muscle as needed. , Disp: , Rfl:  .  Ergocalciferol (VITAMIN D2) 2000 UNITS TABS, Take 2,000 Units by mouth daily. , Disp: , Rfl:  .  esomeprazole (NEXIUM) 40 MG capsule, Take 40 mg by mouth 2 (two) times daily. , Disp: , Rfl:  .  ferrous sulfate 325 (65 FE) MG tablet, Take 325 mg  by mouth 2 (two) times daily., Disp: , Rfl:  .  FLOVENT HFA 220 MCG/ACT inhaler, Inhale 1 puff into the lungs daily. , Disp: , Rfl: 1 .  Ibuprofen (ADVIL) 200 MG CAPS, Take 2 capsules by mouth 3 (three) times daily as needed. For migraines, Disp: , Rfl:  .  interferon beta-1b (BETASERON) 0.3 MG injection, Inject 0.25 mg into the skin every other day., Disp: , Rfl:  .  meclizine (ANTIVERT) 12.5 MG tablet, Take 1 tablet (12.5 mg total) by mouth 3 (three) times daily as needed for dizziness., Disp: 90 tablet, Rfl: 3 .  metoprolol succinate (TOPROL-XL) 50 MG 24 hr tablet, Take 1 tablet (50 mg total) by mouth daily., Disp: 90 tablet, Rfl: 0 .  potassium citrate (UROCIT-K) 10 MEQ (1080 MG) SR tablet, Take 10 mEq by mouth 3 (three) times daily with meals., Disp: , Rfl:  .  pyridOXINE (VITAMIN B-6) 100 MG tablet, Take 100 mg by mouth daily., Disp: , Rfl:  .  Pyridoxine HCl (VITAMIN B-6 PO), Take 1 tablet by mouth daily., Disp: , Rfl:  .  ranitidine (ZANTAC) 75 MG tablet, Take 75 mg by mouth at bedtime., Disp: , Rfl:  .  rOPINIRole (REQUIP) 1 MG tablet, TAKE 1 TABLET FOUR TIMES A DAY, Disp: 360 tablet, Rfl: 1 .  sertraline (ZOLOFT) 100 MG tablet, TAKE ONE AND ONE-HALF TABLETS DAILY, Disp: 135 tablet, Rfl: 0 .  traZODone (DESYREL) 100 MG tablet, Take 150 mg by mouth at bedtime., Disp: , Rfl:  .  glimepiride (AMARYL) 1 MG tablet, , Disp: , Rfl:  .  levothyroxine (SYNTHROID, LEVOTHROID)  100 MCG tablet, TK 1 T PO QD, Disp: , Rfl: 0 .  ONE TOUCH ULTRA TEST test strip, USE TO CHECK FASTING AND PRE-DINNER BLOOD GLUCOSE BID, Disp: , Rfl: 12 .  ONETOUCH DELICA LANCETS [id number] MISC, USE TO CHECK FASTING AND PRE-DINNER BLOOD GLUCOSE BID, Disp: , Rfl: 12  PAST MEDICAL HISTORY: Past Medical History  Diagnosis Date  . GERD (gastroesophageal reflux disease)   . Diverticula of colon   . Multiple sclerosis ([place])   . Obesity   . Allergy   . Hyperlipidemia   . Anxiety   . Chronic kidney disease     KIDNEY STONES WITH STENTS  . Eczema   . Thyroid disease     hyperthyroidism  . Restless leg syndrome   . Hypercholesterolemia   . Depression   . Migraine headache   . Kidney stones   . PONV (postoperative nausea and vomiting)   . Palpitations   . Hypothyroidism   . Snoring   . Vertigo   . Unsteady gait   . Vision abnormalities   . Right sided sciatica 10/29/2015    PAST SURGICAL HISTORY: Past Surgical History  Procedure Laterality Date  . Cholecystectomy    . Lithotripsy    . Nissen fundoplication    . Finger surgery  x8  . Breast surgery      AUGMENTATION  . Hernia repair    . Carpel tunnel    . Dilation and curettage of uterus  1983  . Ventral hernia repair  07/25/2012    Procedure: LAPAROSCOPIC VENTRAL HERNIA;  Surgeon: Adin Hector, MD;  Location: [facility];  Service: General;  Laterality: N/A;    FAMILY HISTORY: Family History  Problem Relation Age of Onset  . Hyperlipidemia Mother   . Glaucoma Mother   . Hypertension Mother   . Heart disease Father   . Cancer Father  prostate  . Diabetes Father   . Dementia Father   . Cancer Brother     esophageal  . Arthritis Sister     RA    SOCIAL HISTORY:  Social History   Social History  . Marital Status: Married    Spouse Name: N/A  . Number of Children: N/A  . Years of Education: N/A   Occupational History  . Not on file.   Social History Main Topics  . Smoking status: Former Research scientist (life sciences)  . Smokeless  tobacco: Never Used  . Alcohol Use: No  . Drug Use: No  . Sexual Activity: Not on file   Other Topics Concern  . Not on file   Social History Narrative     PHYSICAL EXAM  There were no vitals filed for this visit.  There is no weight on file to calculate BMI.   General: The patient is well-developed and well-nourished and in no acute distress  Musculoskeletal:  Lumbar paraspinal muscles are moderately -tender L4L5S1  Neurologic Exam  Mental status: The patient is alert and oriented x 3 at the time of the examination. The patient has apparent normal recent and remote memory, with an apparently normal attention span and concentration ability.   Speech is normal.  Cranial nerves: Extraocular movements are full. Pupils show a 1+ right APD. She has decreased color vision OS.   Visual fields are full.  Facial symmetry is present. There is good facial sensation to soft touch bilaterally.Facial strength is normal.  Trapezius and sternocleidomastoid strength is normal. No dysarthria is noted.  The tongue is midline, and the patient has symmetric elevation of the soft palate. No obvious hearing deficits are noted.  Motor:  Muscle bulk is normal.   Tone is normal. Strength is  5 / 5 in all 4 extremities.   Sensory: Sensory testing is intact to soft touch.   She reports decreased vibration sensation at the knees but normal at the toes.  Coordination: Cerebellar testing reveals good finger-nose-finger and heel-to-shin bilaterally.  Gait and station: Station is normal.   Gait is wide and tandem is wide.   Romberg is negative.   Reflexes: Deep tendon reflexes are symmetric and normal bilaterally.       DIAGNOSTIC DATA (LABS, IMAGING, TESTING) - I reviewed patient records, labs, notes, testing and imaging myself where available.    ASSESSMENT AND PLAN  Multiple sclerosis ([place])  Midline low back pain without sciatica  Lumbosacral facet joint syndrome  Migraine without aura and  without status migrainosus, not intractable  Anxiety state  Clinical depression    1.    I will write a letter up feeling the insurance decision not to cover radiofrequency ablation. This procedure has helped her tremendously in the past and is medically necessary. 2.   Meclizine for vertigo 3.  Trial of tramadol to help pain. She was unable to tolerate hydrocodone in the past. 4.   Mood is much worse with her experiencing more pain and we discussed continuation of her medications for this.  5.   She will return to see me in 4 months or sooner if she has new or worsening neurologic symptoms.  45 minutes face-to-face evaluation with greater than one half of the time counseling and coordinating care about her lower back pain, facet syndrome, insurance issues, etc.  [place] Alarie A. Felecia Shelling, MD, PhD [id number], [id number] AM Certified in Neurology, Clinical Neurophysiology, Sleep Medicine, Pain Medicine and Neuroimaging  [facility] [address], [address]  [place], [place] [address] [phone]

## 2016-02-16 ENCOUNTER — Encounter: Payer: Self-pay | Admitting: Neurology

## 2016-02-16 ENCOUNTER — Telehealth: Payer: Self-pay | Admitting: Neurology

## 2016-02-16 DIAGNOSIS — M545 Low back pain, unspecified: Secondary | ICD-10-CM

## 2016-02-16 DIAGNOSIS — M47817 Spondylosis without myelopathy or radiculopathy, lumbosacral region: Secondary | ICD-10-CM

## 2016-02-16 NOTE — Telephone Encounter (Signed)
Pt called in stating Dr. Paula Libra from [facility] can not schedule a Medial Block with out a work order.  Please call and advise [phone]

## 2016-02-16 NOTE — Telephone Encounter (Signed)
Per RAS ok, order for MBB L4-5 and L5-S1 placed in EPIC.  To be done at [facility].  LMOM (identified vm) letting Jocelyn Wilson know this was being done.  She does not need to return this call unless she has questions/fim

## 2016-02-16 NOTE — Telephone Encounter (Signed)
noted/fim 

## 2016-02-16 NOTE — Telephone Encounter (Signed)
Patient returned Jocelyn Wilson's call, states she is having trouble getting her messages from her phone. Patient advised of Jocelyn Wilson's message below.

## 2016-02-17 ENCOUNTER — Other Ambulatory Visit: Payer: Self-pay | Admitting: Neurology

## 2016-02-17 DIAGNOSIS — G8929 Other chronic pain: Secondary | ICD-10-CM

## 2016-02-17 DIAGNOSIS — M545 Low back pain: Principal | ICD-10-CM

## 2016-02-18 ENCOUNTER — Telehealth: Payer: Self-pay | Admitting: Neurology

## 2016-02-18 NOTE — Telephone Encounter (Signed)
Answering service message: Elisha/ [employer] Message: would like to speak with Jocelyn Wilson about letter they received. Please call back [phone]

## 2016-02-19 NOTE — Telephone Encounter (Signed)
Elisha/BC [phone] returned Jocelyn Wilson's call

## 2016-02-19 NOTE — Telephone Encounter (Signed)
I have spoken with [name] at [facility].  They are reviewing appeal for Jocelyn Wilson's MBB.  She will contact [place], stating she needs more info from performing provider, not ordering provider/fim

## 2016-02-23 ENCOUNTER — Ambulatory Visit
Admission: RE | Admit: 2016-02-23 | Discharge: 2016-02-23 | Disposition: A | Discharge status: Home / Self Care | Payer: BLUE CROSS/BLUE SHIELD | Source: Ambulatory Visit | Attending: Neurology | Admitting: Neurology

## 2016-02-23 ENCOUNTER — Other Ambulatory Visit: Payer: Self-pay | Admitting: Neurology

## 2016-02-23 DIAGNOSIS — G8929 Other chronic pain: Secondary | ICD-10-CM

## 2016-02-23 DIAGNOSIS — M545 Low back pain, unspecified: Secondary | ICD-10-CM

## 2016-02-23 DIAGNOSIS — K219 Gastro-esophageal reflux disease without esophagitis: Secondary | ICD-10-CM | POA: Insufficient documentation

## 2016-02-23 DIAGNOSIS — D509 Iron deficiency anemia, unspecified: Secondary | ICD-10-CM | POA: Insufficient documentation

## 2016-02-23 DIAGNOSIS — F329 Major depressive disorder, single episode, unspecified: Secondary | ICD-10-CM | POA: Insufficient documentation

## 2016-02-23 DIAGNOSIS — E039 Hypothyroidism, unspecified: Secondary | ICD-10-CM | POA: Insufficient documentation

## 2016-02-23 DIAGNOSIS — J45909 Unspecified asthma, uncomplicated: Secondary | ICD-10-CM | POA: Insufficient documentation

## 2016-02-23 DIAGNOSIS — I10 Essential (primary) hypertension: Secondary | ICD-10-CM | POA: Insufficient documentation

## 2016-02-23 DIAGNOSIS — R2689 Other abnormalities of gait and mobility: Secondary | ICD-10-CM | POA: Insufficient documentation

## 2016-02-23 DIAGNOSIS — M8580 Other specified disorders of bone density and structure, unspecified site: Secondary | ICD-10-CM | POA: Insufficient documentation

## 2016-02-23 DIAGNOSIS — L309 Dermatitis, unspecified: Secondary | ICD-10-CM | POA: Insufficient documentation

## 2016-02-23 DIAGNOSIS — E119 Type 2 diabetes mellitus without complications: Secondary | ICD-10-CM | POA: Insufficient documentation

## 2016-02-23 MED ORDER — IOPAMIDOL (ISOVUE-M 200) INJECTION 41%: 1.0000 mL | Freq: Once | INTRAMUSCULAR | Status: DC

## 2016-02-23 NOTE — Discharge Instructions (Signed)

## 2016-02-24 NOTE — Therapy (Signed)
[facility] [address] [place], [place], [address] Phone: [phone]   Fax:  [phone]  Patient Details  Name: Jocelyn Wilson MRN: [id number] Date of Birth: September 25, 1951 Referring Provider:  No ref. provider found  Encounter Date: 02/24/2016  PHYSICAL THERAPY DISCHARGE SUMMARY  Visits from Start of Care: 7  Current functional level related to goals / functional outcomes:     PT Short Term Goals - 12/23/15 1632    PT SHORT TERM GOAL #1   Title Pt will be IND in HEP to improve dizziness, strength and balance. Target date: 12/18/15   Status Achieved   PT SHORT TERM GOAL #2   Title Pt will improve DGI to >/=15/24 to decr. falls risk. Target date: 12/18/15   Status Achieved   PT SHORT TERM GOAL #3   Title Pt will amb. 300' over even terrain at MOD I level to improve functional mobility. Target date: 12/18/15   Status Not Met   PT SHORT TERM GOAL #4   Title Pt will report dizziness will be </=5/10 at worst while performing head turns during amb. to improve safety during gait. Target date: 12/18/15   Status Achieved         PT Long Term Goals - 12/18/15 1629    PT LONG TERM GOAL #1   Title Pt will report no falls over the last 2 weeks of PT to improve safety.Target date: 01/15/16   Status On-going   PT LONG TERM GOAL #2   Title Pt will improve gait speed without AD to >/=2.30f/sec. to amb. safely in the community. Target date: 01/15/16   Status On-going   PT LONG TERM GOAL #3   Title Pt will improve DGI score to >/=20/24 to decr. falls risk.Target date: 01/15/16   Status On-going   PT LONG TERM GOAL #4   Title Pt will amb. 500' over even/uneven terrain at MOD I level to improve functional mobility. Target date: 01/15/16   Status On-going   PT LONG TERM GOAL #5   Title Pt will ascend/descend 8 steps with one handrail with one handrail, in step over pattern, MOD I to traverse stairs at home safely. Target date: 01/15/16   Status  On-going   Additional Long Term Goals   Additional Long Term Goals Yes   PT LONG TERM GOAL #6   Title Pt will improve SOT score to WNL of age group (>/= 753 to improve balance. Target date: 01/15/16   Status On-going   PT LONG TERM GOAL #7   Title Pt will report </=2/10 dizziness during 300' of amb. while performing head turns in order to improve functional mobility. Target date; 01/15/16   Status New        Remaining deficits: Unknown, as pt had to cease PT 2/2 falling and breaking her foot while on vacation, and pt non-weight bearing at this time.   Education / Equipment: HEP  Plan: Patient agrees to discharge.  Patient goals were partially met. Patient is being discharged due to a change in medical status.  ?????       Miller,Jennifer L 02/24/2016, 11:40 AM  [facility] [address]Phone: [phone]  Fax:  [phone]  JGeoffry Paradise PT,DPT 02/24/2016 11:40 AM Phone: [phone]: [phone]

## 2016-03-03 ENCOUNTER — Other Ambulatory Visit: Payer: Self-pay | Admitting: Neurology

## 2016-03-16 ENCOUNTER — Ambulatory Visit (INDEPENDENT_AMBULATORY_CARE_PROVIDER_SITE_OTHER): Payer: BLUE CROSS/BLUE SHIELD | Admitting: Neurology

## 2016-03-16 ENCOUNTER — Encounter: Payer: Self-pay | Admitting: Neurology

## 2016-03-16 ENCOUNTER — Telehealth: Payer: Self-pay | Admitting: Neurology

## 2016-03-16 VITALS — BP 140/80 | HR 88 | Resp 22 | Ht 59.0 in | Wt 227.0 lb

## 2016-03-16 DIAGNOSIS — M47899 Other spondylosis, site unspecified: Secondary | ICD-10-CM | POA: Diagnosis not present

## 2016-03-16 DIAGNOSIS — M5432 Sciatica, left side: Secondary | ICD-10-CM | POA: Diagnosis not present

## 2016-03-16 DIAGNOSIS — G35 Multiple sclerosis: Secondary | ICD-10-CM

## 2016-03-16 DIAGNOSIS — M47819 Spondylosis without myelopathy or radiculopathy, site unspecified: Principal | ICD-10-CM

## 2016-03-16 DIAGNOSIS — M5431 Sciatica, right side: Secondary | ICD-10-CM | POA: Insufficient documentation

## 2016-03-16 DIAGNOSIS — E041 Nontoxic single thyroid nodule: Secondary | ICD-10-CM | POA: Insufficient documentation

## 2016-03-16 DIAGNOSIS — E669 Obesity, unspecified: Secondary | ICD-10-CM | POA: Insufficient documentation

## 2016-03-16 DIAGNOSIS — F411 Generalized anxiety disorder: Secondary | ICD-10-CM | POA: Insufficient documentation

## 2016-03-16 NOTE — Progress Notes (Signed)
[facility]  PATIENT: Jocelyn Wilson DOB: Feb 03, 1951  REFERRING DOCTOR OR PCP:  Adaku Nnodi SOURCE: Patient  _________________________________   HISTORICAL  CHIEF COMPLAINT:  Chief Complaint  Patient presents with  . Multiple Sclerosis    Today she c/o increased lower back pain, radiating into left posterior thigh.  She would like a tpi today if appropriate/fim  . Back Pain    HISTORY OF PRESENT ILLNESS:  Jocelyn Wilson is a 65 year old woman with multiple sclerosis, back pain sciatica, and migraines.  Her LBP is worse the last few months.  Pain is most intense in the right buttock, and across the lower back.   Pain sometimes will radiate to the proximal leg but never down towards the foot. the leg    MRI of the lumbar spine  shows severe degenerative disc disease and facet hypertrophy at L4-L5. She has had several pain management injection procedures.  She has had radiofrequency ablations of the medial branch nerves with benefit. She also has had ESI's, SI joint injections, piriformis muscles injections with less benefit.   She had RFA of the medial branch nerves 08/2014 with some benefit and got much more benefit after also doing PT 11/15 - 1/16.   MS:   Her MS has been stable. She denies any recent exacerbations. She continues on Betaseron without difficulty. MRI 2017 has shown periventricular white matter foci consistent with multiple sclerosis. When compared to prior MRI dated 08/25/2009, there were no new lesions. The older scan also showed a small left medullary focus that was not observed on the more recent study.      She is on Betaseron.   She denies any gait, strength, sensation. She denies any significant vision or bladder problems.  He notes some fatigue is sleeping okay most nights. Provigil has helped her fatigue.  She has a chronic mild depression and often feels sad with occasional crying. She takes sertraline, BuSpar and alprazolam.   She notes  some cognitive issues.   She has more difficulty navigating and has gotten lost.   Mild short term memory issues and difficulty multi-tasking.     MS History:   Her MS was diagnosed around 2004.    She was actually seeing me for migraine headache when she presented with optic neuritis and vertigo and had another MRI.   She had interval development of multiple new foci and underwent a lumbar puncture.  CSF was also consistent with MS.   She was placed on Betaseron and continues on that medication with good control of the MS. No definite exacerbations.    REVIEW OF SYSTEMS: Constitutional: No fevers, chills, sweats, or change in appetite Eyes: No visual changes, double vision, eye pain Ear, nose and throat: No hearing loss, ear pain, nasal congestion, sore throat Cardiovascular: No chest pain, palpitations Respiratory: No shortness of breath at rest or with exertion.   No wheezes GastrointestinaI: No nausea, vomiting, diarrhea, abdominal pain, fecal incontinence Genitourinary: No dysuria, urinary retention or frequency.  No nocturia. Musculoskeletal: No neck pain, back pain Integumentary: No rash, pruritus, skin lesions Neurological: as above Psychiatric: No depression at this time.  No anxiety Endocrine: No palpitations, diaphoresis, change in appetite, change in weigh or increased thirst Hematologic/Lymphatic: No anemia, purpura, petechiae. Allergic/Immunologic: No itchy/runny eyes, nasal congestion, recent allergic reactions, rashes  ALLERGIES: Allergies  Allergen Reactions  . Iron Anaphylaxis    Iv iron  . Ciprofloxacin     Stomach upset  . Contrast Media [Iodinated  Diagnostic Agents] Itching    Rash and itching  . Sulfa Antibiotics     Rash and itching  . Tape Rash    Adhesive tape    HOME MEDICATIONS:  Current outpatient prescriptions:  .  ALPRAZolam (XANAX) 0.5 MG tablet, Take 0.5 mg by mouth., Disp: , Rfl:  .  atorvastatin (LIPITOR) 20 MG tablet, Take 20 mg by mouth  daily., Disp: , Rfl:  .  busPIRone (BUSPAR) 30 MG tablet, , Disp: , Rfl:  .  Cholecalciferol (VITAMIN D) 2000 units tablet, , Disp: , Rfl:  .  EPINEPHrine (EPIPEN) 0.3 mg/0.3 mL DEVI, Inject 0.3 mg into the muscle as needed. , Disp: , Rfl:  .  Ergocalciferol (VITAMIN D2) 2000 UNITS TABS, Take 2,000 Units by mouth daily. , Disp: , Rfl:  .  esomeprazole (NEXIUM) 40 MG capsule, Take 40 mg by mouth 2 (two) times daily. , Disp: , Rfl:  .  ferrous sulfate 325 (65 FE) MG tablet, Take 325 mg by mouth 2 (two) times daily., Disp: , Rfl:  .  FLOVENT HFA 220 MCG/ACT inhaler, Inhale 1 puff into the lungs daily. , Disp: , Rfl: 1 .  glimepiride (AMARYL) 1 MG tablet, , Disp: , Rfl:  .  Ibuprofen (ADVIL) 200 MG CAPS, Take 2 capsules by mouth 3 (three) times daily as needed. For migraines, Disp: , Rfl:  .  interferon beta-1b (BETASERON) 0.3 MG injection, Inject 0.25 mg into the skin every other day., Disp: , Rfl:  .  levothyroxine (SYNTHROID, LEVOTHROID) 100 MCG tablet, TK 1 T PO QD, Disp: , Rfl: 0 .  meclizine (ANTIVERT) 12.5 MG tablet, TAKE 1 TABLET(12.5 MG) BY MOUTH THREE TIMES DAILY AS NEEDED FOR DIZZINESS, Disp: 90 tablet, Rfl: 0 .  metoprolol succinate (TOPROL-XL) 50 MG 24 hr tablet, Take 1 tablet (50 mg total) by mouth daily., Disp: 90 tablet, Rfl: 0 .  ONE TOUCH ULTRA TEST test strip, USE TO CHECK FASTING AND PRE-DINNER BLOOD GLUCOSE BID, Disp: , Rfl: 12 .  ONETOUCH DELICA LANCETS [id number] MISC, USE TO CHECK FASTING AND PRE-DINNER BLOOD GLUCOSE BID, Disp: , Rfl: 12 .  potassium citrate (UROCIT-K) 10 MEQ (1080 MG) SR tablet, Take 10 mEq by mouth 3 (three) times daily with meals., Disp: , Rfl:  .  pyridOXINE (VITAMIN B-6) 100 MG tablet, Take 100 mg by mouth daily., Disp: , Rfl:  .  Pyridoxine HCl (VITAMIN B-6 PO), Take 1 tablet by mouth daily., Disp: , Rfl:  .  ranitidine (ZANTAC) 75 MG tablet, Take 75 mg by mouth at bedtime., Disp: , Rfl:  .  rOPINIRole (REQUIP) 1 MG tablet, TAKE 1 TABLET FOUR TIMES A DAY, Disp:  360 tablet, Rfl: 1 .  sertraline (ZOLOFT) 100 MG tablet, TAKE ONE AND ONE-HALF TABLETS DAILY, Disp: 135 tablet, Rfl: 0 .  traMADol (ULTRAM) 50 MG tablet, Take 1 tablet (50 mg total) by mouth 3 (three) times daily as needed., Disp: 90 tablet, Rfl: 3 .  traZODone (DESYREL) 100 MG tablet, Take 150 mg by mouth at bedtime., Disp: , Rfl:  .  Vitamin A 10000 units TABS, , Disp: , Rfl:   PAST MEDICAL HISTORY: Past Medical History  Diagnosis Date  . GERD (gastroesophageal reflux disease)   . Diverticula of colon   . Multiple sclerosis ([place])   . Obesity   . Allergy   . Hyperlipidemia   . Anxiety   . Chronic kidney disease     KIDNEY STONES WITH STENTS  . Eczema   .  Thyroid disease     hyperthyroidism  . Restless leg syndrome   . Hypercholesterolemia   . Depression   . Migraine headache   . Kidney stones   . PONV (postoperative nausea and vomiting)   . Palpitations   . Hypothyroidism   . Snoring   . Vertigo   . Unsteady gait   . Vision abnormalities   . Right sided sciatica 10/29/2015    PAST SURGICAL HISTORY: Past Surgical History  Procedure Laterality Date  . Cholecystectomy    . Lithotripsy    . Nissen fundoplication    . Finger surgery  x8  . Breast surgery      AUGMENTATION  . Hernia repair    . Carpel tunnel    . Dilation and curettage of uterus  1983  . Ventral hernia repair  07/25/2012    Procedure: LAPAROSCOPIC VENTRAL HERNIA;  Surgeon: Adin Hector, MD;  Location: [facility];  Service: General;  Laterality: N/A;    FAMILY HISTORY: Family History  Problem Relation Age of Onset  . Hyperlipidemia Mother   . Glaucoma Mother   . Hypertension Mother   . Heart disease Father   . Cancer Father     prostate  . Diabetes Father   . Dementia Father   . Cancer Brother     esophageal  . Arthritis Sister     RA    SOCIAL HISTORY:  Social History   Social History  . Marital Status: Married    Spouse Name: N/A  . Number of Children: N/A  . Years of Education:  N/A   Occupational History  . Not on file.   Social History Main Topics  . Smoking status: Former Research scientist (life sciences)  . Smokeless tobacco: Never Used  . Alcohol Use: No  . Drug Use: No  . Sexual Activity: Not on file   Other Topics Concern  . Not on file   Social History Narrative     PHYSICAL EXAM  Filed Vitals:   03/16/16 1540  BP: 140/80  Pulse: 88  Resp: 22  Height: 4\' 11"  (1.499 m)  Weight: 227 lb (102.967 kg)    Body mass index is 45.82 kg/(m^2).   General: The patient is well-developed and well-nourished and in no acute distress  Musculoskeletal:  Lumbar paraspinal muscles are mildly-tender but she is more tender over right > left piriformis muscles  Neurologic Exam  Mental status: The patient is alert and oriented x 3 at the time of the examination. The patient has apparent normal recent and remote memory, with an apparently normal attention span and concentration ability.   Speech is normal.  Cranial nerves: Extraocular movements are full.   There is good facial sensation to soft touch bilaterally.Facial strength is normal.  Trapezius and sternocleidomastoid strength is normal. No dysarthria is noted.  The tongue is midline, and the patient has symmetric elevation of the soft palate. No obvious hearing deficits are noted.  Motor:  Muscle bulk is normal.   Tone is normal. Strength is  5 / 5 in all 4 extremities.   Sensory: Sensory testing is intact to soft touch.     Gait and station: Station is normal.   Gait is wide and tandem is wide.     Reflexes: Deep tendon reflexes are symmetric and normal bilaterally.       DIAGNOSTIC DATA (LABS, IMAGING, TESTING) - I reviewed patient records, labs, notes, testing and imaging myself where available.    ASSESSMENT AND PLAN  Bilateral sciatica  Facet syndrome  Multiple sclerosis ([facility])    1.    Trigger point injection of  Bilateral piriformis muscles with 80 mg depo-Medrol in 6 ml Marcaine she tolerated the  procedure well and there were no complications. 2.   Take tramadol 3 or 4 pills a day. If she does not get a significant benefit, would recommend increasing to hydrocodone 3 times a day. 3.   She plans on following up with Dr. Gordy Clement. Hopefully, shewill be covered when insurance changes later in year.    4.   She will return to see me in 5 - 6 months or sooner if she has new or worsening neurologic symptoms.  Travone Georg A. Felecia Shelling, MD, PhD [id number], [id number] PM Certified in Neurology, Clinical Neurophysiology, Sleep Medicine, Pain Medicine and Neuroimaging  [facility] [address] [place], [place] [address] [phone]

## 2016-03-16 NOTE — Telephone Encounter (Signed)
Patient is calling and would like to see Dr. Felecia Shelling this week as she is in a lot of pain in her back and needs a steriod shot.  Please call.

## 2016-03-16 NOTE — Telephone Encounter (Signed)
I have spoken with Jocelyn Wilson and given her an appt. for this afternoon/fim

## 2016-03-19 ENCOUNTER — Other Ambulatory Visit: Payer: Self-pay | Admitting: Neurology

## 2016-03-29 ENCOUNTER — Encounter: Payer: Self-pay | Admitting: *Deleted

## 2016-04-05 ENCOUNTER — Telehealth: Payer: Self-pay | Admitting: Neurology

## 2016-04-05 MED ORDER — MECLIZINE HCL 12.5 MG PO TABS: 12.5000 mg | ORAL_TABLET | Freq: Three times a day (TID) | ORAL | Status: DC | PRN

## 2016-04-05 MED ORDER — ROPINIROLE HCL 1 MG PO TABS: 1.0000 mg | ORAL_TABLET | Freq: Four times a day (QID) | ORAL | Status: DC

## 2016-04-05 NOTE — Addendum Note (Signed)
Addended by: France Ravens I on: 04/05/2016 05:41 PM   Modules accepted: Orders

## 2016-04-05 NOTE — Telephone Encounter (Signed)
Patient requesting refill of meclizine (ANTIVERT) 12.5 MG tablet , rOPINIRole (REQUIP) 1 MG tablet ( [facility] for refill [facility]. Pt will not have enough until shipment. Can a rx be called in for 2 weeks? )  Pharmacy: [id number] - [place], [address] AT [address]  Pt also said her back is hurting more and wants to know when she can come in for more injections. Please call [phone]

## 2016-04-05 NOTE — Telephone Encounter (Signed)
Requip and Antivert escribed to [facility].  I attempted to contact Jocelyn Wilson but received message that "the party you are calling is not accepting calls at this time."

## 2016-04-07 ENCOUNTER — Telehealth: Payer: Self-pay | Admitting: *Deleted

## 2016-04-07 MED ORDER — ROPINIROLE HCL 1 MG PO TABS: 1.0000 mg | ORAL_TABLET | Freq: Four times a day (QID) | ORAL | Status: DC

## 2016-04-07 NOTE — Telephone Encounter (Signed)
Requip escribed to [facility] per faxed request/fim

## 2016-04-21 ENCOUNTER — Telehealth: Payer: Self-pay | Admitting: Neurology

## 2016-04-21 NOTE — Telephone Encounter (Signed)
Attempted to contact pt.  Received message asking me to enter the # [phone] to prove I am not a robo caller, then received message that "the party you are calling is not available at this time"/fim

## 2016-04-21 NOTE — Telephone Encounter (Signed)
Patient called to advise "she is going on vacation, has early morning flight on Wednesday July 19th, would like to get the 2 shots in her back like she got before, states the shots did work and if unable to get the 2 shots before her vacation, when can she get them again?"

## 2016-04-22 NOTE — Telephone Encounter (Signed)
Patient called regarding messages she has left, has not heard from anyone. Advised patient of message below. Patient asks that Faith call her cell phone [phone].

## 2016-04-22 NOTE — Telephone Encounter (Signed)
I have spoken with Jocelyn Wilson and given w/i appt. for Monday 04-26-16 at 2pm for tpi's/fim

## 2016-04-26 ENCOUNTER — Ambulatory Visit (INDEPENDENT_AMBULATORY_CARE_PROVIDER_SITE_OTHER): Payer: BLUE CROSS/BLUE SHIELD | Admitting: Neurology

## 2016-04-26 ENCOUNTER — Encounter: Payer: Self-pay | Admitting: Neurology

## 2016-04-26 VITALS — BP 156/78 | HR 78 | Resp 20 | Ht 59.0 in | Wt 227.0 lb

## 2016-04-26 DIAGNOSIS — M5432 Sciatica, left side: Secondary | ICD-10-CM

## 2016-04-26 DIAGNOSIS — G35 Multiple sclerosis: Secondary | ICD-10-CM

## 2016-04-26 DIAGNOSIS — M5431 Sciatica, right side: Secondary | ICD-10-CM | POA: Diagnosis not present

## 2016-04-26 DIAGNOSIS — F329 Major depressive disorder, single episode, unspecified: Secondary | ICD-10-CM

## 2016-04-26 DIAGNOSIS — F32A Depression, unspecified: Secondary | ICD-10-CM

## 2016-04-26 DIAGNOSIS — M545 Low back pain: Secondary | ICD-10-CM

## 2016-04-26 DIAGNOSIS — M47817 Spondylosis without myelopathy or radiculopathy, lumbosacral region: Secondary | ICD-10-CM

## 2016-04-26 NOTE — Progress Notes (Signed)
[facility]  PATIENT: Jocelyn Wilson DOB: [birth year]  REFERRING DOCTOR OR PCP:  Adaku Nnodi SOURCE: Patient  _________________________________   HISTORICAL  CHIEF COMPLAINT:  Chief Complaint  Patient presents with  . Back Pain    Sts. she is having more bilat lbp, would like tpi's today if appropriate/fim    HISTORY OF PRESENT ILLNESS:  Jocelyn Wilson is a 65 year old woman with multiple sclerosis, back pain sciatica, and migraines.  Her LBP is acting back up again.    She felt much better for one month after a bilateral piriformis trigger point injection at the last visit.   Pain is mostly in the right > left buttock, and across the lower back.   Pain sometimes will radiate to the proximal leg but never down towards the foot    MRI of the lumbar spine  shows severe degenerative disc disease and facet hypertrophy at L4-L5. She has had several pain management injection procedures.  She has had radiofrequency ablations of the medial branch nerves with benefit. She also has had ESI's, SI joint injections, piriformis muscles injections with less benefit.   She had RFA of the medial branch nerves 08/2014 with some benefit and got much more benefit after also doing PT 11/15 - 1/16.   MS:   Her MS has been stable. She denies any recent exacerbations. She continues on Betaseron without difficulty. MRI 2017 has shown periventricular white matter foci consistent with multiple sclerosis. When compared to prior MRI dated 08/25/2009, there were no new lesions. The older scan also showed a small left medullary focus that was not observed on the more recent study.      She is on Betaseron.   She denies any new changes with gait, strength or sensation. She denies any significant vision or bladder problems.      Mood:  She has a chronic mild depression and often feels sad with occasional crying. She takes sertraline, BuSpar and alprazolam. She feels mood is better this visit than it  was last visit.     MS History:   Her MS was diagnosed around 2004.    She was actually seeing me for migraine headache when she presented with optic neuritis and vertigo and had another MRI.   She had interval development of multiple new foci and underwent a lumbar puncture.  CSF was also consistent with MS.   She was placed on Betaseron and continues on that medication with good control of the MS. No definite exacerbations.    REVIEW OF SYSTEMS: Constitutional: No fevers, chills, sweats, or change in appetite Eyes: No visual changes, double vision, eye pain Ear, nose and throat: No hearing loss, ear pain, nasal congestion, sore throat Cardiovascular: No chest pain, palpitations Respiratory: No shortness of breath at rest or with exertion.   No wheezes GastrointestinaI: No nausea, vomiting, diarrhea, abdominal pain, fecal incontinence Genitourinary: No dysuria, urinary retention or frequency.  No nocturia. Musculoskeletal: No neck pain, back pain Integumentary: No rash, pruritus, skin lesions Neurological: as above Psychiatric: No depression at this time.  No anxiety Endocrine: No palpitations, diaphoresis, change in appetite, change in weigh or increased thirst Hematologic/Lymphatic: No anemia, purpura, petechiae. Allergic/Immunologic: No itchy/runny eyes, nasal congestion, recent allergic reactions, rashes  ALLERGIES: Allergies  Allergen Reactions  . Iron Anaphylaxis    Iv iron  . Ciprofloxacin     Stomach upset  . Contrast Media [Iodinated Diagnostic Agents] Itching    Rash and itching  . Sulfa Antibiotics  Rash and itching  . Tape Rash    Adhesive tape    HOME MEDICATIONS:  Current outpatient prescriptions:  .  ALPRAZolam (XANAX) 0.5 MG tablet, Take 0.5 mg by mouth., Disp: , Rfl:  .  atorvastatin (LIPITOR) 20 MG tablet, Take 20 mg by mouth daily., Disp: , Rfl:  .  busPIRone (BUSPAR) 30 MG tablet, , Disp: , Rfl:  .  Cholecalciferol (VITAMIN D) 2000 units tablet, ,  Disp: , Rfl:  .  EPINEPHrine (EPIPEN) 0.3 mg/0.3 mL DEVI, Inject 0.3 mg into the muscle as needed. , Disp: , Rfl:  .  Ergocalciferol (VITAMIN D2) 2000 UNITS TABS, Take 2,000 Units by mouth daily. , Disp: , Rfl:  .  esomeprazole (NEXIUM) 40 MG capsule, Take 40 mg by mouth 2 (two) times daily. , Disp: , Rfl:  .  ferrous sulfate 325 (65 FE) MG tablet, Take 325 mg by mouth 2 (two) times daily., Disp: , Rfl:  .  FLOVENT HFA 220 MCG/ACT inhaler, Inhale 1 puff into the lungs daily. , Disp: , Rfl: 1 .  glimepiride (AMARYL) 1 MG tablet, , Disp: , Rfl:  .  Ibuprofen (ADVIL) 200 MG CAPS, Take 2 capsules by mouth 3 (three) times daily as needed. For migraines, Disp: , Rfl:  .  interferon beta-1b (BETASERON) 0.3 MG injection, Inject 0.25 mg into the skin every other day., Disp: , Rfl:  .  levothyroxine (SYNTHROID, LEVOTHROID) 100 MCG tablet, TK 1 T PO QD, Disp: , Rfl: 0 .  meclizine (ANTIVERT) 12.5 MG tablet, Take 1 tablet (12.5 mg total) by mouth 3 (three) times daily as needed for dizziness., Disp: 90 tablet, Rfl: 0 .  metoprolol succinate (TOPROL-XL) 50 MG 24 hr tablet, Take 1 tablet (50 mg total) by mouth daily., Disp: 90 tablet, Rfl: 0 .  ONE TOUCH ULTRA TEST test strip, USE TO CHECK FASTING AND PRE-DINNER BLOOD GLUCOSE BID, Disp: , Rfl: 12 .  ONETOUCH DELICA LANCETS 99991111 MISC, USE TO CHECK FASTING AND PRE-DINNER BLOOD GLUCOSE BID, Disp: , Rfl: 12 .  potassium citrate (UROCIT-K) 10 MEQ (1080 MG) SR tablet, Take 10 mEq by mouth 3 (three) times daily with meals., Disp: , Rfl:  .  pyridOXINE (VITAMIN B-6) 100 MG tablet, Take 100 mg by mouth daily., Disp: , Rfl:  .  Pyridoxine HCl (VITAMIN B-6 PO), Take 1 tablet by mouth daily., Disp: , Rfl:  .  ranitidine (ZANTAC) 75 MG tablet, Take 75 mg by mouth at bedtime., Disp: , Rfl:  .  rOPINIRole (REQUIP) 1 MG tablet, Take 1 tablet (1 mg total) by mouth 4 (four) times daily., Disp: 360 tablet, Rfl: 3 .  sertraline (ZOLOFT) 100 MG tablet, TAKE ONE AND ONE-HALF TABLETS  DAILY, Disp: 135 tablet, Rfl: 0 .  traMADol (ULTRAM) 50 MG tablet, Take 1 tablet (50 mg total) by mouth 3 (three) times daily as needed., Disp: 90 tablet, Rfl: 3 .  traZODone (DESYREL) 100 MG tablet, Take 150 mg by mouth at bedtime., Disp: , Rfl:  .  Vitamin A 10000 units TABS, , Disp: , Rfl:   PAST MEDICAL HISTORY: Past Medical History  Diagnosis Date  . GERD (gastroesophageal reflux disease)   . Diverticula of colon   . Multiple sclerosis ([place])   . Obesity   . Allergy   . Hyperlipidemia   . Anxiety   . Chronic kidney disease     KIDNEY STONES WITH STENTS  . Eczema   . Thyroid disease     hyperthyroidism  .  Restless leg syndrome   . Hypercholesterolemia   . Depression   . Migraine headache   . Kidney stones   . PONV (postoperative nausea and vomiting)   . Palpitations   . Hypothyroidism   . Snoring   . Vertigo   . Unsteady gait   . Vision abnormalities   . Right sided sciatica 10/29/2015    PAST SURGICAL HISTORY: Past Surgical History  Procedure Laterality Date  . Cholecystectomy    . Lithotripsy    . Nissen fundoplication    . Finger surgery  x8  . Breast surgery      AUGMENTATION  . Hernia repair    . Carpel tunnel    . Dilation and curettage of uterus  1983  . Ventral hernia repair  07/25/2012    Procedure: LAPAROSCOPIC VENTRAL HERNIA;  Surgeon: Adin Hector, MD;  Location: [facility];  Service: General;  Laterality: N/A;    FAMILY HISTORY: Family History  Problem Relation Age of Onset  . Hyperlipidemia Mother   . Glaucoma Mother   . Hypertension Mother   . Heart disease Father   . Cancer Father     prostate  . Diabetes Father   . Dementia Father   . Cancer Brother     esophageal  . Arthritis Sister     RA    SOCIAL HISTORY:  Social History   Social History  . Marital Status: Married    Spouse Name: N/A  . Number of Children: N/A  . Years of Education: N/A   Occupational History  . Not on file.   Social History Main Topics  . Smoking  status: Former Research scientist (life sciences)  . Smokeless tobacco: Never Used  . Alcohol Use: No  . Drug Use: No  . Sexual Activity: Not on file   Other Topics Concern  . Not on file   Social History Narrative     PHYSICAL EXAM  Filed Vitals:   04/26/16 1407  BP: 156/78  Pulse: 78  Resp: 20  Height: 4\' 11"  (1.499 m)  Weight: 227 lb (102.967 kg)    Body mass index is 45.82 kg/(m^2).   General: The patient is well-developed and well-nourished and in no acute distress  Musculoskeletal:  She is tender over right > left piriformis muscles more so than lumbar paraspinal muscles  Neurologic Exam  Mental status: The patient is alert and oriented x 3 at the time of the examination. The patient has apparent normal recent and remote memory, with an apparently normal attention span and concentration ability.   Speech is normal.  Cranial nerves: Extraocular movements are full.   There is good facial sensation to soft touch bilaterally.Facial strength is normal.  Trapezius and sternocleidomastoid strength is normal. No dysarthria is noted.  The tongue is midline, and the patient has symmetric elevation of the soft palate. No obvious hearing deficits are noted.  Motor:  Muscle bulk is normal.   Tone is normal. Strength is  5 / 5 in all 4 extremities.   Sensory: Sensory testing is intact to soft touch.     Gait and station: Station is normal.   Gait is wide and arthritic  and tandem is wide.     Reflexes: Deep tendon reflexes are symmetric and normal bilaterally.       DIAGNOSTIC DATA (LABS, IMAGING, TESTING) - I reviewed patient records, labs, notes, testing and imaging myself where available.    ASSESSMENT AND PLAN  Bilateral sciatica  Multiple sclerosis (HCC)  Lumbosacral facet joint syndrome  Clinical depression   1.    Trigger point injection of  Bilateral piriformis muscles with 80 mg depo-Medrol in 6 ml [place].  She tolerated the procedure well and there were no complications. 2.    Continue tramadol 3 or 4 pills a day. If she does not get a significant benefit, would recommend increasing to hydrocodone 3 times a day. 3.   She plans on following up with Dr. Gordy Clement for RFA in OCtober or November.   4.   She will return to see me in 2 months or sooner if she has new or worsening neurologic symptoms.  Dawson Hollman A. Felecia Shelling, MD, PhD [id number], [id number] PM Certified in Neurology, Clinical Neurophysiology, Sleep Medicine, Pain Medicine and Neuroimaging  [facility] [address] [place], [place] [address] [phone]  ;,

## 2016-05-12 ENCOUNTER — Other Ambulatory Visit: Payer: Self-pay | Admitting: Neurology

## 2016-05-16 ENCOUNTER — Other Ambulatory Visit: Payer: Self-pay | Admitting: Neurology

## 2016-05-20 ENCOUNTER — Telehealth: Payer: Self-pay | Admitting: Neurology

## 2016-05-20 MED ORDER — TRAZODONE HCL 150 MG PO TABS: 150.0000 mg | ORAL_TABLET | Freq: Every day | ORAL | 3 refills | Status: DC

## 2016-05-20 NOTE — Telephone Encounter (Signed)
Ok to fill #[phone] 3 refills one po nightly

## 2016-05-20 NOTE — Telephone Encounter (Signed)
Patient requesting refill of traZODone (DESYREL) 100 MG tablet  Pharmacy: [facility]  Pt said she is out of refills.

## 2016-05-20 NOTE — Telephone Encounter (Signed)
I do not see that you have prescribed this for her. Are you willing to write or advise to get from prior provider?

## 2016-05-20 NOTE — Telephone Encounter (Signed)
Rx refilled.

## 2016-06-03 ENCOUNTER — Other Ambulatory Visit: Payer: Self-pay | Admitting: Neurology

## 2016-06-07 ENCOUNTER — Other Ambulatory Visit: Payer: Self-pay | Admitting: Neurology

## 2016-06-07 ENCOUNTER — Telehealth: Payer: Self-pay | Admitting: Neurology

## 2016-06-07 DIAGNOSIS — G8929 Other chronic pain: Secondary | ICD-10-CM

## 2016-06-07 DIAGNOSIS — M5416 Radiculopathy, lumbar region: Secondary | ICD-10-CM

## 2016-06-07 DIAGNOSIS — M47817 Spondylosis without myelopathy or radiculopathy, lumbosacral region: Secondary | ICD-10-CM

## 2016-06-07 NOTE — Telephone Encounter (Signed)
Pt called to advise medicare will come into effect Oct 1 and she is requesting to be referred to Dr Patton Salles for RFA. Please call

## 2016-06-07 NOTE — Telephone Encounter (Signed)
I have spoken with Yudith this morning.  She would like RFA 10-1 or after, once Medicare is active.  I have spoken with Angelita Ingles at [facility].  She is unable to sched. RFA this far out, requests [facility] call her the last week of Sept. to schedule appt.  New order placed in EPIC, with Roberta's help.  I have spoken with Nilsa again and advised she call Angelita Ingles the last week of September to schedule/fim

## 2016-06-16 ENCOUNTER — Encounter: Payer: Self-pay | Admitting: Neurology

## 2016-06-16 ENCOUNTER — Ambulatory Visit (INDEPENDENT_AMBULATORY_CARE_PROVIDER_SITE_OTHER): Payer: BLUE CROSS/BLUE SHIELD | Admitting: Neurology

## 2016-06-16 VITALS — BP 126/74 | HR 78 | Resp 18 | Ht 59.0 in | Wt 235.0 lb

## 2016-06-16 DIAGNOSIS — F411 Generalized anxiety disorder: Secondary | ICD-10-CM | POA: Diagnosis not present

## 2016-06-16 DIAGNOSIS — G35 Multiple sclerosis: Secondary | ICD-10-CM

## 2016-06-16 DIAGNOSIS — G2581 Restless legs syndrome: Secondary | ICD-10-CM | POA: Diagnosis not present

## 2016-06-16 DIAGNOSIS — M47817 Spondylosis without myelopathy or radiculopathy, lumbosacral region: Secondary | ICD-10-CM

## 2016-06-16 DIAGNOSIS — M545 Low back pain: Secondary | ICD-10-CM

## 2016-06-16 NOTE — Progress Notes (Signed)
[facility]  PATIENT: Jocelyn Wilson DOB: 10/09/1951  REFERRING DOCTOR OR PCP:  Adaku Nnodi SOURCE: Patient  _________________________________   HISTORICAL  CHIEF COMPLAINT:  Chief Complaint  Patient presents with  . Multiple Sclerosis    Sts. she continues to tolerate Betaseron well.  Several issues 1--Back pain--she is hoping to have Lumbar RF procedure at [facility] during the first week of October 1, when she switches to Medicare.  2--Sts. she is having more pain/tingling in lower legs. 3--she would like to review med list to make sure she is on generics of all meds. 4--she would like to discuss an article she saw on facebook, by a female doctor who stated she was very close to a cure for MS involving stem cells, and   . Back Pain    predicted a cure in 2020/fim    HISTORY OF PRESENT ILLNESS:  Jocelyn Wilson is a 65 year old woman with multiple sclerosis, back pain (facet syndrome), and migraines.  Her LBP is severe at times.   Pain is mostly in the right > left buttock, and across the lower back.   Pain sometimes will radiate to the proximal leg but never down towards the foot    MRI of the lumbar spine shows severe degenerative disc disease and facet hypertrophy at L4-L5.   She has had radiofrequency ablations of the medial branch nerves with benefit. She also has had ESI's, SI joint injections, piriformis muscles injections with less benefit.   She had RFA of the medial branch nerves 08/2014 with some benefit and got much more benefit after also doing PT 11/15 - 1/16.     She felt better for one month after a bilateral piriformis trigger point injection at the last visit.  Second shot did not help as much.    She will be having RFA by Dr. Jola Baptist soon.    MS:   Her MS has been stable. She denies any recent exacerbations. She continues on Betaseron and tolerates it well. MRI 2017 has shown periventricular white matter foci consistent with multiple  sclerosis. When compared to prior MRI dated 08/25/2009, there were no new lesions. The older scan also showed a small left medullary focus that was not observed on the more recent study.      She is on Betaseron.   She denies any new changes with gait, strength or sensation. She denies any significant vision or bladder problems.    Dysesthesias:   She has painful dysesthesia in her shins and feet at times.     Sleep:   She has RLS day and night but worse at night.   Ropinirole greatly helps RLS and she now takes qid.    She gets slightly sleepy at times.     Mood:  She has a chronic mild depression and often feels sad with occasional crying. She takes sertraline, BuSpar and alprazolam. She feels mood is better this visit than it was last visit.     Migraine headaches:  This is generally doing well.    MS History:   Her MS was diagnosed around 2004.    She was actually seeing me for migraine headache when she presented with optic neuritis and vertigo and had another MRI.   She had interval development of multiple new foci and underwent a lumbar puncture.  CSF was also consistent with MS.   She was placed on Betaseron and continues on that medication with good control of the MS. No  definite exacerbations.    REVIEW OF SYSTEMS: Constitutional: No fevers, chills, sweats, or change in appetite Eyes: No visual changes, double vision, eye pain Ear, nose and throat: No hearing loss, ear pain, nasal congestion, sore throat Cardiovascular: No chest pain, palpitations Respiratory: No shortness of breath at rest or with exertion.   No wheezes GastrointestinaI: No nausea, vomiting, diarrhea, abdominal pain, fecal incontinence Genitourinary: No dysuria, urinary retention or frequency.  No nocturia. Musculoskeletal: No neck pain, back pain Integumentary: No rash, pruritus, skin lesions Neurological: as above Psychiatric: No depression at this time.  No anxiety Endocrine: No palpitations, diaphoresis,  change in appetite, change in weigh or increased thirst Hematologic/Lymphatic: No anemia, purpura, petechiae. Allergic/Immunologic: No itchy/runny eyes, nasal congestion, recent allergic reactions, rashes  ALLERGIES: Allergies  Allergen Reactions  . Iron Anaphylaxis    Iv iron  . Ciprofloxacin     Stomach upset  . Contrast Media [Iodinated Diagnostic Agents] Itching    Rash and itching  . Sulfa Antibiotics     Rash and itching  . Tape Rash    Adhesive tape    HOME MEDICATIONS:  Current Outpatient Prescriptions:  .  atorvastatin (LIPITOR) 20 MG tablet, Take 20 mg by mouth daily., Disp: , Rfl:  .  BETASERON 0.3 MG KIT injection, INJECT 0.25 MG UNDER THE SKIN EVERY OTHER DAY AS DIRECTED, Disp: 42 each, Rfl: 2 .  busPIRone (BUSPAR) 30 MG tablet, , Disp: , Rfl:  .  Cholecalciferol (VITAMIN D) 2000 units tablet, , Disp: , Rfl:  .  EPINEPHrine (EPIPEN) 0.3 mg/0.3 mL DEVI, Inject 0.3 mg into the muscle as needed. , Disp: , Rfl:  .  Ergocalciferol (VITAMIN D2) 2000 UNITS TABS, Take 2,000 Units by mouth daily. , Disp: , Rfl:  .  esomeprazole (NEXIUM) 40 MG capsule, Take 40 mg by mouth 2 (two) times daily. , Disp: , Rfl:  .  ferrous sulfate 325 (65 FE) MG tablet, Take 325 mg by mouth 2 (two) times daily., Disp: , Rfl:  .  FLOVENT HFA 220 MCG/ACT inhaler, Inhale 1 puff into the lungs daily. , Disp: , Rfl: 1 .  glimepiride (AMARYL) 1 MG tablet, , Disp: , Rfl:  .  Ibuprofen (ADVIL) 200 MG CAPS, Take 2 capsules by mouth 3 (three) times daily as needed. For migraines, Disp: , Rfl:  .  interferon beta-1b (BETASERON) 0.3 MG injection, Inject 0.25 mg into the skin every other day., Disp: , Rfl:  .  levothyroxine (SYNTHROID, LEVOTHROID) 100 MCG tablet, TK 1 T PO QD, Disp: , Rfl: 0 .  meclizine (ANTIVERT) 12.5 MG tablet, TAKE 1 TABLET(12.5 MG) BY MOUTH THREE TIMES DAILY AS NEEDED FOR DIZZINESS, Disp: 90 tablet, Rfl: 0 .  metoprolol succinate (TOPROL-XL) 50 MG 24 hr tablet, TAKE 1 TABLET DAILY (PLEASE  CALL AND SCHEDULE A ONE YEAR FOLLOW UP APPOINTMENT), Disp: 90 tablet, Rfl: 3 .  ONE TOUCH ULTRA TEST test strip, USE TO CHECK FASTING AND PRE-DINNER BLOOD GLUCOSE BID, Disp: , Rfl: 12 .  ONETOUCH DELICA LANCETS 48G MISC, USE TO CHECK FASTING AND PRE-DINNER BLOOD GLUCOSE BID, Disp: , Rfl: 12 .  potassium citrate (UROCIT-K) 10 MEQ (1080 MG) SR tablet, Take 10 mEq by mouth 3 (three) times daily with meals., Disp: , Rfl:  .  ranitidine (ZANTAC) 75 MG tablet, Take 75 mg by mouth at bedtime., Disp: , Rfl:  .  rOPINIRole (REQUIP) 1 MG tablet, Take 1 tablet (1 mg total) by mouth 4 (four) times daily., Disp: 360 tablet,  Rfl: 3 .  sertraline (ZOLOFT) 100 MG tablet, TAKE ONE AND ONE-HALF TABLETS DAILY, Disp: 135 tablet, Rfl: 3 .  traMADol (ULTRAM) 50 MG tablet, Take 1 tablet (50 mg total) by mouth 3 (three) times daily as needed., Disp: 90 tablet, Rfl: 3 .  traZODone (DESYREL) 150 MG tablet, Take 1 tablet (150 mg total) by mouth at bedtime., Disp: 90 tablet, Rfl: 3 .  Vitamin A 10000 units TABS, , Disp: , Rfl:  .  ALPRAZolam (XANAX) 0.5 MG tablet, Take 0.5 mg by mouth., Disp: , Rfl:  .  pyridOXINE (VITAMIN B-6) 100 MG tablet, Take 100 mg by mouth daily., Disp: , Rfl:   PAST MEDICAL HISTORY: Past Medical History:  Diagnosis Date  . Allergy   . Anxiety   . Chronic kidney disease    KIDNEY STONES WITH STENTS  . Depression   . Diverticula of colon   . Eczema   . GERD (gastroesophageal reflux disease)   . Hypercholesterolemia   . Hyperlipidemia   . Hypothyroidism   . Kidney stones   . Migraine headache   . Multiple sclerosis ([place])   . Obesity   . Palpitations   . PONV (postoperative nausea and vomiting)   . Restless leg syndrome   . Right sided sciatica 10/29/2015  . Snoring   . Thyroid disease    hyperthyroidism  . Unsteady gait   . Vertigo   . Vision abnormalities     PAST SURGICAL HISTORY: Past Surgical History:  Procedure Laterality Date  . BREAST SURGERY     AUGMENTATION  . carpel  tunnel    . CHOLECYSTECTOMY    . DILATION AND CURETTAGE OF UTERUS  1983  . FINGER SURGERY  x8  . HERNIA REPAIR    . LITHOTRIPSY    . NISSEN FUNDOPLICATION    . VENTRAL HERNIA REPAIR  07/25/2012   Procedure: LAPAROSCOPIC VENTRAL HERNIA;  Surgeon: Adin Hector, MD;  Location: [facility];  Service: General;  Laterality: N/A;    FAMILY HISTORY: Family History  Problem Relation Age of Onset  . Hyperlipidemia Mother   . Glaucoma Mother   . Hypertension Mother   . Heart disease Father   . Cancer Father     prostate  . Diabetes Father   . Dementia Father   . Cancer Brother     esophageal  . Arthritis Sister     RA    SOCIAL HISTORY:  Social History   Social History  . Marital status: Married    Spouse name: N/A  . Number of children: N/A  . Years of education: N/A   Occupational History  . Not on file.   Social History Main Topics  . Smoking status: Former Research scientist (life sciences)  . Smokeless tobacco: Never Used  . Alcohol use No  . Drug use: No  . Sexual activity: Not on file   Other Topics Concern  . Not on file   Social History Narrative  . No narrative on file     PHYSICAL EXAM  Vitals:   06/16/16 1408  BP: 126/74  Pulse: 78  Resp: 18  Weight: 235 lb (106.6 kg)  Height: '4\' 11"'  (1.499 m)    Body mass index is 47.46 kg/m.   General: The patient is well-developed and well-nourished and in no acute distress  Musculoskeletal:  She is tender over right > left piriformis muscles more so than lumbar paraspinal muscles  Neurologic Exam  Mental status: The patient is alert and oriented x  3 at the time of the examination. The patient has apparent normal recent and remote memory, with an apparently normal attention span and concentration ability.   Speech is normal.  Cranial nerves: Extraocular movements are full.   There is good facial sensation to soft touch bilaterally.Facial strength is normal.  Trapezius and sternocleidomastoid strength is normal. No dysarthria is  noted.  The tongue is midline, and the patient has symmetric elevation of the soft palate. No obvious hearing deficits are noted.  Motor:  Muscle bulk is normal.   Tone is normal. Strength is  5 / 5 in all 4 extremities.   Sensory: Sensory testing is intact to soft touch.     Gait and station: Station is normal.   Gait is wide and arthritic  and tandem is wide.     Reflexes: Deep tendon reflexes are symmetric and normal bilaterally.       DIAGNOSTIC DATA (LABS, IMAGING, TESTING) - I reviewed patient records, labs, notes, testing and imaging myself where available.    ASSESSMENT AND PLAN  Multiple sclerosis (HCC)  Lumbosacral facet joint syndrome - Plan: Radiofrequency,Lumbar  Anxiety state  Restless leg syndrome  1.    Betaseron for MS. 2.   Continue tramadol 3 or 4 pills a day. Continue ropinirole 3.   She plans on following up with Dr. Gordy Clement for RFA in October or November.   4.   She will return to see me in 2 months or sooner if she has new or worsening neurologic symptoms.  Brittiney Dicostanzo A. Felecia Shelling, MD, PhD 11/13/3610, 2:44 PM Certified in Neurology, Clinical Neurophysiology, Sleep Medicine, Pain Medicine and Neuroimaging  [facility] [address], [place], [place] [address] [phone]  ;,

## 2016-06-21 ENCOUNTER — Other Ambulatory Visit: Payer: Self-pay | Admitting: Neurology

## 2016-07-13 ENCOUNTER — Ambulatory Visit
Admission: RE | Admit: 2016-07-13 | Discharge: 2016-07-13 | Disposition: A | Discharge status: Home / Self Care | Payer: Medicare Other | Source: Ambulatory Visit | Attending: Neurology | Admitting: Neurology

## 2016-07-13 ENCOUNTER — Other Ambulatory Visit: Payer: Self-pay | Admitting: Neurology

## 2016-07-13 VITALS — BP 134/81 | HR 76 | Temp 98.0°F | Resp 22

## 2016-07-13 DIAGNOSIS — M47817 Spondylosis without myelopathy or radiculopathy, lumbosacral region: Secondary | ICD-10-CM

## 2016-07-13 DIAGNOSIS — G8929 Other chronic pain: Secondary | ICD-10-CM

## 2016-07-13 DIAGNOSIS — M5416 Radiculopathy, lumbar region: Secondary | ICD-10-CM

## 2016-07-13 DIAGNOSIS — M47819 Spondylosis without myelopathy or radiculopathy, site unspecified: Secondary | ICD-10-CM

## 2016-07-13 DIAGNOSIS — M47899 Other spondylosis, site unspecified: Secondary | ICD-10-CM

## 2016-07-13 MED ORDER — KETOROLAC TROMETHAMINE 30 MG/ML IJ SOLN
30.0000 mg | Freq: Once | INTRAMUSCULAR | Status: AC
  Administered 2016-07-13: 30 mg via INTRAVENOUS

## 2016-07-13 MED ORDER — MIDAZOLAM HCL 2 MG/2ML IJ SOLN
1.0000 mg | INTRAMUSCULAR | Status: DC | PRN
  Administered 2016-07-13: 0.5 mg via INTRAVENOUS

## 2016-07-13 MED ORDER — FENTANYL CITRATE (PF) 100 MCG/2ML IJ SOLN
25.0000 ug | INTRAMUSCULAR | Status: DC | PRN
  Administered 2016-07-13: 25 ug via INTRAVENOUS

## 2016-07-13 MED ORDER — SODIUM CHLORIDE 0.9 % IV SOLN
Freq: Once | INTRAVENOUS | Status: AC
  Administered 2016-07-13: 09:00:00 via INTRAVENOUS

## 2016-07-13 NOTE — Progress Notes (Signed)
Pre-medicated with Benadryl 50mg  PO one hour before procedure for tape allergy (so that she can tolerate adhesive grounding pad.)  jkl

## 2016-07-13 NOTE — Discharge Instructions (Signed)
Radio Frequency Ablation Post Procedure Discharge Instructions ° °1. May resume a regular diet and any medications that you routinely take (including pain medications). °2. No driving day of procedure. °3. Upon discharge go home and rest for at least 4 hours.  May use an ice pack as needed to injection sites on back. °4. Remove bandades later, today. ° ° ° °Please contact our office at [phone] for the following symptoms: ° °· Fever greater than 100 degrees °· Increased swelling, pain, or redness at injection site. ° ° °Thank you for visiting [facility]. °

## 2016-07-15 ENCOUNTER — Telehealth: Payer: Self-pay

## 2016-07-15 NOTE — Telephone Encounter (Signed)
Spoke with patient to see how she is feeling after her RFA here 07/13/16.  She reports significant soreness ("not really pain") yesterday (10/4) but states she is feeling better today.  I reminded her that the procedure produces symptoms like an internal sunburn at all of those sites we treated, so the soreness was to be expected for perhaps even a couple of weeks.  jkl

## 2016-07-20 ENCOUNTER — Other Ambulatory Visit: Payer: Self-pay

## 2016-07-20 MED ORDER — BUSPIRONE HCL 30 MG PO TABS: 30.0000 mg | ORAL_TABLET | Freq: Three times a day (TID) | ORAL | 1 refills | Status: DC

## 2016-07-20 MED ORDER — TRAZODONE HCL 150 MG PO TABS: 150.0000 mg | ORAL_TABLET | Freq: Every day | ORAL | 1 refills | Status: DC

## 2016-07-20 MED ORDER — MECLIZINE HCL 12.5 MG PO TABS: ORAL_TABLET | ORAL | 1 refills | Status: DC

## 2016-07-27 ENCOUNTER — Telehealth: Payer: Self-pay | Admitting: *Deleted

## 2016-07-27 MED ORDER — METOPROLOL SUCCINATE ER 50 MG PO TB24: ORAL_TABLET | ORAL | 3 refills | Status: DC

## 2016-07-27 NOTE — Telephone Encounter (Signed)
Toprol escribed to [facility] per faxed request/fim

## 2016-08-02 ENCOUNTER — Telehealth: Payer: Self-pay | Admitting: Neurology

## 2016-08-02 MED ORDER — MECLIZINE HCL 12.5 MG PO TABS: ORAL_TABLET | ORAL | 1 refills | Status: DC

## 2016-08-02 MED ORDER — TRAMADOL HCL 50 MG PO TABS: 50.0000 mg | ORAL_TABLET | Freq: Three times a day (TID) | ORAL | 3 refills | Status: DC | PRN

## 2016-08-02 MED ORDER — ROPINIROLE HCL 1 MG PO TABS: 1.0000 mg | ORAL_TABLET | Freq: Four times a day (QID) | ORAL | 3 refills | Status: DC

## 2016-08-02 MED ORDER — SERTRALINE HCL 100 MG PO TABS: 150.0000 mg | ORAL_TABLET | Freq: Every day | ORAL | 1 refills | Status: DC

## 2016-08-02 MED ORDER — BUSPIRONE HCL 30 MG PO TABS: 30.0000 mg | ORAL_TABLET | Freq: Three times a day (TID) | ORAL | 1 refills | Status: DC

## 2016-08-02 MED ORDER — TRAZODONE HCL 100 MG PO TABS: ORAL_TABLET | ORAL | 1 refills | Status: DC

## 2016-08-02 NOTE — Telephone Encounter (Signed)
I have spoken with Lisaann this afternoon.  90 day rx's of Buspar, Zoloft, Requip, Meclezine escribed to [facility]. Sts. last Trazodone rx. given was for 150mg , and she wants to decrease back to 100mg .  90 day rx. for Trazodone 100mg  escribed to [facility].  [facility] is ooo this week.  1st Tramadol rx. printed by mistake in his name.  I have destroyed that rx. and 2nd rx. for 30 day supply printed, to be signed by Dr. Krista Blue, then will fax to [facility]

## 2016-08-02 NOTE — Telephone Encounter (Signed)
Tramadol rx. escribed to [facility]/fim

## 2016-08-02 NOTE — Telephone Encounter (Signed)
Pt said she needs refill for trazodone 100mg . She said the last refill was for 150mg  but that was not correct. She needs 90 day supply/[facility]. Please. Also she wants to know how long she should take meclizine. She is still dizzy. That would need to go to [place] also if it is to be refilled.  Pt advised she did not ask for any of these to be refilled or to be sent to [facility]. She said she has not been notified by [facility] to pick them up. She said she is still taking traMADol (ULTRAM) 50 MG tablet 3 x day but she is still having throbbing in the low back. She wants to know how long will she need to keep taking this? If she is to continue it will need to be called to [place] Please call

## 2016-08-02 NOTE — Telephone Encounter (Signed)
Patient called requesting to add to previous message left.  "[facility] has not received Rx's for any of the medications; busPIRone (BUSPAR) 30 MG tablet, sertraline (ZOLOFT) 100 MG tablet, rOPINIRole (REQUIP) 1 MG tablet, meclizine (ANTIVERT) 12.5 MG tablet, traZODone (DESYREL) 150 MG tablet, traMADol (ULTRAM) 50 MG tablet" requests to call [phone].

## 2016-08-12 NOTE — Telephone Encounter (Signed)
Patient called to request refill of traMADol Veatrice Bourbon) 50 MG tablet, needs sent to [facility], [place], [place] [address] Phone [phone] where she is visiting her sister.

## 2016-08-13 NOTE — Telephone Encounter (Signed)
I have spoken with Jocelyn Wilson and let her know rx. has been sent to [facility] in [place]/fim

## 2016-08-13 NOTE — Telephone Encounter (Signed)
I have spoken with the pharmacist in [facility], to ensure they can accept a Tramadol rx. from RAS here in [place].  She stated they can take a verbal rx.  one 30 days rx. (#90, one po tid with 0 refills) given/fim

## 2016-09-06 ENCOUNTER — Telehealth: Payer: Self-pay | Admitting: Neurology

## 2016-09-06 DIAGNOSIS — M5431 Sciatica, right side: Secondary | ICD-10-CM

## 2016-09-06 NOTE — Telephone Encounter (Signed)
Patient is calling stating the RFA did not work. She is having a lot of pain and discomfort in her low back , sciatica and is having difficulty getting around. What is the next step?

## 2016-09-07 MED ORDER — HYDROCODONE-ACETAMINOPHEN 5-325 MG PO TABS: 1.0000 | ORAL_TABLET | Freq: Three times a day (TID) | ORAL | 0 refills | Status: DC | PRN

## 2016-09-07 NOTE — Telephone Encounter (Signed)
Per RAS, since no relief of back pain, right sided sciatica with esi's, rf, ok to give Hydrocodone 5/325mg  #90 one po q8 hrs prn, and offer referral to [facility].  Pt. is agreeable with this plan.  Rx. up front [facility] and referral to [facility] made/fim

## 2016-09-07 NOTE — Addendum Note (Signed)
Addended by: France Ravens I on: 09/07/2016 09:58 AM   Modules accepted: Orders

## 2016-10-18 ENCOUNTER — Telehealth: Payer: Self-pay | Admitting: Neurology

## 2016-10-18 MED ORDER — ESOMEPRAZOLE MAGNESIUM 40 MG PO CPDR: 40.0000 mg | DELAYED_RELEASE_CAPSULE | Freq: Two times a day (BID) | ORAL | 3 refills | Status: DC

## 2016-10-18 NOTE — Telephone Encounter (Signed)
Patient called in reference to having esomeprazole (NEXIUM) 40 MG capsule refilled with [facility].  Patient stated she takes medication 2 times daily, but prescription sent was for 1 time daily #90 and it needs to be #180. Please call

## 2016-10-18 NOTE — Telephone Encounter (Signed)
I have spoken with Jocelyn Wilson this morning.  Nexium rx. escribed to [facility] as requested/fim

## 2016-10-18 NOTE — Addendum Note (Signed)
Addended by: France Ravens I on: 10/18/2016 11:31 AM   Modules accepted: Orders

## 2016-12-14 DIAGNOSIS — Z0271 Encounter for disability determination: Secondary | ICD-10-CM

## 2017-01-19 ENCOUNTER — Telehealth: Payer: Self-pay | Admitting: Neurology

## 2017-01-19 MED ORDER — INTERFERON BETA-1B 0.3 MG ~~LOC~~ KIT: PACK | SUBCUTANEOUS | 2 refills | Status: DC

## 2017-01-19 NOTE — Telephone Encounter (Signed)
I have spoken with Raechal this am and advised rx. has been sent to [place] as requested/fim

## 2017-01-19 NOTE — Addendum Note (Signed)
Addended by: France Ravens I on: 01/19/2017 10:31 AM   Modules accepted: Orders

## 2017-01-19 NOTE — Telephone Encounter (Signed)
Pt called to inform that she is now on Medicare and that Dr Felecia Shelling needs to send a perscription of her BETASERON 0.3 MG KIT injection to [facility]) phone#[phone] fax [phone]

## 2017-02-18 ENCOUNTER — Other Ambulatory Visit: Payer: Self-pay | Admitting: Neurology

## 2017-05-05 ENCOUNTER — Encounter (INDEPENDENT_AMBULATORY_CARE_PROVIDER_SITE_OTHER): Payer: BLUE CROSS/BLUE SHIELD | Admitting: Family Medicine

## 2017-05-10 ENCOUNTER — Encounter (INDEPENDENT_AMBULATORY_CARE_PROVIDER_SITE_OTHER): Payer: Self-pay | Admitting: Family Medicine

## 2017-05-10 ENCOUNTER — Ambulatory Visit (INDEPENDENT_AMBULATORY_CARE_PROVIDER_SITE_OTHER): Payer: Medicare Other | Admitting: Family Medicine

## 2017-05-10 VITALS — BP 139/79 | HR 84 | Temp 98.3°F | Ht <= 58 in | Wt 232.0 lb

## 2017-05-10 DIAGNOSIS — Z6841 Body Mass Index (BMI) 40.0 and over, adult: Secondary | ICD-10-CM | POA: Diagnosis not present

## 2017-05-10 DIAGNOSIS — IMO0001 Reserved for inherently not codable concepts without codable children: Secondary | ICD-10-CM

## 2017-05-10 DIAGNOSIS — E038 Other specified hypothyroidism: Secondary | ICD-10-CM

## 2017-05-10 DIAGNOSIS — G35 Multiple sclerosis: Secondary | ICD-10-CM

## 2017-05-10 DIAGNOSIS — Z1331 Encounter for screening for depression: Secondary | ICD-10-CM

## 2017-05-10 DIAGNOSIS — E119 Type 2 diabetes mellitus without complications: Secondary | ICD-10-CM | POA: Diagnosis not present

## 2017-05-10 DIAGNOSIS — R0602 Shortness of breath: Secondary | ICD-10-CM | POA: Diagnosis not present

## 2017-05-10 DIAGNOSIS — E784 Other hyperlipidemia: Secondary | ICD-10-CM | POA: Diagnosis not present

## 2017-05-10 DIAGNOSIS — E669 Obesity, unspecified: Secondary | ICD-10-CM

## 2017-05-10 DIAGNOSIS — E7849 Other hyperlipidemia: Secondary | ICD-10-CM

## 2017-05-10 DIAGNOSIS — Z1389 Encounter for screening for other disorder: Secondary | ICD-10-CM | POA: Diagnosis not present

## 2017-05-10 DIAGNOSIS — R5383 Other fatigue: Secondary | ICD-10-CM

## 2017-05-10 DIAGNOSIS — Z0289 Encounter for other administrative examinations: Secondary | ICD-10-CM

## 2017-05-10 NOTE — Progress Notes (Signed)
Office: [phone]  /  Fax: [phone]   HPI:   Chief Complaint: OBESITY  Jocelyn Wilson (MR# [id number]) is a 66 y.o. female who presents on 05/10/2017 for obesity evaluation and treatment. Current BMI is Body mass index is 50.2 kg/m.Jocelyn Wilson has struggled with obesity for years and has been unsuccessful in either losing weight or maintaining long term weight loss. [name] attended our information session and states she is currently in the action stage of change and ready to dedicate time achieving and maintaining a healthier weight.  [name] has chronic back pain and needs to lose weight to help relieve back pain and possibly have back surgery. [name] wanted weight loss surgery but was not a candidate, so she was referred to [place] for weight loss. [name] states her family eats meals together she thinks her family will eat healthier with  her her desired weight loss is 116 lbs she started gaining weight over the last year her heaviest weight ever was 236 lbs. she has significant food cravings issues  she has binge eating behaviors she struggles with emotional eating    Fatigue [name] feels her energy is lower than it should be. This has worsened with weight gain and has not worsened recently. [name] admits to daytime somnolence and  admits to waking up still tired. Patient is at risk for obstructive sleep apnea. Patent has a history of symptoms of daytime fatigue and morning headache. Patient generally gets 7 hours of sleep per night, and states they generally have restful sleep. Snoring is present. Apneic episodes are not present. Epworth Sleepiness Score is 8  Dyspnea on exertion [name] notes increasing shortness of breath with exercising and seems to be worsening over time with weight gain. She notes getting out of breath sooner with activity than she used to. This has not gotten worse recently. [name] denies orthopnea.  Diabetes II [name] has a diagnosis of diabetes type II and is on  glimepiride. [name] states she is not checking BGs at home usually and fasting BGs range between 180's and 200's. She denies any hypoglycemic episodes. She  Is attempting to work on intensive lifestyle modifications including diet, exercise, and weight loss to help control her blood glucose levels.  Hypothyroid [name] has a diagnosis of hypothyroidism. She is on levothyroxine 137 mcg and is status post radioactive iodine. She denies hot or cold intolerance or palpitations, but does admit to ongoing fatigue.  Hyperlipidemia [name] has hyperlipidemia and is on Lipitor 20 mg and she is attempting to improve her cholesterol levels with intensive lifestyle modification including a low saturated fat diet, exercise and weight loss. She denies any chest pain, claudication or myalgias.  Multiple Sclerosis [name] has a diagnosis of multiple sclerosis and was unable to walk more than 5 minutes. She was unsteady on her feet and often feels dizzy. She is followed by Neurology.   Depression Screen [name]'s Food and Mood (modified PHQ-9) score was  Depression screen PHQ 2/9 05/10/2017  Decreased Interest 3  Down, Depressed, Hopeless 3  PHQ - 2 Score 6  Altered sleeping 1  Tired, decreased energy 3  Change in appetite 3  Feeling bad or failure about yourself  1  Trouble concentrating 0  Moving slowly or fidgety/restless 3  Suicidal thoughts 1  PHQ-9 Score 18    ALLERGIES: Allergies  Allergen Reactions   Iron Anaphylaxis    Iv iron   Ciprofloxacin Nausea Only   Contrast Media [Iodinated Diagnostic Agents] Itching and Rash   Sulfa Antibiotics  Itching and Rash   Tape Rash    Adhesive tape    MEDICATIONS: Current Outpatient Prescriptions on File Prior to Visit  Medication Sig Dispense Refill   atorvastatin (LIPITOR) 20 MG tablet Take 20 mg by mouth daily.     busPIRone (BUSPAR) 30 MG tablet Take 1 tablet (30 mg total) by mouth 3 (three) times daily. 270 tablet 1   EPINEPHrine (EPIPEN)  0.3 mg/0.3 mL DEVI Inject 0.3 mg into the muscle as needed.      esomeprazole (NEXIUM) 40 MG capsule Take 1 capsule (40 mg total) by mouth 2 (two) times daily. 180 capsule 3   ferrous sulfate 325 (65 FE) MG tablet Take 325 mg by mouth 2 (two) times daily.     FLOVENT HFA 220 MCG/ACT inhaler Inhale 1 puff into the lungs daily.   1   glimepiride (AMARYL) 1 MG tablet      Ibuprofen (ADVIL) 200 MG CAPS Take 2 capsules by mouth 3 (three) times daily as needed. For migraines     Interferon Beta-1b (BETASERON) 0.3 MG KIT injection INJECT 0.25 MG UNDER THE SKIN EVERY OTHER DAY AS DIRECTED 42 each 2   levothyroxine (SYNTHROID, LEVOTHROID) 100 MCG tablet TK 1 T PO QD  0   meclizine (ANTIVERT) 12.5 MG tablet TAKE 1 TABLET(12.5 MG) BY MOUTH THREE TIMES DAILY AS NEEDED FOR DIZZINESS 270 tablet 1   metoprolol succinate (TOPROL-XL) 50 MG 24 hr tablet TAKE 1 TABLET DAILY (PLEASE CALL AND SCHEDULE A ONE YEAR FOLLOW UP APPOINTMENT) 90 tablet 3   ONE TOUCH ULTRA TEST test strip USE TO CHECK FASTING AND PRE-DINNER BLOOD GLUCOSE BID  12   ONETOUCH DELICA LANCETS 45T MISC USE TO CHECK FASTING AND PRE-DINNER BLOOD GLUCOSE BID  12   potassium citrate (UROCIT-K) 10 MEQ (1080 MG) SR tablet Take 10 mEq by mouth 3 (three) times daily with meals.     pyridOXINE (VITAMIN B-6) 100 MG tablet Take 100 mg by mouth daily.     ranitidine (ZANTAC) 75 MG tablet Take 75 mg by mouth at bedtime.     rOPINIRole (REQUIP) 1 MG tablet Take 1 tablet (1 mg total) by mouth 4 (four) times daily. 360 tablet 3   sertraline (ZOLOFT) 100 MG tablet TAKE 1 AND 1/2 TABLETS BY  MOUTH DAILY 135 tablet 3   traMADol (ULTRAM) 50 MG tablet Take 1 tablet (50 mg total) by mouth 3 (three) times daily as needed. 90 tablet 3   traZODone (DESYREL) 100 MG tablet TAKE ONE-HALF TO ONE TABLET NIGHTLY FOR SLEEP 90 tablet 1   No current facility-administered medications on file prior to visit.     PAST MEDICAL HISTORY: Past Medical History:    Diagnosis Date   Allergy    Anxiety    Asthma    Back pain    Chronic kidney disease    KIDNEY STONES WITH STENTS   Depression    Diabetes ([facility])    Diverticula of colon    Dyspnea    Eczema    Food allergy    Gallbladder problem    GERD (gastroesophageal reflux disease)    Hypercholesterolemia    Hyperlipidemia    Hypothyroidism    Joint pain    Kidney stones    Leg edema    Migraine headache    Multiple sclerosis (HCC)    Obesity    Palpitations    PONV (postoperative nausea and vomiting)    Restless leg syndrome    Right sided  sciatica 10/29/2015   Snoring    Thyroid disease    hyperthyroidism   Unsteady gait    Vertigo    Vision abnormalities     PAST SURGICAL HISTORY: Past Surgical History:  Procedure Laterality Date   BREAST SURGERY     AUGMENTATION   carpel tunnel     CHOLECYSTECTOMY     DILATION AND CURETTAGE OF UTERUS  1983   FINGER SURGERY  x8   HERNIA REPAIR     LITHOTRIPSY     NISSEN FUNDOPLICATION     VENTRAL HERNIA REPAIR  07/25/2012   Procedure: LAPAROSCOPIC VENTRAL HERNIA;  Surgeon: Adin Hector, MD;  Location: [facility];  Service: General;  Laterality: N/A;    SOCIAL HISTORY: Social History  Substance Use Topics   Smoking status: Former Smoker   Smokeless tobacco: Never Used   Alcohol use No    FAMILY HISTORY: Family History  Problem Relation Age of Onset   Hyperlipidemia Mother    Glaucoma Mother    Hypertension Mother    Anxiety disorder Mother    Depression Mother    Heart disease Father    Cancer Father        prostate   Diabetes Father    Dementia Father    Hypertension Father    Hyperlipidemia Father    Sleep apnea Father    Obesity Father    Cancer Brother        esophageal   Arthritis Sister        RA    ROS: Review of Systems  Constitutional: Positive for malaise/fatigue.  HENT: Positive for congestion (nasal stuffiness) and sinus pain.        Dry  Mouth   Eyes:       Wear Glasses or Contacts  Respiratory: Positive for shortness of breath (with activity) and wheezing.   Cardiovascular: Positive for palpitations. Negative for chest pain, orthopnea and claudication.       Leg Cramping  Gastrointestinal: Positive for heartburn and nausea.  Musculoskeletal: Positive for back pain. Negative for myalgias.       Muscle or Joint Pain Muscle Stiffness  Skin: Positive for itching and rash.       dryness  Neurological: Positive for dizziness and headaches.  Endo/Heme/Allergies: Positive for polydipsia.       Negative Heat/Cold Intolerance  Psychiatric/Behavioral: Positive for depression.       Stress     PHYSICAL EXAM: Blood pressure 139/79, pulse 84, temperature 98.3 F (36.8 C), temperature source Oral, height _0  (1.448 m), weight 232 lb (105.2 kg), SpO2 95 %. Body mass index is 50.2 kg/m. Physical Exam  Constitutional: She is oriented to person, place, and time. She appears well-developed and well-nourished.  Cardiovascular: Normal rate.   Pulmonary/Chest: Effort normal.  Musculoskeletal: Normal range of motion.  Neurological: She is oriented to person, place, and time.  Skin: Skin is warm and dry.  Psychiatric: She has a normal mood and affect. Her behavior is normal.  Vitals reviewed.   RECENT LABS AND TESTS: BMET    Component Value Date/Time   NA 136 07/26/2012 0555   K 4.6 07/26/2012 0555   CL 103 07/26/2012 0555   CO2 26 07/26/2012 0555   GLUCOSE 127 (H) 07/26/2012 0555   BUN 11 07/26/2012 0555   CREATININE 0.65 07/26/2012 0555   CALCIUM 8.6 07/26/2012 0555   GFRNONAA >90 07/26/2012 0555   GFRAA >90 07/26/2012 0555   No results found for: HGBA1C No results  found for: INSULIN CBC    Component Value Date/Time   WBC 6.6 07/26/2012 0555   RBC 3.77 (L) 07/26/2012 0555   HGB 11.0 (L) 07/26/2012 0555   HGB 10.9 (L) 06/11/2009 1303   HCT 35.2 (L) 07/26/2012 0555   HCT 33.6 (L) 06/11/2009 1303   PLT 192  07/26/2012 0555   PLT 259 06/11/2009 1303   MCV 93.4 07/26/2012 0555   MCV 78.3 (L) 06/11/2009 1303   MCH 29.2 07/26/2012 0555   MCHC 31.3 07/26/2012 0555   RDW 14.0 07/26/2012 0555   RDW 16.3 (H) 06/11/2009 1303   LYMPHSABS 1.3 07/20/2012 0848   LYMPHSABS 1.3 06/11/2009 1303   MONOABS 0.6 07/20/2012 0848   MONOABS 0.7 06/11/2009 1303   EOSABS 0.2 07/20/2012 0848   EOSABS 0.2 06/11/2009 1303   BASOSABS 0.0 07/20/2012 0848   BASOSABS 0.0 06/11/2009 1303   Iron/TIBC/Ferritin/ %Sat    Component Value Date/Time   IRON 31 (L) 06/11/2009 1303   TIBC 420 06/11/2009 1303   FERRITIN 9 (L) 06/11/2009 1303   IRONPCTSAT 7 (L) 06/11/2009 1303   Lipid Panel  No results found for: CHOL, TRIG, HDL, CHOLHDL, VLDL, LDLCALC, LDLDIRECT Hepatic Function Panel     Component Value Date/Time   PROT 7.2 07/20/2012 0848   ALBUMIN 3.8 07/20/2012 0848   AST 17 07/20/2012 0848   ALT 17 07/20/2012 0848   ALKPHOS 86 07/20/2012 0848   BILITOT 0.2 (L) 07/20/2012 0848   No results found for: TSH  ECG  shows NSR with a rate of 82 BPM INDIRECT CALORIMETER done today shows a VO2 of 253 and a REE of 1763. Her calculated basal metabolic rate is 0321 thus her basal metabolic rate is better than expected.    ASSESSMENT AND PLAN: Other fatigue - Plan: EKG 12-Lead, CBC with Differential/Platelet, VITAMIN D 25 Hydroxy (Vit-D Deficiency, Fractures)  Shortness of breath on exertion  Type 2 diabetes mellitus without complication, without long-term current use of insulin ([place]) - Plan: Comprehensive metabolic panel, Hemoglobin A1c, Insulin, random, Microalbumin / creatinine urine ratio  Other specified hypothyroidism - Plan: T3, T4, free, TSH  Other hyperlipidemia - Plan: Lipid Panel With LDL/HDL Ratio  MS (multiple sclerosis) (HCC)  Depression screening  Class 3 obesity with serious comorbidity and body mass index (BMI) of 50.0 to 59.9 in adult, unspecified obesity type (HCC)  PLAN:  Fatigue Shamica  was informed that her fatigue may be related to obesity, depression or many other causes. Labs will be ordered, and in the meanwhile Ferol has agreed to work on diet, exercise and weight loss to help with fatigue. Proper sleep hygiene was discussed including the need for 7-8 hours of quality sleep each night. A sleep study was not ordered based on symptoms and Epworth score.  Dyspnea on exertion Yamari's shortness of breath appears to be obesity related and exercise induced. She has agreed to work on weight loss and gradually increase exercise to treat her exercise induced shortness of breath. If Kashish follows our instructions and loses weight without improvement of her shortness of breath, we will plan to refer to pulmonology. We will monitor this condition regularly. Cayli agrees to this plan.  Diabetes II Dama has been given extensive diabetes education by myself today including ideal fasting and post-prandial blood glucose readings, individual ideal Hgb A1c goals  and hypoglycemia prevention. We discussed the importance of good blood sugar control to decrease the likelihood of diabetic complications such as nephropathy, neuropathy, limb loss, blindness, coronary artery  disease, and death. We discussed the importance of intensive lifestyle modification including diet, exercise and weight loss as the first line treatment for diabetes. Cedricka agrees to continue her diabetes medications, diet and exercise and we will check labs. She agreed to follow up at the agreed upon time.  Hypothyroid Alicia was informed of the importance of good thyroid control to help with weight loss efforts. She was also informed that supertheraputic thyroid levels are dangerous and will not improve weight loss results. We will check labs and Nishika agreed to follow up with our clinic in 2 weeks.  Hyperlipidemia Dayzha was informed of the American Heart Association Guidelines emphasizing intensive lifestyle modifications  as the first line treatment for hyperlipidemia. We discussed many lifestyle modifications today in depth, and Briannon will continue to work on decreasing saturated fats such as fatty red meat, butter and many fried foods. She will also increase vegetables and lean protein in her diet and continue to work on exercise and weight loss efforts. We will check labs and Daleigh agrees to follow up with our clinic in 2 weeks.  Multiple Sclerosis We will factor this in as we start to exercise with the goal to improve mobility and balance. Prudie agrees to work on weight loss to help decrease symptoms and will follow up as directed.  Depression Screen Hollye had a strongly positive depression screening. Depression is commonly associated with obesity and often results in emotional eating behaviors. We will monitor this closely and work on CBT to help improve the non-hunger eating patterns. Referral to Psychology may be required if no improvement is seen as she continues in our clinic.  Obesity Nahal is currently in the action stage of change and her goal is to continue with weight loss efforts She has agreed to follow the Category 2 plan +100 calories Zenaya has been instructed to work up to a goal of 150 minutes of combined cardio and strengthening exercise per week for weight loss and overall health benefits. We discussed the following Behavioral Modification Strategies today: increasing lean protein intake and meal planning & cooking strategies  Braley has agreed to follow up with our clinic in 2 weeks. She was informed of the importance of frequent follow up visits to maximize her success with intensive lifestyle modifications for her multiple health conditions. She was informed we would discuss her lab results at her next visit unless there is a critical issue that needs to be addressed sooner. Britany agreed to keep her next visit at the agreed upon time to discuss these results.  I, Doreene Nest, am  acting as transcriptionist for Dennard Nip, MD  I have reviewed the above documentation for accuracy and completeness, and I agree with the above. -Dennard Nip, MD  OBESITY BEHAVIORAL INTERVENTION VISIT  Today's visit was # 1 out of 78.  Starting weight: 232 lbs Starting date: 05/10/17 Today's weight : 232 lbs Today's date: 05/10/2017 Total lbs lost to date: 0 (Patients must lose 7 lbs in the first 6 months to continue with counseling)   ASK: We discussed the diagnosis of obesity with Rosamaria Lints today and Asuncion agreed to give [place] permission to discuss obesity behavioral modification therapy today.  ASSESS: Deanne has the diagnosis of obesity and her BMI today is 50.3 Lalanya is in the action stage of change   ADVISE: Nori was educated on the multiple health risks of obesity as well as the benefit of weight loss to improve her health. She was advised of the  need for long term treatment and the importance of lifestyle modifications.  AGREE: Multiple dietary modification options and treatment options were discussed and  Laurine agreed to follow the Category 2 plan +100 calories We discussed the following Behavioral Modification Strategies today: increasing lean protein intake and meal planning & cooking strategies

## 2017-05-11 LAB — CBC WITH DIFFERENTIAL/PLATELET
BASOS ABS: 0 10*3/uL (ref 0.0–0.2)
BASOS: 0 %
EOS (ABSOLUTE): 0.2 10*3/uL (ref 0.0–0.4)
Eos: 3 %
Hematocrit: 40.9 % (ref 34.0–46.6)
Hemoglobin: 12.8 g/dL (ref 11.1–15.9)
Immature Grans (Abs): 0.1 10*3/uL (ref 0.0–0.1)
Immature Granulocytes: 1 %
Lymphocytes Absolute: 1.3 10*3/uL (ref 0.7–3.1)
Lymphs: 21 %
MCH: 27.9 pg (ref 26.6–33.0)
MCHC: 31.3 g/dL — ABNORMAL LOW (ref 31.5–35.7)
MCV: 89 fL (ref 79–97)
MONOS ABS: 0.5 10*3/uL (ref 0.1–0.9)
Monocytes: 8 %
NEUTROS ABS: 4.1 10*3/uL (ref 1.4–7.0)
NEUTROS PCT: 67 %
Platelets: 232 10*3/uL (ref 150–379)
RBC: 4.58 x10E6/uL (ref 3.77–5.28)
RDW: 14.6 % (ref 12.3–15.4)
WBC: 6.2 10*3/uL (ref 3.4–10.8)

## 2017-05-11 LAB — COMPREHENSIVE METABOLIC PANEL
ALK PHOS: 104 IU/L (ref 39–117)
ALT: 17 IU/L (ref 0–32)
AST: 15 IU/L (ref 0–40)
Albumin/Globulin Ratio: 1.7 (ref 1.2–2.2)
Albumin: 4 g/dL (ref 3.6–4.8)
BUN/Creatinine Ratio: 21 (ref 12–28)
BUN: 14 mg/dL (ref 8–27)
Bilirubin Total: 0.3 mg/dL (ref 0.0–1.2)
CHLORIDE: 103 mmol/L (ref 96–106)
CO2: 26 mmol/L (ref 20–29)
Calcium: 9.2 mg/dL (ref 8.7–10.3)
Creatinine, Ser: 0.68 mg/dL (ref 0.57–1.00)
GFR, EST AFRICAN AMERICAN: 106 mL/min/{1.73_m2} (ref >59)
GFR, EST NON AFRICAN AMERICAN: 92 mL/min/{1.73_m2} (ref >59)
GLUCOSE: 138 mg/dL — AB (ref 65–99)
Globulin, Total: 2.4 g/dL (ref 1.5–4.5)
POTASSIUM: 4.6 mmol/L (ref 3.5–5.2)
SODIUM: 144 mmol/L (ref 134–144)
Total Protein: 6.4 g/dL (ref 6.0–8.5)

## 2017-05-11 LAB — MICROALBUMIN / CREATININE URINE RATIO
CREATININE, UR: 102.6 mg/dL
Microalb/Creat Ratio: 7.9 mg/g creat (ref 0.0–30.0)
Microalbumin, Urine: 8.1 ug/mL

## 2017-05-11 LAB — T3: T3, Total: 98 ng/dL (ref 71–180)

## 2017-05-11 LAB — HEMOGLOBIN A1C
Est. average glucose Bld gHb Est-mCnc: 169 mg/dL
HEMOGLOBIN A1C: 7.5 % — AB (ref 4.8–5.6)

## 2017-05-11 LAB — LIPID PANEL WITH LDL/HDL RATIO
Cholesterol, Total: 178 mg/dL (ref 100–199)
HDL: 50 mg/dL (ref >39)
LDL CALC: 98 mg/dL (ref 0–99)
LDL/HDL RATIO: 2 ratio (ref 0.0–3.2)
TRIGLYCERIDES: 151 mg/dL — AB (ref 0–149)
VLDL Cholesterol Cal: 30 mg/dL (ref 5–40)

## 2017-05-11 LAB — VITAMIN D 25 HYDROXY (VIT D DEFICIENCY, FRACTURES): Vit D, 25-Hydroxy: 23.6 ng/mL — ABNORMAL LOW (ref 30.0–100.0)

## 2017-05-11 LAB — TSH: TSH: 2.67 u[IU]/mL (ref 0.450–4.500)

## 2017-05-11 LAB — T4, FREE: FREE T4: 1.33 ng/dL (ref 0.82–1.77)

## 2017-05-11 LAB — INSULIN, RANDOM: INSULIN: 49.2 u[IU]/mL — ABNORMAL HIGH (ref 2.6–24.9)

## 2017-05-24 ENCOUNTER — Ambulatory Visit (INDEPENDENT_AMBULATORY_CARE_PROVIDER_SITE_OTHER): Payer: Medicare Other | Admitting: Family Medicine

## 2017-05-24 ENCOUNTER — Other Ambulatory Visit: Payer: Self-pay | Admitting: Neurology

## 2017-05-24 VITALS — BP 108/72 | HR 78 | Temp 98.5°F | Ht <= 58 in | Wt 224.0 lb

## 2017-05-24 DIAGNOSIS — E11649 Type 2 diabetes mellitus with hypoglycemia without coma: Secondary | ICD-10-CM | POA: Diagnosis not present

## 2017-05-24 DIAGNOSIS — E669 Obesity, unspecified: Secondary | ICD-10-CM

## 2017-05-24 DIAGNOSIS — Z6841 Body Mass Index (BMI) 40.0 and over, adult: Secondary | ICD-10-CM | POA: Diagnosis not present

## 2017-05-24 DIAGNOSIS — IMO0001 Reserved for inherently not codable concepts without codable children: Secondary | ICD-10-CM

## 2017-05-24 DIAGNOSIS — E559 Vitamin D deficiency, unspecified: Secondary | ICD-10-CM

## 2017-05-24 MED ORDER — VITAMIN D (ERGOCALCIFEROL) 1.25 MG (50000 UNIT) PO CAPS: 50000.0000 [IU] | ORAL_CAPSULE | ORAL | 0 refills | Status: DC

## 2017-05-24 MED ORDER — METFORMIN HCL 500 MG PO TABS: 500.0000 mg | ORAL_TABLET | Freq: Every day | ORAL | 0 refills | Status: DC

## 2017-05-24 NOTE — Progress Notes (Signed)
Office: [phone]  /  Fax: [phone]   HPI:   Chief Complaint: OBESITY Jocelyn Wilson is here to discuss her progress with her obesity treatment plan. She is on the Category 2 plan +100 calories and is following her eating plan approximately 100 % of the time. She states she is exercising 0 minutes 0 times per week. Jocelyn Wilson has done very well with weight loss but has some celebration eating coming up for kids birthday. Her weight is 224 lb (101.6 kg) today and has had a weight loss of 8 pounds over a period of 2 weeks since her last visit. She has lost 8 lbs since starting treatment with [place].  Vitamin D deficiency Jocelyn Wilson has a diagnosis of vitamin D deficiency. She is not currently taking vit D and not yet at goal. She hasn't been on vit D in the past. Jocelyn Wilson admits fatigue and denies nausea, vomiting or muscle weakness.  Diabetes II Jocelyn Wilson has a diagnosis of diabetes type II. Jocelyn Wilson fasting BGs dropped from 180's and 190's to 90 and 105 and felt some hypoglycemia when she was approximately 100. She is on glimepiride and last A1c was elevated at 7.5  She has been working on intensive lifestyle modifications including diet, exercise, and weight loss to help control her blood glucose levels.   ALLERGIES: Allergies  Allergen Reactions  . Iron Anaphylaxis    Iv iron  . Ciprofloxacin Nausea Only  . Contrast Media [Iodinated Diagnostic Agents] Itching and Rash  . Sulfa Antibiotics Itching and Rash  . Tape Rash    Adhesive tape    MEDICATIONS: Current Outpatient Prescriptions on File Prior to Visit  Medication Sig Dispense Refill  . atorvastatin (LIPITOR) 20 MG tablet Take 20 mg by mouth daily.    . busPIRone (BUSPAR) 30 MG tablet Take 1 tablet (30 mg total) by mouth 3 (three) times daily. 270 tablet 1  . EPINEPHrine (EPIPEN) 0.3 mg/0.3 mL DEVI Inject 0.3 mg into the muscle as needed.     Marland Kitchen esomeprazole (NEXIUM) 40 MG capsule Take 1 capsule (40 mg total) by mouth 2 (two) times daily.  180 capsule 3  . ferrous sulfate 325 (65 FE) MG tablet Take 325 mg by mouth 2 (two) times daily.    Marland Kitchen FLOVENT HFA 220 MCG/ACT inhaler Inhale 1 puff into the lungs daily.   1  . Ibuprofen (ADVIL) 200 MG CAPS Take 2 capsules by mouth 3 (three) times daily as needed. For migraines    . Interferon Beta-1b (BETASERON) 0.3 MG KIT injection INJECT 0.25 MG UNDER THE SKIN EVERY OTHER DAY AS DIRECTED 42 each 2  . levothyroxine (SYNTHROID, LEVOTHROID) 100 MCG tablet TK 1 T PO QD  0  . meclizine (ANTIVERT) 12.5 MG tablet TAKE 1 TABLET(12.5 MG) BY MOUTH THREE TIMES DAILY AS NEEDED FOR DIZZINESS 270 tablet 1  . ONE TOUCH ULTRA TEST test strip USE TO CHECK FASTING AND PRE-DINNER BLOOD GLUCOSE BID  12  . ONETOUCH DELICA LANCETS 53G MISC USE TO CHECK FASTING AND PRE-DINNER BLOOD GLUCOSE BID  12  . potassium citrate (UROCIT-K) 10 MEQ (1080 MG) SR tablet Take 10 mEq by mouth 3 (three) times daily with meals.    . pyridOXINE (VITAMIN B-6) 100 MG tablet Take 100 mg by mouth daily.    . ranitidine (ZANTAC) 75 MG tablet Take 75 mg by mouth at bedtime.    Marland Kitchen rOPINIRole (REQUIP) 1 MG tablet Take 1 tablet (1 mg total) by mouth 4 (four) times daily. 360 tablet  3  . sertraline (ZOLOFT) 100 MG tablet TAKE 1 AND 1/2 TABLETS BY  MOUTH DAILY 135 tablet 3  . traMADol (ULTRAM) 50 MG tablet Take 1 tablet (50 mg total) by mouth 3 (three) times daily as needed. 90 tablet 3  . traZODone (DESYREL) 100 MG tablet TAKE ONE-HALF TO ONE TABLET NIGHTLY FOR SLEEP 90 tablet 1   No current facility-administered medications on file prior to visit.     PAST MEDICAL HISTORY: Past Medical History:  Diagnosis Date  . Allergy   . Anxiety   . Asthma   . Back pain   . Chronic kidney disease    KIDNEY STONES WITH STENTS  . Depression   . Diabetes ([place])   . Diverticula of colon   . Dyspnea   . Eczema   . Food allergy   . Gallbladder problem   . GERD (gastroesophageal reflux disease)   . Hypercholesterolemia   . Hyperlipidemia   .  Hypothyroidism   . Joint pain   . Kidney stones   . Leg edema   . Migraine headache   . Multiple sclerosis ([place])   . Obesity   . Palpitations   . PONV (postoperative nausea and vomiting)   . Restless leg syndrome   . Right sided sciatica 10/29/2015  . Snoring   . Thyroid disease    hyperthyroidism  . Unsteady gait   . Vertigo   . Vision abnormalities     PAST SURGICAL HISTORY: Past Surgical History:  Procedure Laterality Date  . BREAST SURGERY     AUGMENTATION  . carpel tunnel    . CHOLECYSTECTOMY    . DILATION AND CURETTAGE OF UTERUS  1983  . FINGER SURGERY  x8  . HERNIA REPAIR    . LITHOTRIPSY    . NISSEN FUNDOPLICATION    . VENTRAL HERNIA REPAIR  07/25/2012   Procedure: LAPAROSCOPIC VENTRAL HERNIA;  Surgeon: Adin Hector, MD;  Location: [facility];  Service: General;  Laterality: N/A;    SOCIAL HISTORY: Social History  Substance Use Topics  . Smoking status: Former Research scientist (life sciences)  . Smokeless tobacco: Never Used  . Alcohol use No    FAMILY HISTORY: Family History  Problem Relation Age of Onset  . Hyperlipidemia Mother   . Glaucoma Mother   . Hypertension Mother   . Anxiety disorder Mother   . Depression Mother   . Heart disease Father   . Cancer Father        prostate  . Diabetes Father   . Dementia Father   . Hypertension Father   . Hyperlipidemia Father   . Sleep apnea Father   . Obesity Father   . Cancer Brother        esophageal  . Arthritis Sister        RA    ROS: Review of Systems  Constitutional: Positive for malaise/fatigue and weight loss.  Gastrointestinal: Negative for nausea and vomiting.  Musculoskeletal:       Negative muscle weakness  Endo/Heme/Allergies:       Hypoglycemia    PHYSICAL EXAM: Blood pressure 108/72, pulse 78, temperature 98.5 F (36.9 C), temperature source Oral, height 4' 9" (1.448 m), weight 224 lb (101.6 kg), SpO2 95 %. Body mass index is 48.47 kg/m. Physical Exam  Constitutional: She is oriented to person,  place, and time. She appears well-developed and well-nourished.  Cardiovascular: Normal rate.   Pulmonary/Chest: Effort normal.  Musculoskeletal: Normal range of motion.  Neurological: She is oriented to  person, place, and time.  Skin: Skin is warm and dry.  Psychiatric: She has a normal mood and affect. Her behavior is normal.  Vitals reviewed.   RECENT LABS AND TESTS: BMET    Component Value Date/Time   NA 144 05/10/2017 1138   K 4.6 05/10/2017 1138   CL 103 05/10/2017 1138   CO2 26 05/10/2017 1138   GLUCOSE 138 (H) 05/10/2017 1138   GLUCOSE 127 (H) 07/26/2012 0555   BUN 14 05/10/2017 1138   CREATININE 0.68 05/10/2017 1138   CALCIUM 9.2 05/10/2017 1138   GFRNONAA 92 05/10/2017 1138   GFRAA 106 05/10/2017 1138   Lab Results  Component Value Date   HGBA1C 7.5 (H) 05/10/2017   Lab Results  Component Value Date   INSULIN 49.2 (H) 05/10/2017   CBC    Component Value Date/Time   WBC 6.2 05/10/2017 1138   WBC 6.6 07/26/2012 0555   RBC 4.58 05/10/2017 1138   RBC 3.77 (L) 07/26/2012 0555   HGB 12.8 05/10/2017 1138   HGB 10.9 (L) 06/11/2009 1303   HCT 40.9 05/10/2017 1138   HCT 33.6 (L) 06/11/2009 1303   PLT 232 05/10/2017 1138   MCV 89 05/10/2017 1138   MCV 78.3 (L) 06/11/2009 1303   MCH 27.9 05/10/2017 1138   MCH 29.2 07/26/2012 0555   MCHC 31.3 (L) 05/10/2017 1138   MCHC 31.3 07/26/2012 0555   RDW 14.6 05/10/2017 1138   RDW 16.3 (H) 06/11/2009 1303   LYMPHSABS 1.3 05/10/2017 1138   LYMPHSABS 1.3 06/11/2009 1303   MONOABS 0.6 07/20/2012 0848   MONOABS 0.7 06/11/2009 1303   EOSABS 0.2 05/10/2017 1138   BASOSABS 0.0 05/10/2017 1138   BASOSABS 0.0 06/11/2009 1303   Iron/TIBC/Ferritin/ %Sat    Component Value Date/Time   IRON 31 (L) 06/11/2009 1303   TIBC 420 06/11/2009 1303   FERRITIN 9 (L) 06/11/2009 1303   IRONPCTSAT 7 (L) 06/11/2009 1303   Lipid Panel     Component Value Date/Time   CHOL 178 05/10/2017 1138   TRIG 151 (H) 05/10/2017 1138   HDL 50  05/10/2017 1138   LDLCALC 98 05/10/2017 1138   Hepatic Function Panel     Component Value Date/Time   PROT 6.4 05/10/2017 1138   ALBUMIN 4.0 05/10/2017 1138   AST 15 05/10/2017 1138   ALT 17 05/10/2017 1138   ALKPHOS 104 05/10/2017 1138   BILITOT 0.3 05/10/2017 1138      Component Value Date/Time   TSH 2.670 05/10/2017 1138    ASSESSMENT AND PLAN: Type 2 diabetes mellitus with hypoglycemia without coma, without long-term current use of insulin ([place]) - Plan: metFORMIN (GLUCOPHAGE) 500 MG tablet  Vitamin D deficiency - Plan: Vitamin D, Ergocalciferol, (DRISDOL) 50000 units CAPS capsule  Class 3 obesity with serious comorbidity and body mass index (BMI) of 45.0 to 49.9 in adult, unspecified obesity type ([place])  PLAN:  Vitamin D Deficiency Jocelyn Wilson was informed that low vitamin D levels contributes to fatigue and are associated with obesity, breast, and colon cancer. She agrees to start to take prescription Vit D _0 ,000 IU every week #4 with no refills and will follow up for routine testing of vitamin D, at least 2-3 times per year. She was informed of the risk of over-replacement of vitamin D and agrees to not increase her dose unless he discusses this with [place] first. Jocelyn Wilson agrees to follow up with our clinic in 2 weeks.  Diabetes II Jocelyn Wilson has been given extensive diabetes education  by myself today including ideal fasting and post-prandial blood glucose readings, individual ideal Hgb A1c goals  and hypoglycemia prevention. We discussed the importance of good blood sugar control to decrease the likelihood of diabetic complications such as nephropathy, neuropathy, limb loss, blindness, coronary artery disease, and death. We discussed the importance of intensive lifestyle modification including diet, exercise and weight loss as the first line treatment for diabetes. Jocelyn Wilson agrees to discontinue glimepiride and start metformin 500 mg every morning #30 with no refills and will follow up at the  agreed upon time.  Obesity Jocelyn Wilson is currently in the action stage of change. As such, her goal is to continue with weight loss efforts She has agreed to follow the Category 2 plan Jocelyn Wilson has been instructed to work up to a goal of 150 minutes of combined cardio and strengthening exercise per week for weight loss and overall health benefits. We discussed the following Behavioral Modification Strategies today: no skipping meals, increasing lean protein intake and decreasing simple carbohydrates   Jocelyn Wilson has agreed to follow up with our clinic in 2 weeks. She was informed of the importance of frequent follow up visits to maximize her success with intensive lifestyle modifications for her multiple health conditions.  I, Doreene Nest, am acting as transcriptionist for Dennard Nip, MD  I have reviewed the above documentation for accuracy and completeness, and I agree with the above. -Dennard Nip, MD   OBESITY BEHAVIORAL INTERVENTION VISIT  Today's visit was # 2 out of 85.  Starting weight: 232 lbs Starting date: 05/10/17 Today's weight : 224 lbs Today's date: 05/24/2017 Total lbs lost to date: 8 (Patients must lose 7 lbs in the first 6 months to continue with counseling)   ASK: We discussed the diagnosis of obesity with Jocelyn Wilson today and Jocelyn Wilson agreed to give [place] permission to discuss obesity behavioral modification therapy today.  ASSESS: Jocelyn Wilson has the diagnosis of obesity and her BMI today is 48.6 Jocelyn Wilson is in the action stage of change   ADVISE: Jocelyn Wilson was educated on the multiple health risks of obesity as well as the benefit of weight loss to improve her health. She was advised of the need for long term treatment and the importance of lifestyle modifications.  AGREE: Multiple dietary modification options and treatment options were discussed and  Jocelyn Wilson agreed to follow the Category 2 plan We discussed the following Behavioral Modification Strategies today: no  skipping meals, increasing lean protein intake and decreasing simple carbohydrates

## 2017-05-25 NOTE — Progress Notes (Signed)
.   Hemoglobin A1C Latest Ref Rng & Units 05/10/2017  HGBA1C 4.8 - 5.6 % 7.5(H)  Some recent data might be hidden    She has been working on intensive lifestyle modifications including diet, exercise, and weight loss to help control her blood glucose levels.  Patient was educated about food nutrients i.e. protein, fats, simple and complex carbohydrates and how these affect insulin response. Focus on portion control,  avoiding simple carbohydrates and lower fat foods for ongoing wt loss efforts and glucose management.  Jocelyn Wilson is on the following meal plan category 2. Her meal plan was individualized for maximum benefit.  Also discussed at length the following behavioral modifications to help maximize Jocelyn Wilson's success: increasing lean protein intake, decreasing simple carbohydrates, increasing non-starchy vegetables. meal planning and cooking strategies, keeping healthy foods in the home.   Jocelyn Wilson has been instructed to work up to a goal of 150 minutes of combined cardio and strengthening exercise per week for weight loss and overall health benefits. Written information was provided and the following handouts were given:  100 calorie snack ideas, smart fruits, category 2 meal plan. Jocelyn Wilson agrees to the above and will follow up with our clinic in 2 weeks.   Office: [phone]  /  Fax: [phone]  OBESITY BEHAVIORAL INTERVENTION VISIT

## 2017-05-26 ENCOUNTER — Other Ambulatory Visit: Payer: Self-pay | Admitting: Neurology

## 2017-06-03 ENCOUNTER — Other Ambulatory Visit: Payer: Self-pay | Admitting: Neurology

## 2017-06-08 ENCOUNTER — Ambulatory Visit (INDEPENDENT_AMBULATORY_CARE_PROVIDER_SITE_OTHER): Payer: Medicare Other | Admitting: Family Medicine

## 2017-06-08 VITALS — BP 112/70 | HR 73 | Temp 97.9°F | Ht <= 58 in | Wt 221.0 lb

## 2017-06-08 DIAGNOSIS — E119 Type 2 diabetes mellitus without complications: Secondary | ICD-10-CM | POA: Diagnosis not present

## 2017-06-08 DIAGNOSIS — Z9189 Other specified personal risk factors, not elsewhere classified: Secondary | ICD-10-CM | POA: Diagnosis not present

## 2017-06-08 DIAGNOSIS — IMO0001 Reserved for inherently not codable concepts without codable children: Secondary | ICD-10-CM

## 2017-06-08 DIAGNOSIS — Z6841 Body Mass Index (BMI) 40.0 and over, adult: Secondary | ICD-10-CM

## 2017-06-08 DIAGNOSIS — E669 Obesity, unspecified: Secondary | ICD-10-CM

## 2017-06-09 NOTE — Progress Notes (Signed)
Office: [phone]  /  Fax: [phone]   HPI:   Chief Complaint: OBESITY Jocelyn Wilson is here to discuss her progress with her obesity treatment plan. She is on the Category 2 plan and is following her eating plan approximately 98 % of the time. She states she is exercising 0 minutes 0 times per week. Jocelyn Wilson continues to do well with weight loss and has had some celebration eating and has been eating out. She states that she is getting bored with breakfast.  Her weight is 221 lb (100.2 kg) today and has had a weight loss of 3 pounds over a period of 2 weeks since her last visit. She has lost 11 lbs since starting treatment with [place].  Diabetes II Jocelyn Wilson has a diagnosis of diabetes type II. Jocelyn Wilson states fasting BGs range approximately between 129-149 an 2 hour post prandial range between 104-181 (mostly 140-150's) on metformin and she denies nausea, vomiting, or any hypoglycemic episodes. Last A1c was Hemoglobin A1C Latest Ref Rng & Units 05/10/2017  HGBA1C 4.8 - 5.6 % 7.5(H)  Some recent data might be hidden    She has been working on intensive lifestyle modifications including diet, exercise, and weight loss to help control her blood glucose levels.  At risk for cardiovascular disease Jocelyn Wilson is at a higher than average risk for cardiovascular disease due to obesity and diabetes. She currently denies any chest pain.  ALLERGIES: Allergies  Allergen Reactions  . Iron Anaphylaxis    Iv iron  . Ciprofloxacin Nausea Only  . Contrast Media [Iodinated Diagnostic Agents] Itching and Rash  . Sulfa Antibiotics Itching and Rash  . Tape Rash    Adhesive tape    MEDICATIONS: Current Outpatient Prescriptions on File Prior to Visit  Medication Sig Dispense Refill  . atorvastatin (LIPITOR) 20 MG tablet Take 20 mg by mouth daily.    . busPIRone (BUSPAR) 30 MG tablet TAKE 1 TABLET BY MOUTH 3  TIMES DAILY 270 tablet 1  . esomeprazole (NEXIUM) 40 MG capsule TAKE 1 CAPSULE BY MOUTH TWO TIMES DAILY  180 capsule 1  . ferrous sulfate 325 (65 FE) MG tablet Take 325 mg by mouth 2 (two) times daily.    Marland Kitchen FLOVENT HFA 220 MCG/ACT inhaler Inhale 1 puff into the lungs daily.   1  . Ibuprofen (ADVIL) 200 MG CAPS Take 2 capsules by mouth 3 (three) times daily as needed. For migraines    . Interferon Beta-1b (BETASERON) 0.3 MG KIT injection INJECT 0.25 MG UNDER THE SKIN EVERY OTHER DAY AS DIRECTED 42 each 2  . levothyroxine (SYNTHROID, LEVOTHROID) 100 MCG tablet TK 1 T PO QD  0  . meclizine (ANTIVERT) 12.5 MG tablet TAKE 1 TABLET(12.5 MG) BY MOUTH THREE TIMES DAILY AS NEEDED FOR DIZZINESS 270 tablet 1  . metFORMIN (GLUCOPHAGE) 500 MG tablet Take 1 tablet (500 mg total) by mouth daily with breakfast. 30 tablet 0  . metoprolol succinate (TOPROL-XL) 50 MG 24 hr tablet TAKE 1 TABLET BY MOUTH  DAILY 90 tablet 3  . ONE TOUCH ULTRA TEST test strip USE TO CHECK FASTING AND PRE-DINNER BLOOD GLUCOSE BID  12  . ONETOUCH DELICA LANCETS 55D MISC USE TO CHECK FASTING AND PRE-DINNER BLOOD GLUCOSE BID  12  . potassium citrate (UROCIT-K) 10 MEQ (1080 MG) SR tablet Take 10 mEq by mouth 3 (three) times daily with meals.    . ranitidine (ZANTAC) 75 MG tablet Take 75 mg by mouth at bedtime.    Marland Kitchen rOPINIRole (REQUIP) 1  MG tablet Take 1 tablet (1 mg total) by mouth 4 (four) times daily. 360 tablet 3  . sertraline (ZOLOFT) 100 MG tablet TAKE 1 AND 1/2 TABLETS BY  MOUTH DAILY 135 tablet 3  . traMADol (ULTRAM) 50 MG tablet Take 1 tablet (50 mg total) by mouth 3 (three) times daily as needed. 90 tablet 3  . traZODone (DESYREL) 100 MG tablet TAKE 1/2 TO 1 TABLET BY  MOUTH NIGHTLY FOR SLEEP 90 tablet 1  . Vitamin D, Ergocalciferol, (DRISDOL) 50000 units CAPS capsule Take 1 capsule (50,000 Units total) by mouth every 7 (seven) days. 4 capsule 0   No current facility-administered medications on file prior to visit.     PAST MEDICAL HISTORY: Past Medical History:  Diagnosis Date  . Allergy   . Anxiety   . Asthma   . Back pain     . Chronic kidney disease    KIDNEY STONES WITH STENTS  . Depression   . Diabetes ([place])   . Diverticula of colon   . Dyspnea   . Eczema   . Food allergy   . Gallbladder problem   . GERD (gastroesophageal reflux disease)   . Hypercholesterolemia   . Hyperlipidemia   . Hypothyroidism   . Joint pain   . Kidney stones   . Leg edema   . Migraine headache   . Multiple sclerosis ([place])   . Obesity   . Palpitations   . PONV (postoperative nausea and vomiting)   . Restless leg syndrome   . Right sided sciatica 10/29/2015  . Snoring   . Thyroid disease    hyperthyroidism  . Unsteady gait   . Vertigo   . Vision abnormalities     PAST SURGICAL HISTORY: Past Surgical History:  Procedure Laterality Date  . BREAST SURGERY     AUGMENTATION  . carpel tunnel    . CHOLECYSTECTOMY    . DILATION AND CURETTAGE OF UTERUS  1983  . FINGER SURGERY  x8  . HERNIA REPAIR    . LITHOTRIPSY    . NISSEN FUNDOPLICATION    . VENTRAL HERNIA REPAIR  07/25/2012   Procedure: LAPAROSCOPIC VENTRAL HERNIA;  Surgeon: Adin Hector, MD;  Location: [facility];  Service: General;  Laterality: N/A;    SOCIAL HISTORY: Social History  Substance Use Topics  . Smoking status: Former Research scientist (life sciences)  . Smokeless tobacco: Never Used  . Alcohol use No    FAMILY HISTORY: Family History  Problem Relation Age of Onset  . Hyperlipidemia Mother   . Glaucoma Mother   . Hypertension Mother   . Anxiety disorder Mother   . Depression Mother   . Heart disease Father   . Cancer Father        prostate  . Diabetes Father   . Dementia Father   . Hypertension Father   . Hyperlipidemia Father   . Sleep apnea Father   . Obesity Father   . Cancer Brother        esophageal  . Arthritis Sister        RA    ROS: Review of Systems  Constitutional: Positive for weight loss.  Cardiovascular: Negative for chest pain.  Gastrointestinal: Negative for nausea and vomiting.  Endo/Heme/Allergies:       Negative hypoglycemia     PHYSICAL EXAM: Blood pressure 112/70, pulse 73, temperature 97.9 F (36.6 C), temperature source Oral, height _0  (1.448 m), weight 221 lb (100.2 kg), SpO2 97 %. Body mass index is 47.Bruno  kg/m. Physical Exam  Constitutional: She is oriented to person, place, and time. She appears well-developed and well-nourished.  Cardiovascular: Normal rate.   Pulmonary/Chest: Effort normal.  Musculoskeletal: Normal range of motion.  Neurological: She is oriented to person, place, and time.  Skin: Skin is warm and dry.  Psychiatric: She has a normal mood and affect. Her behavior is normal.  Vitals Wilson.   RECENT LABS AND TESTS: BMET    Component Value Date/Time   NA 144 05/10/2017 1138   K 4.6 05/10/2017 1138   CL 103 05/10/2017 1138   CO2 26 05/10/2017 1138   GLUCOSE 138 (H) 05/10/2017 1138   GLUCOSE 127 (H) 07/26/2012 0555   BUN 14 05/10/2017 1138   CREATININE 0.68 05/10/2017 1138   CALCIUM 9.2 05/10/2017 1138   GFRNONAA 92 05/10/2017 1138   GFRAA 106 05/10/2017 1138   Lab Results  Component Value Date   HGBA1C 7.5 (H) 05/10/2017   Lab Results  Component Value Date   INSULIN 49.2 (H) 05/10/2017   CBC    Component Value Date/Time   WBC 6.2 05/10/2017 1138   WBC 6.6 07/26/2012 0555   RBC 4.58 05/10/2017 1138   RBC 3.77 (L) 07/26/2012 0555   HGB 12.8 05/10/2017 1138   HGB 10.9 (L) 06/11/2009 1303   HCT 40.9 05/10/2017 1138   HCT 33.6 (L) 06/11/2009 1303   PLT 232 05/10/2017 1138   MCV 89 05/10/2017 1138   MCV 78.3 (L) 06/11/2009 1303   MCH 27.9 05/10/2017 1138   MCH 29.2 07/26/2012 0555   MCHC 31.3 (L) 05/10/2017 1138   MCHC 31.3 07/26/2012 0555   RDW 14.6 05/10/2017 1138   RDW 16.3 (H) 06/11/2009 1303   LYMPHSABS 1.3 05/10/2017 1138   LYMPHSABS 1.3 06/11/2009 1303   MONOABS 0.6 07/20/2012 0848   MONOABS 0.7 06/11/2009 1303   EOSABS 0.2 05/10/2017 1138   BASOSABS 0.0 05/10/2017 1138   BASOSABS 0.0 06/11/2009 1303   Iron/TIBC/Ferritin/ %Sat    Component  Value Date/Time   IRON 31 (L) 06/11/2009 1303   TIBC 420 06/11/2009 1303   FERRITIN 9 (L) 06/11/2009 1303   IRONPCTSAT 7 (L) 06/11/2009 1303   Lipid Panel     Component Value Date/Time   CHOL 178 05/10/2017 1138   TRIG 151 (H) 05/10/2017 1138   HDL 50 05/10/2017 1138   LDLCALC 98 05/10/2017 1138   Hepatic Function Panel     Component Value Date/Time   PROT 6.4 05/10/2017 1138   ALBUMIN 4.0 05/10/2017 1138   AST 15 05/10/2017 1138   ALT 17 05/10/2017 1138   ALKPHOS 104 05/10/2017 1138   BILITOT 0.3 05/10/2017 1138      Component Value Date/Time   TSH 2.670 05/10/2017 1138    ASSESSMENT AND PLAN: Type 2 diabetes mellitus without complication, without long-term current use of insulin (HCC)  At risk for heart disease  Class 3 obesity with serious comorbidity and body mass index (BMI) of 45.0 to 49.9 in adult, unspecified obesity type ([place])  PLAN:  Diabetes II Jocelyn Wilson has been given extensive diabetes education by myself today including ideal fasting and post-prandial blood glucose readings, individual ideal Hgb A1c goals and hypoglycemia prevention. We discussed the importance of good blood sugar control to decrease the likelihood of diabetic complications such as nephropathy, neuropathy, limb loss, blindness, coronary artery disease, and death. We discussed the importance of intensive lifestyle modification including diet, exercise and weight loss as the first line treatment for diabetes. Mardy agrees to  continue taking metformin and continue with diet, may need to add a GLP-1 in the future to get better controlled. She will follow up with our clinic in 2 to 3 weeks.  Cardiovascular risk counselling Jocelyn Wilson was given extended (15 minutes) coronary artery disease prevention counseling today. She is 66 y.o. female and has risk factors for heart disease including obesity and diabetes. We discussed intensive lifestyle modifications today with an emphasis on specific weight loss  instructions and strategies. Jocelyn Wilson was also informed of the importance of increasing exercise and decreasing saturated fats to help prevent heart disease.  We spent > than 50% of the 15 minute visit on the counseling as documented in the note.  Obesity Jocelyn Wilson is currently in the action stage of change. As such, her goal is to continue with weight loss efforts She has agreed to follow the Category 3 plan + breakfast options Jocelyn Wilson has been instructed to work up to a goal of 150 minutes of combined cardio and and strengthening exercise, or exercise bands per week for weight loss and overall health benefits. We discussed the following Behavioral Modification Strategies today: increasing lean protein intake and decreasing simple carbohydrates    Jocelyn Wilson has agreed to follow up with our clinic in 2 to 3 weeks. She was informed of the importance of frequent follow up visits to maximize her success with intensive lifestyle modifications for her multiple health conditions.  Jocelyn, Jocelyn Wilson, am acting as transcriptionist for Jocelyn Nip, MD  Jocelyn Wilson the above documentation for accuracy and completeness, and Jocelyn agree with the above. -Jocelyn Nip, MD    OBESITY BEHAVIORAL INTERVENTION VISIT  Today's visit was # 3 out of 22.  Starting weight: 232 lbs Starting date: 05/10/17 Today's weight : 221 lbs  Today's date: 06/08/17 Total lbs lost to date: 11 (Patients must lose 7 lbs in the first 6 months to continue with counseling)   ASK: We discussed the diagnosis of obesity with Jocelyn Wilson today and Jocelyn Wilson agreed to give [place] permission to discuss obesity behavioral modification therapy today.  ASSESS: Jocelyn Wilson has the diagnosis of obesity and her BMI today is 46 Jocelyn Wilson is in the action stage of change   ADVISE: Jocelyn Wilson was educated on the multiple health risks of obesity as well as the benefit of weight loss to improve her health. She was advised of the need for long term treatment  and the importance of lifestyle modifications.  AGREE: Multiple dietary modification options and treatment options were discussed and  Jocelyn Wilson agreed to follow the Category 2 plan + breakfast options We discussed the following Behavioral Modification Strategies today: increasing lean protein intake and decreasing simple carbohydrates

## 2017-06-14 ENCOUNTER — Telehealth (INDEPENDENT_AMBULATORY_CARE_PROVIDER_SITE_OTHER): Payer: Self-pay | Admitting: Family Medicine

## 2017-06-14 NOTE — Telephone Encounter (Signed)
Left a detailed message to hold the medication until her next office visit per Dr Leafy Ro. Did tell her that it is highly unlikely the medication is the cause of the rash but to be certain they will discuss after holding it. April, [place]

## 2017-06-14 NOTE — Telephone Encounter (Signed)
Pt called in stating she has had an itching rash around her vaginal area. She also says that she saw her pcp and they tested her for a yeast infection which came back negative. Says her daughter read an article about metformin having a side effect as such. She wants to know if the medication could be causing the rash. She also says the rash started the same time she started the medication.  Please call [phone] - may leave a detailed message on machine or with her husband.

## 2017-06-27 ENCOUNTER — Ambulatory Visit (INDEPENDENT_AMBULATORY_CARE_PROVIDER_SITE_OTHER): Payer: Medicare Other | Admitting: Family Medicine

## 2017-06-27 VITALS — BP 139/84 | HR 67 | Temp 98.4°F | Ht <= 58 in | Wt 218.0 lb

## 2017-06-27 DIAGNOSIS — IMO0001 Reserved for inherently not codable concepts without codable children: Secondary | ICD-10-CM

## 2017-06-27 DIAGNOSIS — E559 Vitamin D deficiency, unspecified: Secondary | ICD-10-CM

## 2017-06-27 DIAGNOSIS — E11628 Type 2 diabetes mellitus with other skin complications: Secondary | ICD-10-CM

## 2017-06-27 DIAGNOSIS — E669 Obesity, unspecified: Secondary | ICD-10-CM | POA: Diagnosis not present

## 2017-06-27 DIAGNOSIS — Z6841 Body Mass Index (BMI) 40.0 and over, adult: Secondary | ICD-10-CM

## 2017-06-27 MED ORDER — METFORMIN HCL 500 MG PO TABS: 500.0000 mg | ORAL_TABLET | Freq: Every day | ORAL | 0 refills | Status: DC

## 2017-06-27 MED ORDER — VITAMIN D (ERGOCALCIFEROL) 1.25 MG (50000 UNIT) PO CAPS: 50000.0000 [IU] | ORAL_CAPSULE | ORAL | 0 refills | Status: DC

## 2017-06-28 NOTE — Progress Notes (Signed)
Office: [phone]  /  Fax: [phone]   HPI:   Chief Complaint: OBESITY Jocelyn Wilson is here to discuss her progress with her obesity treatment plan. She is on the Category 2 plan and is following her eating plan approximately 95 % of the time. She states she is exercising 0 minutes 0 times per week. Jocelyn Wilson continues to lose weight on the category 2 plan and hunger is mostly controlled. Her weight is 218 lb (98.9 kg) today and has had a weight loss of 3 pounds over a period of 3 weeks since her last visit. She has lost 14 lbs since starting treatment with [place].  Vitamin D deficiency Jocelyn Wilson has a diagnosis of vitamin D deficiency. She is currently stable on vit D but not yet at goal and she denies nausea, vomiting or muscle weakness.  Diabetes II with Complications Jocelyn Wilson has a diagnosis of diabetes type II. Jocelyn Wilson denies any hypoglycemic episodes. She has been working on intensive lifestyle modifications including diet, exercise, and weight loss to help control her blood glucose levels. Jocelyn Wilson stopped metformin as she read it was related to yeast infections. She has had uncontrolled vaginal candidiasis and is on fluconazole.   ALLERGIES: Allergies  Allergen Reactions  . Iron Anaphylaxis    Iv iron  . Ciprofloxacin Nausea Only  . Contrast Media [Iodinated Diagnostic Agents] Itching and Rash  . Sulfa Antibiotics Itching and Rash  . Tape Rash    Adhesive tape    MEDICATIONS: Current Outpatient Prescriptions on File Prior to Visit  Medication Sig Dispense Refill  . busPIRone (BUSPAR) 30 MG tablet TAKE 1 TABLET BY MOUTH 3  TIMES DAILY 270 tablet 1  . esomeprazole (NEXIUM) 40 MG capsule TAKE 1 CAPSULE BY MOUTH TWO TIMES DAILY 180 capsule 1  . ferrous sulfate 325 (65 FE) MG tablet Take 325 mg by mouth 2 (two) times daily.    Jocelyn Wilson Kitchen FLOVENT HFA 220 MCG/ACT inhaler Inhale 1 puff into the lungs daily.   1  . Ibuprofen (ADVIL) 200 MG CAPS Take 2 capsules by mouth 3 (three) times daily as needed.  For migraines    . Interferon Beta-1b (BETASERON) 0.3 MG KIT injection INJECT 0.25 MG UNDER THE SKIN EVERY OTHER DAY AS DIRECTED 42 each 2  . levothyroxine (SYNTHROID, LEVOTHROID) 100 MCG tablet TK 1 T PO QD  0  . meclizine (ANTIVERT) 12.5 MG tablet TAKE 1 TABLET(12.5 MG) BY MOUTH THREE TIMES DAILY AS NEEDED FOR DIZZINESS 270 tablet 1  . metoprolol succinate (TOPROL-XL) 50 MG 24 hr tablet TAKE 1 TABLET BY MOUTH  DAILY 90 tablet 3  . ONE TOUCH ULTRA TEST test strip USE TO CHECK FASTING AND PRE-DINNER BLOOD GLUCOSE BID  12  . ONETOUCH DELICA LANCETS 03P MISC USE TO CHECK FASTING AND PRE-DINNER BLOOD GLUCOSE BID  12  . potassium citrate (UROCIT-K) 10 MEQ (1080 MG) SR tablet Take 10 mEq by mouth 3 (three) times daily with meals.    . ranitidine (ZANTAC) 75 MG tablet Take 75 mg by mouth at bedtime.    Jocelyn Wilson Kitchen rOPINIRole (REQUIP) 1 MG tablet Take 1 tablet (1 mg total) by mouth 4 (four) times daily. 360 tablet 3  . sertraline (ZOLOFT) 100 MG tablet TAKE 1 AND 1/2 TABLETS BY  MOUTH DAILY 135 tablet 3  . traMADol (ULTRAM) 50 MG tablet Take 1 tablet (50 mg total) by mouth 3 (three) times daily as needed. 90 tablet 3  . traZODone (DESYREL) 100 MG tablet TAKE 1/2 TO 1 TABLET BY  MOUTH NIGHTLY FOR SLEEP 90 tablet 1  . vitamin B-12 (CYANOCOBALAMIN) 100 MCG tablet Take 100 mcg by mouth daily.    Jocelyn Wilson Kitchen atorvastatin (LIPITOR) 20 MG tablet Take 20 mg by mouth daily.     No current facility-administered medications on file prior to visit.     PAST MEDICAL HISTORY: Past Medical History:  Diagnosis Date  . Allergy   . Anxiety   . Asthma   . Back pain   . Chronic kidney disease    KIDNEY STONES WITH STENTS  . Depression   . Diabetes ([place])   . Diverticula of colon   . Dyspnea   . Eczema   . Food allergy   . Gallbladder problem   . GERD (gastroesophageal reflux disease)   . Hypercholesterolemia   . Hyperlipidemia   . Hypothyroidism   . Joint pain   . Kidney stones   . Leg edema   . Migraine headache   .  Multiple sclerosis ([place])   . Obesity   . Palpitations   . PONV (postoperative nausea and vomiting)   . Restless leg syndrome   . Right sided sciatica 10/29/2015  . Snoring   . Thyroid disease    hyperthyroidism  . Unsteady gait   . Vertigo   . Vision abnormalities     PAST SURGICAL HISTORY: Past Surgical History:  Procedure Laterality Date  . BREAST SURGERY     AUGMENTATION  . carpel tunnel    . CHOLECYSTECTOMY    . DILATION AND CURETTAGE OF UTERUS  1983  . FINGER SURGERY  x8  . HERNIA REPAIR    . LITHOTRIPSY    . NISSEN FUNDOPLICATION    . VENTRAL HERNIA REPAIR  07/25/2012   Procedure: LAPAROSCOPIC VENTRAL HERNIA;  Surgeon: Adin Hector, MD;  Location: [facility];  Service: General;  Laterality: N/A;    SOCIAL HISTORY: Social History  Substance Use Topics  . Smoking status: Former Research scientist (life sciences)  . Smokeless tobacco: Never Used  . Alcohol use No    FAMILY HISTORY: Family History  Problem Relation Age of Onset  . Hyperlipidemia Mother   . Glaucoma Mother   . Hypertension Mother   . Anxiety disorder Mother   . Depression Mother   . Heart disease Father   . Cancer Father        prostate  . Diabetes Father   . Dementia Father   . Hypertension Father   . Hyperlipidemia Father   . Sleep apnea Father   . Obesity Father   . Cancer Brother        esophageal  . Arthritis Sister        RA    ROS: Review of Systems  Constitutional: Positive for weight loss.  Gastrointestinal: Negative for nausea and vomiting.  Musculoskeletal:       Negative muscle weakness  Endo/Heme/Allergies:       Negative hypoglycemia    PHYSICAL EXAM: Blood pressure 139/84, pulse 67, temperature 98.4 F (36.9 C), temperature source Oral, height '4\' 9"'  (1.448 m), weight 218 lb (98.9 kg), SpO2 95 %. Body mass index is 47.17 kg/m. Physical Exam  Constitutional: She is oriented to person, place, and time. She appears well-developed and well-nourished.  Cardiovascular: Normal rate.     Pulmonary/Chest: Effort normal.  Musculoskeletal: Normal range of motion.  Neurological: She is oriented to person, place, and time.  Skin: Skin is warm and dry.  Psychiatric: She has a normal mood and affect. Her behavior is normal.  Vitals reviewed.   RECENT LABS AND TESTS: BMET    Component Value Date/Time   NA 144 05/10/2017 1138   K 4.6 05/10/2017 1138   CL 103 05/10/2017 1138   CO2 26 05/10/2017 1138   GLUCOSE 138 (H) 05/10/2017 1138   GLUCOSE 127 (H) 07/26/2012 0555   BUN 14 05/10/2017 1138   CREATININE 0.68 05/10/2017 1138   CALCIUM 9.2 05/10/2017 1138   GFRNONAA 92 05/10/2017 1138   GFRAA 106 05/10/2017 1138   Lab Results  Component Value Date   HGBA1C 7.5 (H) 05/10/2017   Lab Results  Component Value Date   INSULIN 49.2 (H) 05/10/2017   CBC    Component Value Date/Time   WBC 6.2 05/10/2017 1138   WBC 6.6 07/26/2012 0555   RBC 4.58 05/10/2017 1138   RBC 3.77 (L) 07/26/2012 0555   HGB 12.8 05/10/2017 1138   HGB 10.9 (L) 06/11/2009 1303   HCT 40.9 05/10/2017 1138   HCT 33.6 (L) 06/11/2009 1303   PLT 232 05/10/2017 1138   MCV 89 05/10/2017 1138   MCV 78.3 (L) 06/11/2009 1303   MCH 27.9 05/10/2017 1138   MCH 29.2 07/26/2012 0555   MCHC 31.3 (L) 05/10/2017 1138   MCHC 31.3 07/26/2012 0555   RDW 14.6 05/10/2017 1138   RDW 16.3 (H) 06/11/2009 1303   LYMPHSABS 1.3 05/10/2017 1138   LYMPHSABS 1.3 06/11/2009 1303   MONOABS 0.6 07/20/2012 0848   MONOABS 0.7 06/11/2009 1303   EOSABS 0.2 05/10/2017 1138   BASOSABS 0.0 05/10/2017 1138   BASOSABS 0.0 06/11/2009 1303   Iron/TIBC/Ferritin/ %Sat    Component Value Date/Time   IRON 31 (L) 06/11/2009 1303   TIBC 420 06/11/2009 1303   FERRITIN 9 (L) 06/11/2009 1303   IRONPCTSAT 7 (L) 06/11/2009 1303   Lipid Panel     Component Value Date/Time   CHOL 178 05/10/2017 1138   TRIG 151 (H) 05/10/2017 1138   HDL 50 05/10/2017 1138   LDLCALC 98 05/10/2017 1138   Hepatic Function Panel     Component Value  Date/Time   PROT 6.4 05/10/2017 1138   ALBUMIN 4.0 05/10/2017 1138   AST 15 05/10/2017 1138   ALT 17 05/10/2017 1138   ALKPHOS 104 05/10/2017 1138   BILITOT 0.3 05/10/2017 1138      Component Value Date/Time   TSH 2.670 05/10/2017 1138    ASSESSMENT AND PLAN: Type 2 diabetes mellitus with other skin complication, without long-term current use of insulin ([place]) - Plan: metFORMIN (GLUCOPHAGE) 500 MG tablet  Vitamin D deficiency - Plan: Vitamin D, Ergocalciferol, (DRISDOL) 50000 units CAPS capsule  Class 3 obesity with serious comorbidity and body mass index (BMI) of 45.0 to 49.9 in adult, unspecified obesity type ([place])  PLAN:  Vitamin D Deficiency Jocelyn Wilson was informed that low vitamin D levels contributes to fatigue and are associated with obesity, breast, and colon cancer. She agrees to continue to take prescription Vit D '@50' ,000 IU every week, we will refill for 1 month and will follow up for routine testing of vitamin D, at least 2-3 times per year. She was informed of the risk of over-replacement of vitamin D and agrees to not increase her dose unless he discusses this with [place] first. Jocelyn Wilson agrees to follow up with our clinic in 2 to 3 weeks.  Diabetes II with Complications Jocelyn Wilson has been given extensive diabetes education by myself today including ideal fasting and post-prandial blood glucose readings, individual ideal Hgb A1c goals  and hypoglycemia  prevention. We discussed the importance of good blood sugar control to decrease the likelihood of diabetic complications such as nephropathy, neuropathy, limb loss, blindness, coronary artery disease, and death. We discussed the importance of intensive lifestyle modification including diet, exercise and weight loss as the first line treatment for diabetes. Jocelyn Wilson agrees to re-start metformin, we will refill for 1 month and she will check BGs 2 times daily. Jocelyn Wilson agrees to follow up with our clinic in 2 to 3 weeks.  Obesity Jocelyn Wilson is  currently in the action stage of change. As such, her goal is to continue with weight loss efforts She has agreed to change to follow a lower carbohydrate, vegetable and lean protein rich diet plan Jocelyn Wilson has been instructed to work up to a goal of 150 minutes of combined cardio and strengthening exercise per week for weight loss and overall health benefits. We discussed the following Behavioral Modification Strategies today: increasing lean protein intake and decreasing simple carbohydrates   Jocelyn Wilson has agreed to follow up with our clinic in 2 to 3 weeks. She was informed of the importance of frequent follow up visits to maximize her success with intensive lifestyle modifications for her multiple health conditions.  I, Doreene Nest, am acting as transcriptionist for Dennard Nip, MD  I have reviewed the above documentation for accuracy and completeness, and I agree with the above. -Dennard Nip, MD    OBESITY BEHAVIORAL INTERVENTION VISIT  Today's visit was # 4 out of 22.  Starting weight: 232 lbs Starting date: 05/10/17 Today's weight : 218 lbs Today's date: 06/27/2017 Total lbs lost to date: 14 (Patients must lose 7 lbs in the first 6 months to continue with counseling)   ASK: We discussed the diagnosis of obesity with Jocelyn Wilson today and Jocelyn Wilson agreed to give [place] permission to discuss obesity behavioral modification therapy today.  ASSESS: Jocelyn Wilson has the diagnosis of obesity and her BMI today is 47.16 Jocelyn Wilson is in the action stage of change   ADVISE: Shaddai was educated on the multiple health risks of obesity as well as the benefit of weight loss to improve her health. She was advised of the need for long term treatment and the importance of lifestyle modifications.  AGREE: Multiple dietary modification options and treatment options were discussed and  Jocelyn Wilson agreed to change to follow a lower carbohydrate, vegetable and lean protein rich diet plan We discussed the  following Behavioral Modification Strategies today: increasing lean protein intake and decreasing simple carbohydrates

## 2017-07-06 ENCOUNTER — Other Ambulatory Visit: Payer: Self-pay | Admitting: Family Medicine

## 2017-07-06 ENCOUNTER — Other Ambulatory Visit (HOSPITAL_COMMUNITY)
Admission: RE | Admit: 2017-07-06 | Discharge: 2017-07-06 | Disposition: A | Discharge status: Home / Self Care | Payer: Medicare Other | Source: Ambulatory Visit | Attending: Family Medicine | Admitting: Family Medicine

## 2017-07-06 DIAGNOSIS — Z124 Encounter for screening for malignant neoplasm of cervix: Secondary | ICD-10-CM | POA: Insufficient documentation

## 2017-07-07 ENCOUNTER — Telehealth (INDEPENDENT_AMBULATORY_CARE_PROVIDER_SITE_OTHER): Payer: Self-pay | Admitting: Family Medicine

## 2017-07-07 NOTE — Telephone Encounter (Signed)
PT [place] PCP 9/26 FOR PHYSICAL.  HER PCP WANTS HER TO TAKE METFORMIN 2 X'S A DAY.  CAN DR B SEND REVISED RX PRESCRIPTION?  I DID NOT LET PT KNOW THAT DR B IS OUT OF OFFICE TODAY.

## 2017-07-07 NOTE — Telephone Encounter (Signed)
Spoke with the patient and informed her to continue the Metformin once a day until her next visit on 10/4 and we will address that then. Patient verbalized understanding. Arshan Jabs, [place]

## 2017-07-08 LAB — CYTOLOGY - PAP
Diagnosis: NEGATIVE
HPV (WINDOPATH): NOT DETECTED

## 2017-07-14 ENCOUNTER — Ambulatory Visit (INDEPENDENT_AMBULATORY_CARE_PROVIDER_SITE_OTHER): Payer: Medicare Other | Admitting: Family Medicine

## 2017-07-14 ENCOUNTER — Other Ambulatory Visit (INDEPENDENT_AMBULATORY_CARE_PROVIDER_SITE_OTHER): Payer: Self-pay | Admitting: Family Medicine

## 2017-07-14 VITALS — BP 125/74 | HR 63 | Temp 97.6°F | Ht <= 58 in | Wt 214.0 lb

## 2017-07-14 DIAGNOSIS — E1169 Type 2 diabetes mellitus with other specified complication: Secondary | ICD-10-CM

## 2017-07-14 DIAGNOSIS — Z6841 Body Mass Index (BMI) 40.0 and over, adult: Secondary | ICD-10-CM | POA: Diagnosis not present

## 2017-07-14 MED ORDER — METFORMIN HCL 500 MG PO TABS: 500.0000 mg | ORAL_TABLET | Freq: Two times a day (BID) | ORAL | 0 refills | Status: DC

## 2017-07-18 NOTE — Progress Notes (Signed)
Office: [phone]  /  Fax: [phone]   HPI:   Chief Complaint: OBESITY Jocelyn Wilson is here to discuss her progress with her obesity treatment plan. She is on the  follow a lower carbohydrate, vegetable and lean protein rich diet plan and is following her eating plan approximately 5 % of the time. She states she is exercising 0 minutes 0 times per week. Jocelyn Wilson has done well with weight loss on her low carb plan but has deviated significantly still, she has made enough good choices.  Her weight is 214 lb (97.1 kg) today and has had a weight loss of 4 pounds over a period of 2 weeks since her last visit. She has lost 18 lbs since starting treatment with [place].  Diabetes II Jocelyn Wilson has a diagnosis of diabetes type II. Jocelyn Wilson states BGs range between 140 and 150 and denies any hypoglycemic episodes. Last A1c was Hemoglobin A1C Latest Ref Rng & Units 05/10/2017  HGBA1C 4.8 - 5.6 % 7.5(H)  Some recent data might be hidden    She has been working on intensive lifestyle modifications including diet, exercise, and weight loss to help control her blood glucose levels.   ALLERGIES: Allergies  Allergen Reactions  . Iron Anaphylaxis    Iv iron  . Ciprofloxacin Nausea Only  . Contrast Media [Iodinated Diagnostic Agents] Itching and Rash  . Sulfa Antibiotics Itching and Rash  . Tape Rash    Adhesive tape    MEDICATIONS: Current Outpatient Prescriptions on File Prior to Visit  Medication Sig Dispense Refill  . atorvastatin (LIPITOR) 20 MG tablet Take 20 mg by mouth daily.    . busPIRone (BUSPAR) 30 MG tablet TAKE 1 TABLET BY MOUTH 3  TIMES DAILY 270 tablet 1  . esomeprazole (NEXIUM) 40 MG capsule TAKE 1 CAPSULE BY MOUTH TWO TIMES DAILY 180 capsule 1  . ferrous sulfate 325 (65 FE) MG tablet Take 325 mg by mouth daily.     Marland Kitchen FLOVENT HFA 220 MCG/ACT inhaler Inhale 1 puff into the lungs daily.   1  . fluconazole (DIFLUCAN) 200 MG tablet Take 200 mg by mouth once a week.    . Ibuprofen (ADVIL) 200  MG CAPS Take 2 capsules by mouth 3 (three) times daily as needed. For migraines    . Interferon Beta-1b (BETASERON) 0.3 MG KIT injection INJECT 0.25 MG UNDER THE SKIN EVERY OTHER DAY AS DIRECTED 42 each 2  . levothyroxine (SYNTHROID, LEVOTHROID) 100 MCG tablet TK 1 T PO QD  0  . meclizine (ANTIVERT) 12.5 MG tablet TAKE 1 TABLET(12.5 MG) BY MOUTH THREE TIMES DAILY AS NEEDED FOR DIZZINESS 270 tablet 1  . metoprolol succinate (TOPROL-XL) 50 MG 24 hr tablet TAKE 1 TABLET BY MOUTH  DAILY 90 tablet 3  . ONE TOUCH ULTRA TEST test strip USE TO CHECK FASTING AND PRE-DINNER BLOOD GLUCOSE BID  12  . ONETOUCH DELICA LANCETS 85I MISC USE TO CHECK FASTING AND PRE-DINNER BLOOD GLUCOSE BID  12  . potassium citrate (UROCIT-K) 10 MEQ (1080 MG) SR tablet Take 10 mEq by mouth 3 (three) times daily with meals.    . ranitidine (ZANTAC) 75 MG tablet Take 75 mg by mouth at bedtime.    Marland Kitchen rOPINIRole (REQUIP) 1 MG tablet Take 1 tablet (1 mg total) by mouth 4 (four) times daily. 360 tablet 3  . sertraline (ZOLOFT) 100 MG tablet TAKE 1 AND 1/2 TABLETS BY  MOUTH DAILY 135 tablet 3  . traMADol (ULTRAM) 50 MG tablet Take 1  tablet (50 mg total) by mouth 3 (three) times daily as needed. 90 tablet 3  . traZODone (DESYREL) 100 MG tablet TAKE 1/2 TO 1 TABLET BY  MOUTH NIGHTLY FOR SLEEP 90 tablet 1  . vitamin B-12 (CYANOCOBALAMIN) 100 MCG tablet Take 100 mcg by mouth daily.    . Vitamin D, Ergocalciferol, (DRISDOL) 50000 units CAPS capsule Take 1 capsule (50,000 Units total) by mouth every 7 (seven) days. 4 capsule 0   No current facility-administered medications on file prior to visit.     PAST MEDICAL HISTORY: Past Medical History:  Diagnosis Date  . Allergy   . Anxiety   . Asthma   . Back pain   . Chronic kidney disease    KIDNEY STONES WITH STENTS  . Depression   . Diabetes ([place])   . Diverticula of colon   . Dyspnea   . Eczema   . Food allergy   . Gallbladder problem   . GERD (gastroesophageal reflux disease)   .  Hypercholesterolemia   . Hyperlipidemia   . Hypothyroidism   . Joint pain   . Kidney stones   . Leg edema   . Migraine headache   . Multiple sclerosis ([place])   . Obesity   . Palpitations   . PONV (postoperative nausea and vomiting)   . Restless leg syndrome   . Right sided sciatica 10/29/2015  . Snoring   . Thyroid disease    hyperthyroidism  . Unsteady gait   . Vertigo   . Vision abnormalities     PAST SURGICAL HISTORY: Past Surgical History:  Procedure Laterality Date  . BREAST SURGERY     AUGMENTATION  . carpel tunnel    . CHOLECYSTECTOMY    . DILATION AND CURETTAGE OF UTERUS  1983  . FINGER SURGERY  x8  . HERNIA REPAIR    . LITHOTRIPSY    . NISSEN FUNDOPLICATION    . VENTRAL HERNIA REPAIR  07/25/2012   Procedure: LAPAROSCOPIC VENTRAL HERNIA;  Surgeon: Adin Hector, MD;  Location: [facility];  Service: General;  Laterality: N/A;    SOCIAL HISTORY: Social History  Substance Use Topics  . Smoking status: Former Research scientist (life sciences)  . Smokeless tobacco: Never Used  . Alcohol use No    FAMILY HISTORY: Family History  Problem Relation Age of Onset  . Hyperlipidemia Mother   . Glaucoma Mother   . Hypertension Mother   . Anxiety disorder Mother   . Depression Mother   . Heart disease Father   . Cancer Father        prostate  . Diabetes Father   . Dementia Father   . Hypertension Father   . Hyperlipidemia Father   . Sleep apnea Father   . Obesity Father   . Cancer Brother        esophageal  . Arthritis Sister        RA    ROS: Review of Systems  Constitutional: Positive for weight loss.  Endo/Heme/Allergies:       Polyphagia  All other systems reviewed and are negative.   PHYSICAL EXAM: Blood pressure 125/74, pulse 63, temperature 97.6 F (36.4 C), temperature source Oral, height _0  (1.448 m), weight 214 lb (97.1 kg), SpO2 97 %. Body mass index is 46.31 kg/m. Physical Exam  Constitutional: She is oriented to person, place, and time. She appears  well-developed and well-nourished.  HENT:  Head: Normocephalic.  Eyes: EOM are normal.  Neck: Normal range of motion.  Pulmonary/Chest: Effort  normal.  Neurological: She is alert and oriented to person, place, and time.  Skin: Skin is warm and dry.  Psychiatric: She has a normal mood and affect.  Vitals reviewed.   RECENT LABS AND TESTS: BMET    Component Value Date/Time   NA 144 05/10/2017 1138   K 4.6 05/10/2017 1138   CL 103 05/10/2017 1138   CO2 26 05/10/2017 1138   GLUCOSE 138 (H) 05/10/2017 1138   GLUCOSE 127 (H) 07/26/2012 0555   BUN 14 05/10/2017 1138   CREATININE 0.68 05/10/2017 1138   CALCIUM 9.2 05/10/2017 1138   GFRNONAA 92 05/10/2017 1138   GFRAA 106 05/10/2017 1138   Lab Results  Component Value Date   HGBA1C 7.5 (H) 05/10/2017   Lab Results  Component Value Date   INSULIN 49.2 (H) 05/10/2017   CBC    Component Value Date/Time   WBC 6.2 05/10/2017 1138   WBC 6.6 07/26/2012 0555   RBC 4.58 05/10/2017 1138   RBC 3.77 (L) 07/26/2012 0555   HGB 12.8 05/10/2017 1138   HGB 10.9 (L) 06/11/2009 1303   HCT 40.9 05/10/2017 1138   HCT 33.6 (L) 06/11/2009 1303   PLT 232 05/10/2017 1138   MCV 89 05/10/2017 1138   MCV 78.3 (L) 06/11/2009 1303   MCH 27.9 05/10/2017 1138   MCH 29.2 07/26/2012 0555   MCHC 31.3 (L) 05/10/2017 1138   MCHC 31.3 07/26/2012 0555   RDW 14.6 05/10/2017 1138   RDW 16.3 (H) 06/11/2009 1303   LYMPHSABS 1.3 05/10/2017 1138   LYMPHSABS 1.3 06/11/2009 1303   MONOABS 0.6 07/20/2012 0848   MONOABS 0.7 06/11/2009 1303   EOSABS 0.2 05/10/2017 1138   BASOSABS 0.0 05/10/2017 1138   BASOSABS 0.0 06/11/2009 1303   Iron/TIBC/Ferritin/ %Sat    Component Value Date/Time   IRON 31 (L) 06/11/2009 1303   TIBC 420 06/11/2009 1303   FERRITIN 9 (L) 06/11/2009 1303   IRONPCTSAT 7 (L) 06/11/2009 1303   Lipid Panel     Component Value Date/Time   CHOL 178 05/10/2017 1138   TRIG 151 (H) 05/10/2017 1138   HDL 50 05/10/2017 1138   LDLCALC 98  05/10/2017 1138   Hepatic Function Panel     Component Value Date/Time   PROT 6.4 05/10/2017 1138   ALBUMIN 4.0 05/10/2017 1138   AST 15 05/10/2017 1138   ALT 17 05/10/2017 1138   ALKPHOS 104 05/10/2017 1138   BILITOT 0.3 05/10/2017 1138      Component Value Date/Time   TSH 2.670 05/10/2017 1138    ASSESSMENT AND PLAN: Type 2 diabetes mellitus with other specified complication, without long-term current use of insulin ([place]) - Plan: metFORMIN (GLUCOPHAGE) 500 MG tablet  Class 3 severe obesity with serious comorbidity and body mass index (BMI) of 45.0 to 49.9 in adult, unspecified obesity type ([place])  PLAN:  Diabetes II Jocelyn Wilson has been given extensive diabetes education by myself today including ideal fasting and post-prandial blood glucose readings, individual ideal HgA1c goals  and hypoglycemia prevention. We discussed the importance of good blood sugar control to decrease the likelihood of diabetic complications such as nephropathy, neuropathy, limb loss, blindness, coronary artery disease, and death. We discussed the importance of intensive lifestyle modification including diet, exercise and weight loss as the first line treatment for diabetes. Jocelyn Wilson agrees to increase her metformin to 500 mg BID in which a prescription was written today for a 30 day supply and no refills and will follow up at the agreed upon  time.  Obesity Jocelyn Wilson is currently in the action stage of change. As such, her goal is to continue with weight loss efforts She has agreed to follow a lower carbohydrate, vegetable and lean protein rich diet plan Jocelyn Wilson has been instructed to work up to a goal of 150 minutes of combined cardio and strengthening exercise per week for weight loss and overall health benefits. We discussed the following Behavioral Modification Stratagies today: increasing lean protein intake, decreasing simple carbohydrates ,emotional eating strategies and no skipping meals   Jocelyn Wilson has agreed  to follow up with our clinic in 3 weeks. She was informed of the importance of frequent follow up visits to maximize her success with intensive lifestyle modifications for her multiple health conditions.  I, April Moore, am acting as Location manager for Dennard Nip, MD  I have reviewed the above documentation for accuracy and completeness, and I agree with the above. -Dennard Nip, MD   Office: [phone]  /  Fax: [phone]  OBESITY BEHAVIORAL INTERVENTION VISIT  Today's visit was # 5 out of 22.  Starting weight: 232 Starting date: 05/10/2017 Today's weight : Weight: 214 lb (97.1 kg)  Today's date: 07/19/2017 Total lbs lost to date: 22 (Patients must lose 7 lbs in the first 6 months to continue with counseling)   ASK: We discussed the diagnosis of obesity with Jocelyn Wilson today and Jocelyn Wilson agreed to give [place] permission to discuss obesity behavioral modification therapy today.  ASSESS: Jocelyn Wilson has the diagnosis of obesity and her BMI today is _0 @ Jocelyn Wilson is in the action stage of change   ADVISE: Jocelyn Wilson was educated on the multiple health risks of obesity as well as the benefit of weight loss to improve her health. She was advised of the need for long term treatment and the importance of lifestyle modifications.  AGREE: Multiple dietary modification options and treatment options were discussed and  Jocelyn Wilson agreed to follow a lower carbohydrate, vegetable and lean protein rich diet plan We discussed the following Behavioral Modification Stratagies today: increasing lean protein intake, decreasing simple carbohydrates, emotional eating strategies and no skipping meals

## 2017-07-30 ENCOUNTER — Other Ambulatory Visit (INDEPENDENT_AMBULATORY_CARE_PROVIDER_SITE_OTHER): Payer: Self-pay | Admitting: Family Medicine

## 2017-07-30 DIAGNOSIS — E559 Vitamin D deficiency, unspecified: Secondary | ICD-10-CM

## 2017-08-02 ENCOUNTER — Ambulatory Visit (INDEPENDENT_AMBULATORY_CARE_PROVIDER_SITE_OTHER): Payer: Medicare Other | Admitting: Family Medicine

## 2017-08-02 VITALS — BP 112/71 | HR 68 | Temp 97.7°F | Ht <= 58 in | Wt 213.0 lb

## 2017-08-02 DIAGNOSIS — E559 Vitamin D deficiency, unspecified: Secondary | ICD-10-CM | POA: Diagnosis not present

## 2017-08-02 DIAGNOSIS — E119 Type 2 diabetes mellitus without complications: Secondary | ICD-10-CM

## 2017-08-02 DIAGNOSIS — Z6841 Body Mass Index (BMI) 40.0 and over, adult: Secondary | ICD-10-CM | POA: Diagnosis not present

## 2017-08-02 MED ORDER — VITAMIN D (ERGOCALCIFEROL) 1.25 MG (50000 UNIT) PO CAPS: 50000.0000 [IU] | ORAL_CAPSULE | ORAL | 0 refills | Status: DC

## 2017-08-02 NOTE — Progress Notes (Signed)
Office: [phone]  /  Fax: [phone]   HPI:   Chief Complaint: OBESITY Jocelyn Wilson is here to discuss her progress with her obesity treatment plan. She is on the lower carbohydrate, vegetable and lean protein rich diet plan and is following her eating plan approximately 90 % of the time. She states she is exercising 0 minutes 0 times per week. Jocelyn Wilson continues to lose weight but struggled more with power outage and had to increase eating out. She is getting ready to travel and will have increased celebration eating for her birthday. Her weight is 213 lb (96.6 kg) today and has had a weight loss of 1 pound over a period of 2 weeks since her last visit. She has lost 19 lbs since starting treatment with [place].  Vitamin D deficiency Jocelyn Wilson has a diagnosis of vitamin D deficiency. She is currently stable on vit D and is not yet at goal. Jocelyn Wilson denies nausea, vomiting or muscle weakness.  Diabetes II Jocelyn Wilson has a diagnosis of diabetes type II. She started on metformin 2 times daily and states morning and 2 hour post prandial BGs range between 140 and 150's. Jocelyn Wilson denies any hypoglycemic episodes, nausea or vomiting. She has been working on intensive lifestyle modifications including diet, exercise, and weight loss to help control her blood glucose levels.   ALLERGIES: Allergies  Allergen Reactions  . Iron Anaphylaxis    Iv iron  . Ciprofloxacin Nausea Only  . Contrast Media [Iodinated Diagnostic Agents] Itching and Rash  . Sulfa Antibiotics Itching and Rash  . Tape Rash    Adhesive tape    MEDICATIONS: Current Outpatient Prescriptions on File Prior to Visit  Medication Sig Dispense Refill  . atorvastatin (LIPITOR) 20 MG tablet Take 20 mg by mouth daily.    . busPIRone (BUSPAR) 30 MG tablet TAKE 1 TABLET BY MOUTH 3  TIMES DAILY 270 tablet 1  . esomeprazole (NEXIUM) 40 MG capsule TAKE 1 CAPSULE BY MOUTH TWO TIMES DAILY 180 capsule 1  . ferrous sulfate 325 (65 FE) MG tablet Take 325 mg  by mouth daily.     Jocelyn Wilson FLOVENT HFA 220 MCG/ACT inhaler Inhale 1 puff into the lungs daily.   1  . Ibuprofen (ADVIL) 200 MG CAPS Take 2 capsules by mouth 3 (three) times daily as needed. For migraines    . Interferon Beta-1b (BETASERON) 0.3 MG KIT injection INJECT 0.25 MG UNDER THE SKIN EVERY OTHER DAY AS DIRECTED 42 each 2  . levothyroxine (SYNTHROID, LEVOTHROID) 100 MCG tablet TK 1 T PO QD  0  . meclizine (ANTIVERT) 12.5 MG tablet TAKE 1 TABLET(12.5 MG) BY MOUTH THREE TIMES DAILY AS NEEDED FOR DIZZINESS 270 tablet 1  . metFORMIN (GLUCOPHAGE) 500 MG tablet Take 1 tablet (500 mg total) by mouth 2 (two) times daily at 10 AM and 5 PM. 60 tablet 0  . metoprolol succinate (TOPROL-XL) 50 MG 24 hr tablet TAKE 1 TABLET BY MOUTH  DAILY 90 tablet 3  . ONE TOUCH ULTRA TEST test strip USE TO CHECK FASTING AND PRE-DINNER BLOOD GLUCOSE BID  12  . ONETOUCH DELICA LANCETS 28Z MISC USE TO CHECK FASTING AND PRE-DINNER BLOOD GLUCOSE BID  12  . potassium citrate (UROCIT-K) 10 MEQ (1080 MG) SR tablet Take 10 mEq by mouth 3 (three) times daily with meals.    . ranitidine (ZANTAC) 75 MG tablet Take 75 mg by mouth at bedtime.    Jocelyn Wilson rOPINIRole (REQUIP) 1 MG tablet Take 1 tablet (1 mg total) by  mouth 4 (four) times daily. 360 tablet 3  . sertraline (ZOLOFT) 100 MG tablet TAKE 1 AND 1/2 TABLETS BY  MOUTH DAILY 135 tablet 3  . traMADol (ULTRAM) 50 MG tablet Take 1 tablet (50 mg total) by mouth 3 (three) times daily as needed. 90 tablet 3  . traZODone (DESYREL) 100 MG tablet TAKE 1/2 TO 1 TABLET BY  MOUTH NIGHTLY FOR SLEEP 90 tablet 1  . vitamin B-12 (CYANOCOBALAMIN) 100 MCG tablet Take 100 mcg by mouth daily.     No current facility-administered medications on file prior to visit.     PAST MEDICAL HISTORY: Past Medical History:  Diagnosis Date  . Allergy   . Anxiety   . Asthma   . Back pain   . Chronic kidney disease    KIDNEY STONES WITH STENTS  . Depression   . Diabetes ([place])   . Diverticula of colon   .  Dyspnea   . Eczema   . Food allergy   . Gallbladder problem   . GERD (gastroesophageal reflux disease)   . Hypercholesterolemia   . Hyperlipidemia   . Hypothyroidism   . Joint pain   . Kidney stones   . Leg edema   . Migraine headache   . Multiple sclerosis ([place])   . Obesity   . Palpitations   . PONV (postoperative nausea and vomiting)   . Restless leg syndrome   . Right sided sciatica 10/29/2015  . Snoring   . Thyroid disease    hyperthyroidism  . Unsteady gait   . Vertigo   . Vision abnormalities     PAST SURGICAL HISTORY: Past Surgical History:  Procedure Laterality Date  . BREAST SURGERY     AUGMENTATION  . carpel tunnel    . CHOLECYSTECTOMY    . DILATION AND CURETTAGE OF UTERUS  1983  . FINGER SURGERY  x8  . HERNIA REPAIR    . LITHOTRIPSY    . NISSEN FUNDOPLICATION    . VENTRAL HERNIA REPAIR  07/25/2012   Procedure: LAPAROSCOPIC VENTRAL HERNIA;  Surgeon: Jocelyn Hector, MD;  Location: [facility];  Service: General;  Laterality: N/A;    SOCIAL HISTORY: Social History  Substance Use Topics  . Smoking status: Former Research scientist (life sciences)  . Smokeless tobacco: Never Used  . Alcohol use No    FAMILY HISTORY: Family History  Problem Relation Age of Onset  . Hyperlipidemia Mother   . Glaucoma Mother   . Hypertension Mother   . Anxiety disorder Mother   . Depression Mother   . Heart disease Father   . Cancer Father        prostate  . Diabetes Father   . Dementia Father   . Hypertension Father   . Hyperlipidemia Father   . Sleep apnea Father   . Obesity Father   . Cancer Brother        esophageal  . Arthritis Sister        RA    ROS: Review of Systems  Constitutional: Positive for weight loss.  Gastrointestinal: Negative for nausea and vomiting.  Musculoskeletal:       Negative muscle weakness  Endo/Heme/Allergies:       Negative hypoglycemia    PHYSICAL EXAM: Blood pressure 112/71, pulse 68, temperature 97.7 F (36.5 C), temperature source Oral, height  '4\' 9"'  (1.448 m), weight 213 lb (96.6 kg), SpO2 97 %. Body mass index is 46.09 kg/m. Physical Exam  Constitutional: She is oriented to person, place, and time. She  appears well-developed and well-nourished.  Cardiovascular: Normal rate.   Pulmonary/Chest: Effort normal.  Musculoskeletal: Normal range of motion.  Neurological: She is oriented to person, place, and time.  Skin: Skin is warm and dry.  Psychiatric: She has a normal mood and affect. Her behavior is normal.  Vitals reviewed.   RECENT LABS AND TESTS: BMET    Component Value Date/Time   NA 144 05/10/2017 1138   K 4.6 05/10/2017 1138   CL 103 05/10/2017 1138   CO2 26 05/10/2017 1138   GLUCOSE 138 (H) 05/10/2017 1138   GLUCOSE 127 (H) 07/26/2012 0555   BUN 14 05/10/2017 1138   CREATININE 0.68 05/10/2017 1138   CALCIUM 9.2 05/10/2017 1138   GFRNONAA 92 05/10/2017 1138   GFRAA 106 05/10/2017 1138   Lab Results  Component Value Date   HGBA1C 7.5 (H) 05/10/2017   Lab Results  Component Value Date   INSULIN 49.2 (H) 05/10/2017   CBC    Component Value Date/Time   WBC 6.2 05/10/2017 1138   WBC 6.6 07/26/2012 0555   RBC 4.58 05/10/2017 1138   RBC 3.77 (L) 07/26/2012 0555   HGB 12.8 05/10/2017 1138   HGB 10.9 (L) 06/11/2009 1303   HCT 40.9 05/10/2017 1138   HCT 33.6 (L) 06/11/2009 1303   PLT 232 05/10/2017 1138   MCV 89 05/10/2017 1138   MCV 78.3 (L) 06/11/2009 1303   MCH 27.9 05/10/2017 1138   MCH 29.2 07/26/2012 0555   MCHC 31.3 (L) 05/10/2017 1138   MCHC 31.3 07/26/2012 0555   RDW 14.6 05/10/2017 1138   RDW 16.3 (H) 06/11/2009 1303   LYMPHSABS 1.3 05/10/2017 1138   LYMPHSABS 1.3 06/11/2009 1303   MONOABS 0.6 07/20/2012 0848   MONOABS 0.7 06/11/2009 1303   EOSABS 0.2 05/10/2017 1138   BASOSABS 0.0 05/10/2017 1138   BASOSABS 0.0 06/11/2009 1303   Iron/TIBC/Ferritin/ %Sat    Component Value Date/Time   IRON 31 (L) 06/11/2009 1303   TIBC 420 06/11/2009 1303   FERRITIN 9 (L) 06/11/2009 1303    IRONPCTSAT 7 (L) 06/11/2009 1303   Lipid Panel     Component Value Date/Time   CHOL 178 05/10/2017 1138   TRIG 151 (H) 05/10/2017 1138   HDL 50 05/10/2017 1138   LDLCALC 98 05/10/2017 1138   Hepatic Function Panel     Component Value Date/Time   PROT 6.4 05/10/2017 1138   ALBUMIN 4.0 05/10/2017 1138   AST 15 05/10/2017 1138   ALT 17 05/10/2017 1138   ALKPHOS 104 05/10/2017 1138   BILITOT 0.3 05/10/2017 1138      Component Value Date/Time   TSH 2.670 05/10/2017 1138    ASSESSMENT AND PLAN: Type 2 diabetes mellitus without complication, without long-term current use of insulin (HCC)  Vitamin D deficiency - Plan: Vitamin D, Ergocalciferol, (DRISDOL) 50000 units CAPS capsule  Class 3 severe obesity with serious comorbidity and body mass index (BMI) of 45.0 to 49.9 in adult, unspecified obesity type ([place])  PLAN:  Vitamin D Deficiency Khelani was informed that low vitamin D levels contributes to fatigue and are associated with obesity, breast, and colon cancer. She agrees to continue to take prescription Vit D '@50' ,000 IU every week #4 with no refills. We will recheck labs in 1 month and will follow up for routine testing of vitamin D, at least 2-3 times per year. She was informed of the risk of over-replacement of vitamin D and agrees to not increase her dose unless he discusses this  with [place] first. Jewelia agrees to follow up with our clinic in 2 weeks.  Diabetes II Amelianna has been given extensive diabetes education by myself today including ideal fasting and post-prandial blood glucose readings, individual ideal HgbA1c goals and hypoglycemia prevention. We discussed the importance of good blood sugar control to decrease the likelihood of diabetic complications such as nephropathy, neuropathy, limb loss, blindness, coronary artery disease, and death. We discussed the importance of intensive lifestyle modification including diet, exercise and weight loss as the first line treatment for  diabetes. Zaraya agrees to continue with diet and exercise and we will recheck labs in 1 month. Casidy agrees to continue metformin and will follow up at the agreed upon time.  Obesity Abbey is currently in the action stage of change. As such, her goal is to continue with weight loss efforts She has agreed to follow a lower carbohydrate, vegetable and lean protein rich diet plan Noretta has been instructed to work up to a goal of 150 minutes of combined cardio and strengthening exercise per week for weight loss and overall health benefits. We discussed the following Behavioral Modification Strategies today: increasing lean protein intake, decreasing simple carbohydrates , work on meal planning and easy cooking plans, travel eating strategies, celebration eating strategies and holiday eating strategies   Ernesteen has agreed to follow up with our clinic in 2 weeks. She was informed of the importance of frequent follow up visits to maximize her success with intensive lifestyle modifications for her multiple health conditions.  I, Doreene Nest, am acting as transcriptionist for Dennard Nip, MD  I have reviewed the above documentation for accuracy and completeness, and I agree with the above. -Dennard Nip, MD    OBESITY BEHAVIORAL INTERVENTION VISIT  Today's visit was # 6 out of 22.  Starting weight: 232 lbs Starting date: 05/10/17 Today's weight : 213 lbs Today's date: 08/02/2017 Total lbs lost to date: 19 (Patients must lose 7 lbs in the first 6 months to continue with counseling)   ASK: We discussed the diagnosis of obesity with Rosamaria Lints today and Valine agreed to give [place] permission to discuss obesity behavioral modification therapy today.  ASSESS: Kali has the diagnosis of obesity and her BMI today is 46.08 Denea is in the action stage of change   ADVISE: Mai was educated on the multiple health risks of obesity as well as the benefit of weight loss to improve her  health. She was advised of the need for long term treatment and the importance of lifestyle modifications.  AGREE: Multiple dietary modification options and treatment options were discussed and  Zeva agreed to follow a lower carbohydrate, vegetable and lean protein rich diet plan We discussed the following Behavioral Modification Strategies today: increasing lean protein intake, decreasing simple carbohydrates , work on meal planning and easy cooking plans, travel eating strategies, celebration eating strategies and holiday eating strategies

## 2017-08-11 ENCOUNTER — Ambulatory Visit (INDEPENDENT_AMBULATORY_CARE_PROVIDER_SITE_OTHER): Payer: Medicare Other | Admitting: Family Medicine

## 2017-08-11 VITALS — BP 123/74 | HR 71 | Temp 98.1°F | Ht <= 58 in | Wt 211.0 lb

## 2017-08-11 DIAGNOSIS — E119 Type 2 diabetes mellitus without complications: Secondary | ICD-10-CM | POA: Diagnosis not present

## 2017-08-11 DIAGNOSIS — E66813 Obesity, class 3: Secondary | ICD-10-CM

## 2017-08-11 DIAGNOSIS — Z6841 Body Mass Index (BMI) 40.0 and over, adult: Secondary | ICD-10-CM | POA: Diagnosis not present

## 2017-08-11 MED ORDER — METFORMIN HCL 500 MG PO TABS: 500.0000 mg | ORAL_TABLET | Freq: Two times a day (BID) | ORAL | 0 refills | Status: DC

## 2017-08-11 NOTE — Progress Notes (Signed)
Office: [phone]  /  Fax: [phone]   HPI:   Chief Complaint: OBESITY Quanna is here to discuss her progress with her obesity treatment plan. She is on the lower carbohydrate, vegetable and lean protein rich diet plan and is following her eating plan approximately 95 % of the time. She states she is exercising 0 minutes 0 times per week. Cande continues to do well with weight loss but notes increased hunger and increased snacking but is trying to make better choices. Her weight is 211 lb (95.7 kg) today and has had a weight loss of 2 pounds over a period of 1 week since her last visit. She has lost 21 lbs since starting treatment with [place].  Diabetes II Lakeesha has a diagnosis of diabetes type II. Gearl notes fasting BGs increased to between 140 and 150's while she was eating birthday cake 3 to 4 days in a row. She didn't bring in her blood sugar log today. Will denies any hypoglycemic episodes. She has been working on intensive lifestyle modifications including diet, exercise, and weight loss to help control her blood glucose levels.  ALLERGIES: Allergies  Allergen Reactions  . Iron Anaphylaxis    Iv iron  . Ciprofloxacin Nausea Only  . Contrast Media [Iodinated Diagnostic Agents] Itching and Rash  . Sulfa Antibiotics Itching and Rash  . Tape Rash    Adhesive tape    MEDICATIONS: Current Outpatient Prescriptions on File Prior to Visit  Medication Sig Dispense Refill  . atorvastatin (LIPITOR) 20 MG tablet Take 20 mg by mouth daily.    . busPIRone (BUSPAR) 30 MG tablet TAKE 1 TABLET BY MOUTH 3  TIMES DAILY 270 tablet 1  . esomeprazole (NEXIUM) 40 MG capsule TAKE 1 CAPSULE BY MOUTH TWO TIMES DAILY 180 capsule 1  . ferrous sulfate 325 (65 FE) MG tablet Take 325 mg by mouth daily.     Marland Kitchen FLOVENT HFA 220 MCG/ACT inhaler Inhale 1 puff into the lungs daily.   1  . Ibuprofen (ADVIL) 200 MG CAPS Take 2 capsules by mouth 3 (three) times daily as needed. For migraines    .  Interferon Beta-1b (BETASERON) 0.3 MG KIT injection INJECT 0.25 MG UNDER THE SKIN EVERY OTHER DAY AS DIRECTED 42 each 2  . levothyroxine (SYNTHROID, LEVOTHROID) 100 MCG tablet TK 1 T PO QD  0  . meclizine (ANTIVERT) 12.5 MG tablet TAKE 1 TABLET(12.5 MG) BY MOUTH THREE TIMES DAILY AS NEEDED FOR DIZZINESS 270 tablet 1  . metoprolol succinate (TOPROL-XL) 50 MG 24 hr tablet TAKE 1 TABLET BY MOUTH  DAILY 90 tablet 3  . ONE TOUCH ULTRA TEST test strip USE TO CHECK FASTING AND PRE-DINNER BLOOD GLUCOSE BID  12  . ONETOUCH DELICA LANCETS 10C MISC USE TO CHECK FASTING AND PRE-DINNER BLOOD GLUCOSE BID  12  . potassium citrate (UROCIT-K) 10 MEQ (1080 MG) SR tablet Take 10 mEq by mouth 3 (three) times daily with meals.    . ranitidine (ZANTAC) 75 MG tablet Take 75 mg by mouth at bedtime.    Marland Kitchen rOPINIRole (REQUIP) 1 MG tablet Take 1 tablet (1 mg total) by mouth 4 (four) times daily. 360 tablet 3  . sertraline (ZOLOFT) 100 MG tablet TAKE 1 AND 1/2 TABLETS BY  MOUTH DAILY 135 tablet 3  . traMADol (ULTRAM) 50 MG tablet Take 1 tablet (50 mg total) by mouth 3 (three) times daily as needed. 90 tablet 3  . traZODone (DESYREL) 100 MG tablet TAKE 1/2 TO 1 TABLET  BY  MOUTH NIGHTLY FOR SLEEP 90 tablet 1  . vitamin B-12 (CYANOCOBALAMIN) 100 MCG tablet Take 100 mcg by mouth daily.    . Vitamin D, Ergocalciferol, (DRISDOL) 50000 units CAPS capsule Take 1 capsule (50,000 Units total) by mouth every 7 (seven) days. 4 capsule 0   No current facility-administered medications on file prior to visit.     PAST MEDICAL HISTORY: Past Medical History:  Diagnosis Date  . Allergy   . Anxiety   . Asthma   . Back pain   . Chronic kidney disease    KIDNEY STONES WITH STENTS  . Depression   . Diabetes ([place])   . Diverticula of colon   . Dyspnea   . Eczema   . Food allergy   . Gallbladder problem   . GERD (gastroesophageal reflux disease)   . Hypercholesterolemia   . Hyperlipidemia   . Hypothyroidism   . Joint pain   .  Kidney stones   . Leg edema   . Migraine headache   . Multiple sclerosis ([place])   . Obesity   . Palpitations   . PONV (postoperative nausea and vomiting)   . Restless leg syndrome   . Right sided sciatica 10/29/2015  . Snoring   . Thyroid disease    hyperthyroidism  . Unsteady gait   . Vertigo   . Vision abnormalities     PAST SURGICAL HISTORY: Past Surgical History:  Procedure Laterality Date  . BREAST SURGERY     AUGMENTATION  . carpel tunnel    . CHOLECYSTECTOMY    . DILATION AND CURETTAGE OF UTERUS  1983  . FINGER SURGERY  x8  . HERNIA REPAIR    . LITHOTRIPSY    . NISSEN FUNDOPLICATION    . VENTRAL HERNIA REPAIR  07/25/2012   Procedure: LAPAROSCOPIC VENTRAL HERNIA;  Surgeon: Adin Hector, MD;  Location: [facility];  Service: General;  Laterality: N/A;    SOCIAL HISTORY: Social History  Substance Use Topics  . Smoking status: Former Research scientist (life sciences)  . Smokeless tobacco: Never Used  . Alcohol use No    FAMILY HISTORY: Family History  Problem Relation Age of Onset  . Hyperlipidemia Mother   . Glaucoma Mother   . Hypertension Mother   . Anxiety disorder Mother   . Depression Mother   . Heart disease Father   . Cancer Father        prostate  . Diabetes Father   . Dementia Father   . Hypertension Father   . Hyperlipidemia Father   . Sleep apnea Father   . Obesity Father   . Cancer Brother        esophageal  . Arthritis Sister        RA    ROS: Review of Systems  Constitutional: Positive for weight loss.    PHYSICAL EXAM: Blood pressure 123/74, pulse 71, temperature 98.1 F (36.7 C), temperature source Oral, height _0  (1.448 m), weight 211 lb (95.7 kg), SpO2 97 %. Body mass index is 45.66 kg/m. Physical Exam  Constitutional: She is oriented to person, place, and time. She appears well-developed and well-nourished.  Cardiovascular: Normal rate.   Pulmonary/Chest: Effort normal.  Musculoskeletal: Normal range of motion.  Neurological: She is oriented  to person, place, and time.  Skin: Skin is warm and dry.  Psychiatric: She has a normal mood and affect. Her behavior is normal.  Vitals reviewed.   RECENT LABS AND TESTS: BMET    Component Value Date/Time  NA 144 05/10/2017 1138   K 4.6 05/10/2017 1138   CL 103 05/10/2017 1138   CO2 26 05/10/2017 1138   GLUCOSE 138 (H) 05/10/2017 1138   GLUCOSE 127 (H) 07/26/2012 0555   BUN 14 05/10/2017 1138   CREATININE 0.68 05/10/2017 1138   CALCIUM 9.2 05/10/2017 1138   GFRNONAA 92 05/10/2017 1138   GFRAA 106 05/10/2017 1138   Lab Results  Component Value Date   HGBA1C 7.5 (H) 05/10/2017   Lab Results  Component Value Date   INSULIN 49.2 (H) 05/10/2017   CBC    Component Value Date/Time   WBC 6.2 05/10/2017 1138   WBC 6.6 07/26/2012 0555   RBC 4.58 05/10/2017 1138   RBC 3.77 (L) 07/26/2012 0555   HGB 12.8 05/10/2017 1138   HGB 10.9 (L) 06/11/2009 1303   HCT 40.9 05/10/2017 1138   HCT 33.6 (L) 06/11/2009 1303   PLT 232 05/10/2017 1138   MCV 89 05/10/2017 1138   MCV 78.3 (L) 06/11/2009 1303   MCH 27.9 05/10/2017 1138   MCH 29.2 07/26/2012 0555   MCHC 31.3 (L) 05/10/2017 1138   MCHC 31.3 07/26/2012 0555   RDW 14.6 05/10/2017 1138   RDW 16.3 (H) 06/11/2009 1303   LYMPHSABS 1.3 05/10/2017 1138   LYMPHSABS 1.3 06/11/2009 1303   MONOABS 0.6 07/20/2012 0848   MONOABS 0.7 06/11/2009 1303   EOSABS 0.2 05/10/2017 1138   BASOSABS 0.0 05/10/2017 1138   BASOSABS 0.0 06/11/2009 1303   Iron/TIBC/Ferritin/ %Sat    Component Value Date/Time   IRON 31 (L) 06/11/2009 1303   TIBC 420 06/11/2009 1303   FERRITIN 9 (L) 06/11/2009 1303   IRONPCTSAT 7 (L) 06/11/2009 1303   Lipid Panel     Component Value Date/Time   CHOL 178 05/10/2017 1138   TRIG 151 (H) 05/10/2017 1138   HDL 50 05/10/2017 1138   LDLCALC 98 05/10/2017 1138   Hepatic Function Panel     Component Value Date/Time   PROT 6.4 05/10/2017 1138   ALBUMIN 4.0 05/10/2017 1138   AST 15 05/10/2017 1138   ALT 17  05/10/2017 1138   ALKPHOS 104 05/10/2017 1138   BILITOT 0.3 05/10/2017 1138      Component Value Date/Time   TSH 2.670 05/10/2017 1138    ASSESSMENT AND PLAN: Type 2 diabetes mellitus without complication, without long-term current use of insulin ([place]) - Plan: metFORMIN (GLUCOPHAGE) 500 MG tablet  Class 3 severe obesity with serious comorbidity and body mass index (BMI) of 45.0 to 49.9 in adult, unspecified obesity type ([place])  PLAN:  Diabetes II Alencia has been given extensive diabetes education by myself today including ideal fasting and post-prandial blood glucose readings, individual ideal Hgb A1c goals  and hypoglycemia prevention. We discussed the importance of good blood sugar control to decrease the likelihood of diabetic complications such as nephropathy, neuropathy, limb loss, blindness, coronary artery disease, and death. We discussed the importance of intensive lifestyle modification including diet, exercise and weight loss as the first line treatment for diabetes. Floyd agrees to continue metformin 500 mg bid #60 with no refills and will continue with diet, exercise and weight loss. Nera agrees to follow up at the agreed upon time.  Obesity Jla is currently in the action stage of change. As such, her goal is to continue with weight loss efforts She has agreed to follow a lower carbohydrate, vegetable and lean protein rich diet plan or follow the Category 2 plan Signe has been instructed to work up  to a goal of 150 minutes of combined cardio and strengthening exercise per week for weight loss and overall health benefits. We discussed the following Behavioral Modification Strategies today: increasing lean protein intake, work on meal planning and easy cooking plans, dealing with family or coworker sabotage and holiday eating strategies   Tomiko has agreed to follow up with our clinic in 2 weeks. She was informed of the importance of frequent follow up visits to maximize her  success with intensive lifestyle modifications for her multiple health conditions.  I, Doreene Nest, am acting as transcriptionist for Dennard Nip, MD  I have reviewed the above documentation for accuracy and completeness, and I agree with the above. -Dennard Nip, MD    OBESITY BEHAVIORAL INTERVENTION VISIT  Today's visit was # 7 out of 22.  Starting weight: 232 lbs Starting date: 05/10/17 Today's weight : 211 lbs  Today's date: 08/11/2017 Total lbs lost to date: 21 (Patients must lose 7 lbs in the first 6 months to continue with counseling)   ASK: We discussed the diagnosis of obesity with Rosamaria Lints today and Ludean agreed to give [place] permission to discuss obesity behavioral modification therapy today.  ASSESS: Lenoir has the diagnosis of obesity and her BMI today is 45.65 Bedelia is in the action stage of change   ADVISE: Corynn was educated on the multiple health risks of obesity as well as the benefit of weight loss to improve her health. She was advised of the need for long term treatment and the importance of lifestyle modifications.  AGREE: Multiple dietary modification options and treatment options were discussed and  Allyah agreed to follow a lower carbohydrate, vegetable and lean protein rich diet plan or follow the Category 2 plan We discussed the following Behavioral Modification Strategies today: increasing lean protein intake, work on meal planning and easy cooking plans, dealing with family or coworker sabotage and holiday eating strategies

## 2017-08-26 ENCOUNTER — Other Ambulatory Visit: Payer: Self-pay | Admitting: Neurology

## 2017-08-29 ENCOUNTER — Ambulatory Visit (INDEPENDENT_AMBULATORY_CARE_PROVIDER_SITE_OTHER): Payer: Medicare Other | Admitting: Family Medicine

## 2017-08-29 VITALS — BP 112/69 | HR 75 | Temp 98.8°F | Ht <= 58 in | Wt 212.0 lb

## 2017-08-29 DIAGNOSIS — Z9189 Other specified personal risk factors, not elsewhere classified: Secondary | ICD-10-CM

## 2017-08-29 DIAGNOSIS — Z6841 Body Mass Index (BMI) 40.0 and over, adult: Secondary | ICD-10-CM | POA: Diagnosis not present

## 2017-08-29 DIAGNOSIS — E559 Vitamin D deficiency, unspecified: Secondary | ICD-10-CM | POA: Diagnosis not present

## 2017-08-29 MED ORDER — VITAMIN D (ERGOCALCIFEROL) 1.25 MG (50000 UNIT) PO CAPS: 50000.0000 [IU] | ORAL_CAPSULE | ORAL | 0 refills | Status: DC

## 2017-08-30 NOTE — Progress Notes (Signed)
Office: [phone]  /  Fax: [phone]   HPI:   Chief Complaint: OBESITY Jocelyn Wilson is here to discuss her progress with her obesity treatment plan. She is on lower carbohydrate, vegetable and lean protein rich diet plan or follow the Category 2 plan and is following her eating plan approximately 75 % of the time. She states she is exercising 0 minutes 0 times per week. Jocelyn Wilson has had increased emotional eating in the last few days. She is getting ready to have company for Thanksgiving and is not following the plan closely. She is mostly controlling her portions and making smarter food choices. Her weight is 212 lb (96.2 kg) today and has had a weight gain of 1 pound over a period of 2 weeks since her last visit. She has lost 20 lbs since starting treatment with [place].  Vitamin D deficiency Jocelyn Wilson has a diagnosis of vitamin D deficiency. She is currently taking vit D and denies nausea, vomiting or muscle weakness.  At risk for osteopenia and osteoporosis Jocelyn Wilson is at higher risk of osteopenia and osteoporosis due to vitamin D deficiency.   ALLERGIES: Allergies  Allergen Reactions  . Iron Anaphylaxis    Iv iron  . Ciprofloxacin Nausea Only  . Contrast Media [Iodinated Diagnostic Agents] Itching and Rash  . Sulfa Antibiotics Itching and Rash  . Tape Rash    Adhesive tape    MEDICATIONS: Current Outpatient Medications on File Prior to Visit  Medication Sig Dispense Refill  . atorvastatin (LIPITOR) 20 MG tablet Take 20 mg by mouth daily.    . busPIRone (BUSPAR) 30 MG tablet TAKE 1 TABLET BY MOUTH 3  TIMES DAILY 270 tablet 1  . esomeprazole (NEXIUM) 40 MG capsule TAKE 1 CAPSULE BY MOUTH TWO TIMES DAILY 180 capsule 1  . ferrous sulfate 325 (65 FE) MG tablet Take 325 mg by mouth daily.     Marland Kitchen FLOVENT HFA 220 MCG/ACT inhaler Inhale 1 puff into the lungs daily.   1  . Ibuprofen (ADVIL) 200 MG CAPS Take 2 capsules by mouth 3 (three) times daily as needed. For migraines    . Interferon  Beta-1b (BETASERON) 0.3 MG KIT injection INJECT 0.25 MG UNDER THE SKIN EVERY OTHER DAY AS DIRECTED 42 each 2  . levothyroxine (SYNTHROID, LEVOTHROID) 100 MCG tablet TK 1 T PO QD  0  . meclizine (ANTIVERT) 12.5 MG tablet TAKE 1 TABLET(12.5 MG) BY MOUTH THREE TIMES DAILY AS NEEDED FOR DIZZINESS 270 tablet 1  . metFORMIN (GLUCOPHAGE) 500 MG tablet Take 1 tablet (500 mg total) by mouth 2 (two) times daily at 10 AM and 5 PM. 60 tablet 0  . metoprolol succinate (TOPROL-XL) 50 MG 24 hr tablet TAKE 1 TABLET BY MOUTH  DAILY 90 tablet 3  . ONE TOUCH ULTRA TEST test strip USE TO CHECK FASTING AND PRE-DINNER BLOOD GLUCOSE BID  12  . ONETOUCH DELICA LANCETS 91B MISC USE TO CHECK FASTING AND PRE-DINNER BLOOD GLUCOSE BID  12  . potassium citrate (UROCIT-K) 10 MEQ (1080 MG) SR tablet Take 10 mEq by mouth 3 (three) times daily with meals.    . ranitidine (ZANTAC) 75 MG tablet Take 75 mg by mouth at bedtime.    Marland Kitchen rOPINIRole (REQUIP) 1 MG tablet TAKE 1 TABLET BY MOUTH 4  TIMES DAILY 360 tablet 3  . sertraline (ZOLOFT) 100 MG tablet TAKE 1 AND 1/2 TABLETS BY  MOUTH DAILY 135 tablet 3  . traMADol (ULTRAM) 50 MG tablet Take 1 tablet (50 mg  total) by mouth 3 (three) times daily as needed. 90 tablet 3  . traZODone (DESYREL) 100 MG tablet TAKE 1/2 TO 1 TABLET BY  MOUTH NIGHTLY FOR SLEEP 90 tablet 1  . vitamin B-12 (CYANOCOBALAMIN) 100 MCG tablet Take 100 mcg by mouth daily.     No current facility-administered medications on file prior to visit.     PAST MEDICAL HISTORY: Past Medical History:  Diagnosis Date  . Allergy   . Anxiety   . Asthma   . Back pain   . Chronic kidney disease    KIDNEY STONES WITH STENTS  . Depression   . Diabetes ([place])   . Diverticula of colon   . Dyspnea   . Eczema   . Food allergy   . Gallbladder problem   . GERD (gastroesophageal reflux disease)   . Hypercholesterolemia   . Hyperlipidemia   . Hypothyroidism   . Joint pain   . Kidney stones   . Leg edema   . Migraine  headache   . Multiple sclerosis ([place])   . Obesity   . Palpitations   . PONV (postoperative nausea and vomiting)   . Restless leg syndrome   . Right sided sciatica 10/29/2015  . Snoring   . Thyroid disease    hyperthyroidism  . Unsteady gait   . Vertigo   . Vision abnormalities     PAST SURGICAL HISTORY: Past Surgical History:  Procedure Laterality Date  . BREAST SURGERY     AUGMENTATION  . carpel tunnel    . CHOLECYSTECTOMY    . DILATION AND CURETTAGE OF UTERUS  1983  . FINGER SURGERY  x8  . HERNIA REPAIR    . INSERTION OF MESH N/A 07/25/2012   Performed by Adin Hector, MD at [facility]  . LAPAROSCOPIC VENTRAL HERNIA N/A 07/25/2012   Performed by Adin Hector, MD at [facility] OR  . LITHOTRIPSY    . NISSEN FUNDOPLICATION      SOCIAL HISTORY: Social History   Tobacco Use  . Smoking status: Former Research scientist (life sciences)  . Smokeless tobacco: Never Used  Substance Use Topics  . Alcohol use: No  . Drug use: No    FAMILY HISTORY: Family History  Problem Relation Age of Onset  . Hyperlipidemia Mother   . Glaucoma Mother   . Hypertension Mother   . Anxiety disorder Mother   . Depression Mother   . Heart disease Father   . Cancer Father        prostate  . Diabetes Father   . Dementia Father   . Hypertension Father   . Hyperlipidemia Father   . Sleep apnea Father   . Obesity Father   . Cancer Brother        esophageal  . Arthritis Sister        RA    ROS: Review of Systems  Constitutional: Negative for weight loss.  Gastrointestinal: Negative for nausea and vomiting.  Musculoskeletal:       Negative muscle weakness    PHYSICAL EXAM: Blood pressure 112/69, pulse 75, temperature 98.8 F (37.1 C), temperature source Oral, height '4\' 9"'  (1.448 m), weight 212 lb (96.2 kg), SpO2 95 %. Body mass index is 45.88 kg/m. Physical Exam  Constitutional: She is oriented to person, place, and time. She appears well-developed and well-nourished.  Cardiovascular: Normal rate.    Pulmonary/Chest: Effort normal.  Musculoskeletal: Normal range of motion.  Neurological: She is oriented to person, place, and time.  Skin: Skin is  warm and dry.  Psychiatric: She has a normal mood and affect. Her behavior is normal.  Vitals reviewed.   RECENT LABS AND TESTS: BMET    Component Value Date/Time   NA 144 05/10/2017 1138   K 4.6 05/10/2017 1138   CL 103 05/10/2017 1138   CO2 26 05/10/2017 1138   GLUCOSE 138 (H) 05/10/2017 1138   GLUCOSE 127 (H) 07/26/2012 0555   BUN 14 05/10/2017 1138   CREATININE 0.68 05/10/2017 1138   CALCIUM 9.2 05/10/2017 1138   GFRNONAA 92 05/10/2017 1138   GFRAA 106 05/10/2017 1138   Lab Results  Component Value Date   HGBA1C 7.5 (H) 05/10/2017   Lab Results  Component Value Date   INSULIN 49.2 (H) 05/10/2017   CBC    Component Value Date/Time   WBC 6.2 05/10/2017 1138   WBC 6.6 07/26/2012 0555   RBC 4.58 05/10/2017 1138   RBC 3.77 (L) 07/26/2012 0555   HGB 12.8 05/10/2017 1138   HGB 10.9 (L) 06/11/2009 1303   HCT 40.9 05/10/2017 1138   HCT 33.6 (L) 06/11/2009 1303   PLT 232 05/10/2017 1138   MCV 89 05/10/2017 1138   MCV 78.3 (L) 06/11/2009 1303   MCH 27.9 05/10/2017 1138   MCH 29.2 07/26/2012 0555   MCHC 31.3 (L) 05/10/2017 1138   MCHC 31.3 07/26/2012 0555   RDW 14.6 05/10/2017 1138   RDW 16.3 (H) 06/11/2009 1303   LYMPHSABS 1.3 05/10/2017 1138   LYMPHSABS 1.3 06/11/2009 1303   MONOABS 0.6 07/20/2012 0848   MONOABS 0.7 06/11/2009 1303   EOSABS 0.2 05/10/2017 1138   BASOSABS 0.0 05/10/2017 1138   BASOSABS 0.0 06/11/2009 1303   Iron/TIBC/Ferritin/ %Sat    Component Value Date/Time   IRON 31 (L) 06/11/2009 1303   TIBC 420 06/11/2009 1303   FERRITIN 9 (L) 06/11/2009 1303   IRONPCTSAT 7 (L) 06/11/2009 1303   Lipid Panel     Component Value Date/Time   CHOL 178 05/10/2017 1138   TRIG 151 (H) 05/10/2017 1138   HDL 50 05/10/2017 1138   LDLCALC 98 05/10/2017 1138   Hepatic Function Panel     Component Value  Date/Time   PROT 6.4 05/10/2017 1138   ALBUMIN 4.0 05/10/2017 1138   AST 15 05/10/2017 1138   ALT 17 05/10/2017 1138   ALKPHOS 104 05/10/2017 1138   BILITOT 0.3 05/10/2017 1138      Component Value Date/Time   TSH 2.670 05/10/2017 1138    ASSESSMENT AND PLAN: Vitamin D deficiency - Plan: Vitamin D, Ergocalciferol, (DRISDOL) 50000 units CAPS capsule  At risk for osteoporosis  Class 3 severe obesity with serious comorbidity and body mass index (BMI) of 45.0 to 49.9 in adult, unspecified obesity type ([place])  PLAN:  Vitamin D Deficiency Jocelyn Wilson was informed that low vitamin D levels contributes to fatigue and are associated with obesity, breast, and colon cancer. She agrees to continue to take prescription Vit D '@50' ,000 IU every week #4 with no refills and will follow up for routine testing of vitamin D, at least 2-3 times per year. She was informed of the risk of over-replacement of vitamin D and agrees to not increase her dose unless he discusses this with [place] first. Jocelyn Wilson agrees to follow up with our clinic in 2 to 3 weeks.  At risk for osteopenia and osteoporosis Jocelyn Wilson is at risk for osteopenia and osteoporosis due to her vitamin D deficiency. She was encouraged to take her vitamin D and follow her higher  calcium diet and increase strengthening exercise to help strengthen her bones and decrease her risk of osteopenia and osteoporosis.  Obesity Jocelyn Wilson is currently in the action stage of change. As such, her goal is to continue with weight loss efforts She has agreed to follow the Category 2 plan after Thanksgiving Jocelyn Wilson has been instructed to work up to a goal of 150 minutes of combined cardio and strengthening exercise per week for weight loss and overall health benefits. We discussed the following Behavioral Modification Strategies today: increasing lean protein intake, decreasing simple carbohydrates , dealing with family or coworker sabotage and holiday eating strategies    Jocelyn Wilson has agreed to follow up with our clinic in 2 to 3 weeks. She was informed of the importance of frequent follow up visits to maximize her success with intensive lifestyle modifications for her multiple health conditions.  I, Doreene Nest, am acting as transcriptionist for Dennard Nip, MD  I have reviewed the above documentation for accuracy and completeness, and I agree with the above. -Dennard Nip, MD   OBESITY BEHAVIORAL INTERVENTION VISIT  Today's visit was # 8 out of 22.  Starting weight: 232 lbs Starting date: 05/10/17 Today's weight : 212 lbs  Today's date: 08/29/2017 Total lbs lost to date: 20 (Patients must lose 7 lbs in the first 6 months to continue with counseling)   ASK: We discussed the diagnosis of obesity with Jocelyn Wilson today and Jocelyn Wilson agreed to give [place] permission to discuss obesity behavioral modification therapy today.  ASSESS: Jocelyn Wilson has the diagnosis of obesity and her BMI today is 45.86 Jocelyn Wilson is in the action stage of change   ADVISE: Jocelyn Wilson was educated on the multiple health risks of obesity as well as the benefit of weight loss to improve her health. She was advised of the need for long term treatment and the importance of lifestyle modifications.  AGREE: Multiple dietary modification options and treatment options were discussed and  Jocelyn Wilson agreed to follow the Category 2 plan after Thanksgiving We discussed the following Behavioral Modification Strategies today: increasing lean protein intake, decreasing simple carbohydrates , dealing with family or coworker sabotage and holiday eating strategies

## 2017-08-31 ENCOUNTER — Telehealth: Payer: Self-pay | Admitting: Neurology

## 2017-08-31 NOTE — Telephone Encounter (Signed)
error 

## 2017-09-07 ENCOUNTER — Other Ambulatory Visit (INDEPENDENT_AMBULATORY_CARE_PROVIDER_SITE_OTHER): Payer: Self-pay | Admitting: Family Medicine

## 2017-09-07 DIAGNOSIS — E119 Type 2 diabetes mellitus without complications: Secondary | ICD-10-CM

## 2017-09-14 ENCOUNTER — Ambulatory Visit (INDEPENDENT_AMBULATORY_CARE_PROVIDER_SITE_OTHER): Payer: Medicare Other | Admitting: Family Medicine

## 2017-09-14 VITALS — BP 129/77 | HR 64 | Temp 97.9°F | Ht <= 58 in | Wt 209.0 lb

## 2017-09-14 DIAGNOSIS — Z6841 Body Mass Index (BMI) 40.0 and over, adult: Secondary | ICD-10-CM | POA: Diagnosis not present

## 2017-09-14 DIAGNOSIS — E038 Other specified hypothyroidism: Secondary | ICD-10-CM | POA: Diagnosis not present

## 2017-09-14 DIAGNOSIS — E559 Vitamin D deficiency, unspecified: Secondary | ICD-10-CM

## 2017-09-14 DIAGNOSIS — E119 Type 2 diabetes mellitus without complications: Secondary | ICD-10-CM

## 2017-09-14 DIAGNOSIS — E7849 Other hyperlipidemia: Secondary | ICD-10-CM | POA: Diagnosis not present

## 2017-09-14 NOTE — Progress Notes (Signed)
Office: [phone]  /  Fax: [phone]   HPI:   Chief Complaint: OBESITY Jocelyn Wilson is here to discuss her progress with her obesity treatment plan. She is on the Category 2 plan and is following her eating plan approximately 85 % of the time. She states she is exercising 0 minutes 0 times per week. Jocelyn Wilson continues to do well with weight loss even over Thanksgiving. She goes between low carbohydrates and Category 2 plan, she is eating mindfully and she is not skipping meals.  Her weight is 209 lb (94.8 kg) today and has had a weight loss of 3 pounds over a period of 2 weeks since her last visit. She has lost 23 lbs since starting treatment with [place].  Diabetes II Jocelyn Wilson has a diagnosis of diabetes type II. Jocelyn Wilson states she is not checking BGs at home, she is due for labs. She denies any hypoglycemic episodes. Last A1c was 7.5 on 05/10/17. She has been working on intensive lifestyle modifications including diet, exercise, and weight loss to help control her blood glucose levels.  Hyperlipidemia Jocelyn Wilson has hyperlipidemia and has been trying to improve her cholesterol levels with intensive lifestyle modification including a low saturated fat diet, exercise and weight loss. She is on lipitor and she denies any chest pain, claudication or myalgias.  Vitamin D deficiency Jocelyn Wilson has a diagnosis of vitamin D deficiency. She is on prescription Vit D, but not yet at goal. She denies nausea, vomiting or muscle weakness.  Hypothyroid Jocelyn Wilson has a diagnosis of hypothyroidism. She is on levothyroxine. She denies hot or cold intolerance or palpitations, or fatigue.  ALLERGIES: Allergies  Allergen Reactions  . Iron Anaphylaxis    Iv iron  . Ciprofloxacin Nausea Only  . Contrast Media [Iodinated Diagnostic Agents] Itching and Rash  . Sulfa Antibiotics Itching and Rash  . Tape Rash    Adhesive tape    MEDICATIONS: Current Outpatient Medications on File Prior to Visit  Medication Sig Dispense  Refill  . atorvastatin (LIPITOR) 20 MG tablet Take 20 mg by mouth daily.    . busPIRone (BUSPAR) 30 MG tablet TAKE 1 TABLET BY MOUTH 3  TIMES DAILY 270 tablet 1  . esomeprazole (NEXIUM) 40 MG capsule TAKE 1 CAPSULE BY MOUTH TWO TIMES DAILY 180 capsule 1  . ferrous sulfate 325 (65 FE) MG tablet Take 325 mg by mouth daily.     Marland Kitchen FLOVENT HFA 220 MCG/ACT inhaler Inhale 1 puff into the lungs daily.   1  . Ibuprofen (ADVIL) 200 MG CAPS Take 2 capsules by mouth 3 (three) times daily as needed. For migraines    . Interferon Beta-1b (BETASERON) 0.3 MG KIT injection INJECT 0.25 MG UNDER THE SKIN EVERY OTHER DAY AS DIRECTED 42 each 2  . levothyroxine (SYNTHROID, LEVOTHROID) 100 MCG tablet TK 1 T PO QD  0  . meclizine (ANTIVERT) 12.5 MG tablet TAKE 1 TABLET(12.5 MG) BY MOUTH THREE TIMES DAILY AS NEEDED FOR DIZZINESS 270 tablet 1  . metFORMIN (GLUCOPHAGE) 500 MG tablet TAKE 1 TABLET(500 MG) BY MOUTH TWICE DAILY AT 10 AM AND AT 5 PM 60 tablet 0  . metoprolol succinate (TOPROL-XL) 50 MG 24 hr tablet TAKE 1 TABLET BY MOUTH  DAILY 90 tablet 3  . ONE TOUCH ULTRA TEST test strip USE TO CHECK FASTING AND PRE-DINNER BLOOD GLUCOSE BID  12  . ONETOUCH DELICA LANCETS 35K MISC USE TO CHECK FASTING AND PRE-DINNER BLOOD GLUCOSE BID  12  . potassium citrate (UROCIT-K) 10 MEQ (1080  MG) SR tablet Take 10 mEq by mouth 3 (three) times daily with meals.    . ranitidine (ZANTAC) 75 MG tablet Take 75 mg by mouth at bedtime.    Marland Kitchen rOPINIRole (REQUIP) 1 MG tablet TAKE 1 TABLET BY MOUTH 4  TIMES DAILY 360 tablet 3  . sertraline (ZOLOFT) 100 MG tablet TAKE 1 AND 1/2 TABLETS BY  MOUTH DAILY 135 tablet 3  . traMADol (ULTRAM) 50 MG tablet Take 1 tablet (50 mg total) by mouth 3 (three) times daily as needed. 90 tablet 3  . traZODone (DESYREL) 100 MG tablet TAKE 1/2 TO 1 TABLET BY  MOUTH NIGHTLY FOR SLEEP 90 tablet 1  . vitamin B-12 (CYANOCOBALAMIN) 100 MCG tablet Take 100 mcg by mouth daily.    . Vitamin D, Ergocalciferol, (DRISDOL) 50000  units CAPS capsule Take 1 capsule (50,000 Units total) every 7 (seven) days by mouth. 4 capsule 0   No current facility-administered medications on file prior to visit.     PAST MEDICAL HISTORY: Past Medical History:  Diagnosis Date  . Allergy   . Anxiety   . Asthma   . Back pain   . Chronic kidney disease    KIDNEY STONES WITH STENTS  . Depression   . Diabetes ([place])   . Diverticula of colon   . Dyspnea   . Eczema   . Food allergy   . Gallbladder problem   . GERD (gastroesophageal reflux disease)   . Hypercholesterolemia   . Hyperlipidemia   . Hypothyroidism   . Joint pain   . Kidney stones   . Leg edema   . Migraine headache   . Multiple sclerosis ([place])   . Obesity   . Palpitations   . PONV (postoperative nausea and vomiting)   . Restless leg syndrome   . Right sided sciatica 10/29/2015  . Snoring   . Thyroid disease    hyperthyroidism  . Unsteady gait   . Vertigo   . Vision abnormalities     PAST SURGICAL HISTORY: Past Surgical History:  Procedure Laterality Date  . BREAST SURGERY     AUGMENTATION  . carpel tunnel    . CHOLECYSTECTOMY    . DILATION AND CURETTAGE OF UTERUS  1983  . FINGER SURGERY  x8  . HERNIA REPAIR    . LITHOTRIPSY    . NISSEN FUNDOPLICATION    . VENTRAL HERNIA REPAIR  07/25/2012   Procedure: LAPAROSCOPIC VENTRAL HERNIA;  Surgeon: Adin Hector, MD;  Location: [facility];  Service: General;  Laterality: N/A;    SOCIAL HISTORY: Social History   Tobacco Use  . Smoking status: Former Research scientist (life sciences)  . Smokeless tobacco: Never Used  Substance Use Topics  . Alcohol use: No  . Drug use: No    FAMILY HISTORY: Family History  Problem Relation Age of Onset  . Hyperlipidemia Mother   . Glaucoma Mother   . Hypertension Mother   . Anxiety disorder Mother   . Depression Mother   . Heart disease Father   . Cancer Father        prostate  . Diabetes Father   . Dementia Father   . Hypertension Father   . Hyperlipidemia Father   . Sleep  apnea Father   . Obesity Father   . Cancer Brother        esophageal  . Arthritis Sister        RA    ROS: Review of Systems  Constitutional: Positive for weight loss. Negative  for malaise/fatigue.       Negative hot/cold intolerance  Cardiovascular: Negative for chest pain, palpitations and claudication.  Gastrointestinal: Negative for nausea and vomiting.  Musculoskeletal: Negative for myalgias.       Negative muscle weakness  Endo/Heme/Allergies:       Negative hypoglycemia    PHYSICAL EXAM: Blood pressure 129/77, pulse 64, temperature 97.9 F (36.6 C), temperature source Oral, height '4\' 9"'  (1.448 m), weight 209 lb (94.8 kg), SpO2 96 %. Body mass index is 45.23 kg/m. Physical Exam  Constitutional: She is oriented to person, place, and time. She appears well-developed and well-nourished.  Cardiovascular: Normal rate.  Pulmonary/Chest: Effort normal.  Musculoskeletal: Normal range of motion.  Neurological: She is oriented to person, place, and time.  Skin: Skin is warm and dry.  Psychiatric: She has a normal mood and affect. Her behavior is normal.  Vitals reviewed.   RECENT LABS AND TESTS: BMET    Component Value Date/Time   NA 144 05/10/2017 1138   K 4.6 05/10/2017 1138   CL 103 05/10/2017 1138   CO2 26 05/10/2017 1138   GLUCOSE 138 (H) 05/10/2017 1138   GLUCOSE 127 (H) 07/26/2012 0555   BUN 14 05/10/2017 1138   CREATININE 0.68 05/10/2017 1138   CALCIUM 9.2 05/10/2017 1138   GFRNONAA 92 05/10/2017 1138   GFRAA 106 05/10/2017 1138   Lab Results  Component Value Date   HGBA1C 7.5 (H) 05/10/2017   Lab Results  Component Value Date   INSULIN 49.2 (H) 05/10/2017   CBC    Component Value Date/Time   WBC 6.2 05/10/2017 1138   WBC 6.6 07/26/2012 0555   RBC 4.58 05/10/2017 1138   RBC 3.77 (L) 07/26/2012 0555   HGB 12.8 05/10/2017 1138   HGB 10.9 (L) 06/11/2009 1303   HCT 40.9 05/10/2017 1138   HCT 33.6 (L) 06/11/2009 1303   PLT 232 05/10/2017 1138    MCV 89 05/10/2017 1138   MCV 78.3 (L) 06/11/2009 1303   MCH 27.9 05/10/2017 1138   MCH 29.2 07/26/2012 0555   MCHC 31.3 (L) 05/10/2017 1138   MCHC 31.3 07/26/2012 0555   RDW 14.6 05/10/2017 1138   RDW 16.3 (H) 06/11/2009 1303   LYMPHSABS 1.3 05/10/2017 1138   LYMPHSABS 1.3 06/11/2009 1303   MONOABS 0.6 07/20/2012 0848   MONOABS 0.7 06/11/2009 1303   EOSABS 0.2 05/10/2017 1138   BASOSABS 0.0 05/10/2017 1138   BASOSABS 0.0 06/11/2009 1303   Iron/TIBC/Ferritin/ %Sat    Component Value Date/Time   IRON 31 (L) 06/11/2009 1303   TIBC 420 06/11/2009 1303   FERRITIN 9 (L) 06/11/2009 1303   IRONPCTSAT 7 (L) 06/11/2009 1303   Lipid Panel     Component Value Date/Time   CHOL 178 05/10/2017 1138   TRIG 151 (H) 05/10/2017 1138   HDL 50 05/10/2017 1138   LDLCALC 98 05/10/2017 1138   Hepatic Function Panel     Component Value Date/Time   PROT 6.4 05/10/2017 1138   ALBUMIN 4.0 05/10/2017 1138   AST 15 05/10/2017 1138   ALT 17 05/10/2017 1138   ALKPHOS 104 05/10/2017 1138   BILITOT 0.3 05/10/2017 1138      Component Value Date/Time   TSH 2.670 05/10/2017 1138    ASSESSMENT AND PLAN: Type 2 diabetes mellitus without complication, without long-term current use of insulin (HCC) - Plan: Comprehensive metabolic panel, Hemoglobin A1c, Insulin, random, Microalbumin / creatinine urine ratio  Other hyperlipidemia - Plan: Lipid Panel With LDL/HDL Ratio  Vitamin D deficiency - Plan: VITAMIN D 25 Hydroxy (Vit-D Deficiency, Fractures)  Other specified hypothyroidism - Plan: T3, T4, free, TSH  Class 3 severe obesity with serious comorbidity and body mass index (BMI) of 45.0 to 49.9 in adult, unspecified obesity type ([place])  PLAN:  Diabetes II Dakisha has been given extensive diabetes education by myself today including ideal fasting and post-prandial blood glucose readings, individual ideal Hgb A1c goals and hypoglycemia prevention. We discussed the importance of good blood sugar control  to decrease the likelihood of diabetic complications such as nephropathy, neuropathy, limb loss, blindness, coronary artery disease, and death. We discussed the importance of intensive lifestyle modification including diet, exercise and weight loss as the first line treatment for diabetes. We will check labs and Jocelyn Wilson agrees to continue her diabetes medications as prescribed and she will follow up with our clinic in 2 weeks.  Hyperlipidemia Jocelyn Wilson was informed of the American Heart Association Guidelines emphasizing intensive lifestyle modifications as the first line treatment for hyperlipidemia. We discussed many lifestyle modifications today in depth, and Jocelyn Wilson will continue to work on decreasing saturated fats such as fatty red meat, butter and many fried foods. She will also increase vegetables and lean protein in her diet and continue to work on diet, exercise, and weight loss efforts. We will check labs and Jocelyn Wilson agrees to follow up with our clinic in 2 weeks.  Vitamin D Deficiency Jocelyn Wilson was informed that low vitamin D levels contributes to fatigue and are associated with obesity, breast, and colon cancer. Jocelyn Wilson agrees to continue taking prescription Vit D '@50' ,000 IU every week#4 and will follow up for routine testing of vitamin D, at least 2-3 times per year. She was informed of the risk of over-replacement of vitamin D and agrees to not increase her dose unless he discusses this with [place] first. We will check labs and Jocelyn Wilson agrees to follow up with our clinic in 2 weeks.  Hypothyroid Jocelyn Wilson was informed of the importance of good thyroid control to help with weight loss efforts. She was also informed that supertheraputic thyroid levels are dangerous and will not improve weight loss results. We will check labs and Jocelyn Wilson agrees to follow up with our clinic in 2 weeks.  Obesity Jocelyn Wilson is currently in the action stage of change. As such, her goal is to continue with weight loss efforts She has  agreed to follow the Category 2 plan and follow our protein rich vegetarian plan Jocelyn Wilson has been instructed to work up to a goal of 150 minutes of combined cardio and strengthening exercise per week for weight loss and overall health benefits. We discussed the following Behavioral Modification Strategies today: increasing lean protein intake, decreasing simple carbohydrates, increasing vegetables,  holiday eating strategies, and no skipping meals    Jocelyn Wilson has agreed to follow up with our clinic in 2 weeks. She was informed of the importance of frequent follow up visits to maximize her success with intensive lifestyle modifications for her multiple health conditions.  I, Trixie Dredge, am acting as transcriptionist for Dennard Nip, MD  I have reviewed the above documentation for accuracy and completeness, and I agree with the above. -Dennard Nip, MD     Today's visit was # 9 out of 22.  Starting weight: 232 lbs Starting date: 05/10/17 Today's weight : 209 lbs  Today's date: 09/14/2017 Total lbs lost to date: 23 (Patients must lose 7 lbs in the first 6 months to continue with counseling)   ASK: We discussed  the diagnosis of obesity with Jocelyn Wilson today and Jocelyn Wilson agreed to give [place] permission to discuss obesity behavioral modification therapy today.  ASSESS: Jocelyn Wilson has the diagnosis of obesity and her BMI today is 45.21 Jocelyn Wilson is in the action stage of change   ADVISE: Jocelyn Wilson was educated on the multiple health risks of obesity as well as the benefit of weight loss to improve her health. She was advised of the need for long term treatment and the importance of lifestyle modifications.  AGREE: Multiple dietary modification options and treatment options were discussed and  Jocelyn Wilson agreed to follow the Category 2 plan and follow our protein rich vegetarian plan We discussed the following Behavioral Modification Strategies today: increasing lean protein intake, decreasing simple  carbohydrates, increasing vegetables, holiday eating strategies, and no skipping meals

## 2017-09-15 LAB — T3: T3, Total: 71 ng/dL (ref 71–180)

## 2017-09-15 LAB — COMPREHENSIVE METABOLIC PANEL
ALBUMIN: 4.1 g/dL (ref 3.6–4.8)
ALT: 17 IU/L (ref 0–32)
AST: 16 IU/L (ref 0–40)
Albumin/Globulin Ratio: 1.9 (ref 1.2–2.2)
Alkaline Phosphatase: 80 IU/L (ref 39–117)
BUN / CREAT RATIO: 21 (ref 12–28)
BUN: 15 mg/dL (ref 8–27)
Bilirubin Total: 0.3 mg/dL (ref 0.0–1.2)
CALCIUM: 9.1 mg/dL (ref 8.7–10.3)
CO2: 23 mmol/L (ref 20–29)
CREATININE: 0.71 mg/dL (ref 0.57–1.00)
Chloride: 103 mmol/L (ref 96–106)
GFR, EST AFRICAN AMERICAN: 103 mL/min/{1.73_m2} (ref >59)
GFR, EST NON AFRICAN AMERICAN: 89 mL/min/{1.73_m2} (ref >59)
GLOBULIN, TOTAL: 2.2 g/dL (ref 1.5–4.5)
GLUCOSE: 126 mg/dL — AB (ref 65–99)
Potassium: 4.6 mmol/L (ref 3.5–5.2)
SODIUM: 142 mmol/L (ref 134–144)
TOTAL PROTEIN: 6.3 g/dL (ref 6.0–8.5)

## 2017-09-15 LAB — LIPID PANEL WITH LDL/HDL RATIO
CHOLESTEROL TOTAL: 162 mg/dL (ref 100–199)
HDL: 42 mg/dL (ref >39)
LDL CALC: 97 mg/dL (ref 0–99)
LDL/HDL RATIO: 2.3 ratio (ref 0.0–3.2)
TRIGLYCERIDES: 114 mg/dL (ref 0–149)
VLDL Cholesterol Cal: 23 mg/dL (ref 5–40)

## 2017-09-15 LAB — MICROALBUMIN / CREATININE URINE RATIO
Creatinine, Urine: 123.3 mg/dL
MICROALBUM., U, RANDOM: 17.1 ug/mL
Microalb/Creat Ratio: 13.9 mg/g creat (ref 0.0–30.0)

## 2017-09-15 LAB — HEMOGLOBIN A1C
Est. average glucose Bld gHb Est-mCnc: 140 mg/dL
HEMOGLOBIN A1C: 6.5 % — AB (ref 4.8–5.6)

## 2017-09-15 LAB — T4, FREE: FREE T4: 1.4 ng/dL (ref 0.82–1.77)

## 2017-09-15 LAB — VITAMIN D 25 HYDROXY (VIT D DEFICIENCY, FRACTURES): Vit D, 25-Hydroxy: 54.3 ng/mL (ref 30.0–100.0)

## 2017-09-15 LAB — INSULIN, RANDOM: INSULIN: 26.4 u[IU]/mL — ABNORMAL HIGH (ref 2.6–24.9)

## 2017-09-15 LAB — TSH: TSH: 3.43 u[IU]/mL (ref 0.450–4.500)

## 2017-09-23 ENCOUNTER — Other Ambulatory Visit (INDEPENDENT_AMBULATORY_CARE_PROVIDER_SITE_OTHER): Payer: Self-pay | Admitting: Family Medicine

## 2017-09-23 DIAGNOSIS — E559 Vitamin D deficiency, unspecified: Secondary | ICD-10-CM

## 2017-09-28 ENCOUNTER — Ambulatory Visit (INDEPENDENT_AMBULATORY_CARE_PROVIDER_SITE_OTHER): Payer: Medicare Other | Admitting: Family Medicine

## 2017-09-28 VITALS — BP 119/59 | HR 74 | Temp 98.5°F | Ht <= 58 in | Wt 209.0 lb

## 2017-09-28 DIAGNOSIS — E669 Obesity, unspecified: Secondary | ICD-10-CM | POA: Diagnosis not present

## 2017-09-28 DIAGNOSIS — E559 Vitamin D deficiency, unspecified: Secondary | ICD-10-CM | POA: Diagnosis not present

## 2017-09-28 DIAGNOSIS — Z9189 Other specified personal risk factors, not elsewhere classified: Secondary | ICD-10-CM | POA: Diagnosis not present

## 2017-09-28 DIAGNOSIS — Z6831 Body mass index (BMI) 31.0-31.9, adult: Secondary | ICD-10-CM

## 2017-09-28 DIAGNOSIS — E119 Type 2 diabetes mellitus without complications: Secondary | ICD-10-CM | POA: Diagnosis not present

## 2017-09-28 MED ORDER — VITAMIN D (ERGOCALCIFEROL) 1.25 MG (50000 UNIT) PO CAPS: 50000.0000 [IU] | ORAL_CAPSULE | ORAL | 0 refills | Status: DC

## 2017-09-28 NOTE — Progress Notes (Signed)
Office: [phone]  /  Fax: [phone]   HPI:   Chief Complaint: OBESITY Jocelyn Wilson is here to discuss her progress with her obesity treatment plan. She is on the Category 2 plan and follow our protein rich vegetarian plan and is following her eating plan approximately 50 % of the time. She states she is exercising 0 minutes 0 times per week. [name] has done well maintaining her weight. She is doing well with portion control and making smarter food choices and she is eating more mindfully.  Her weight is 209 lb (94.8 kg) today and has not lost weight since her last visit. She has lost 23 lbs since starting treatment with Jocelyn Wilson.  Diabetes II Jocelyn Wilson has a diagnosis of diabetes type II. Jocelyn Wilson is on metformin BID and A1c has improved from 7.5 to 6.5. She denies any hypoglycemic episodes. She has been working on intensive lifestyle modifications including diet, exercise, and weight loss to help control her blood glucose levels.  At risk for cardiovascular disease Jocelyn Wilson is at a higher than average risk for cardiovascular disease due to obesity and diabetes II. She currently denies any chest pain.  Vitamin D deficiency Jocelyn Wilson has a diagnosis of vitamin D deficiency. She is currently taking prescription Vit D, improving and now at goal. She notes fatigue is improving and denies nausea, vomiting or muscle weakness.  ALLERGIES: Allergies  Allergen Reactions  . Iron Anaphylaxis    Iv iron  . Ciprofloxacin Nausea Only  . Contrast Media [Iodinated Diagnostic Agents] Itching and Rash  . Sulfa Antibiotics Itching and Rash  . Tape Rash    Adhesive tape    MEDICATIONS: Current Outpatient Medications on File Prior to Visit  Medication Sig Dispense Refill  . atorvastatin (LIPITOR) 20 MG tablet Take 20 mg by mouth daily.    . busPIRone (BUSPAR) 30 MG tablet TAKE 1 TABLET BY MOUTH 3  TIMES DAILY 270 tablet 1  . esomeprazole (NEXIUM) 40 MG capsule TAKE 1 CAPSULE BY MOUTH TWO TIMES DAILY 180 capsule  1  . ferrous sulfate 325 (65 FE) MG tablet Take 325 mg by mouth daily.     Marland Kitchen FLOVENT HFA 220 MCG/ACT inhaler Inhale 1 puff into the lungs daily.   1  . Ibuprofen (ADVIL) 200 MG CAPS Take 2 capsules by mouth 3 (three) times daily as needed. For migraines    . Interferon Beta-1b (BETASERON) 0.3 MG KIT injection INJECT 0.25 MG UNDER THE SKIN EVERY OTHER DAY AS DIRECTED 42 each 2  . levothyroxine (SYNTHROID, LEVOTHROID) 100 MCG tablet TK 1 T PO QD  0  . meclizine (ANTIVERT) 12.5 MG tablet TAKE 1 TABLET(12.5 MG) BY MOUTH THREE TIMES DAILY AS NEEDED FOR DIZZINESS 270 tablet 1  . metFORMIN (GLUCOPHAGE) 500 MG tablet TAKE 1 TABLET(500 MG) BY MOUTH TWICE DAILY AT 10 AM AND AT 5 PM 60 tablet 0  . metoprolol succinate (TOPROL-XL) 50 MG 24 hr tablet TAKE 1 TABLET BY MOUTH  DAILY 90 tablet 3  . ONE TOUCH ULTRA TEST test strip USE TO CHECK FASTING AND PRE-DINNER BLOOD GLUCOSE BID  12  . ONETOUCH DELICA LANCETS 03O MISC USE TO CHECK FASTING AND PRE-DINNER BLOOD GLUCOSE BID  12  . potassium citrate (UROCIT-K) 10 MEQ (1080 MG) SR tablet Take 10 mEq by mouth 3 (three) times daily with meals.    . ranitidine (ZANTAC) 75 MG tablet Take 75 mg by mouth at bedtime.    Marland Kitchen rOPINIRole (REQUIP) 1 MG tablet TAKE 1 TABLET BY  MOUTH 4  TIMES DAILY 360 tablet 3  . sertraline (ZOLOFT) 100 MG tablet TAKE 1 AND 1/2 TABLETS BY  MOUTH DAILY 135 tablet 3  . traMADol (ULTRAM) 50 MG tablet Take 1 tablet (50 mg total) by mouth 3 (three) times daily as needed. 90 tablet 3  . traZODone (DESYREL) 100 MG tablet TAKE 1/2 TO 1 TABLET BY  MOUTH NIGHTLY FOR SLEEP 90 tablet 1  . vitamin B-12 (CYANOCOBALAMIN) 100 MCG tablet Take 100 mcg by mouth daily.     No current facility-administered medications on file prior to visit.     PAST MEDICAL HISTORY: Past Medical History:  Diagnosis Date  . Allergy   . Anxiety   . Asthma   . Back pain   . Chronic kidney disease    KIDNEY STONES WITH STENTS  . Depression   . Diabetes ([place])   .  Diverticula of colon   . Dyspnea   . Eczema   . Food allergy   . Gallbladder problem   . GERD (gastroesophageal reflux disease)   . Hypercholesterolemia   . Hyperlipidemia   . Hypothyroidism   . Joint pain   . Kidney stones   . Leg edema   . Migraine headache   . Multiple sclerosis ([place])   . Obesity   . Palpitations   . PONV (postoperative nausea and vomiting)   . Restless leg syndrome   . Right sided sciatica 10/29/2015  . Snoring   . Thyroid disease    hyperthyroidism  . Unsteady gait   . Vertigo   . Vision abnormalities     PAST SURGICAL HISTORY: Past Surgical History:  Procedure Laterality Date  . BREAST SURGERY     AUGMENTATION  . carpel tunnel    . CHOLECYSTECTOMY    . DILATION AND CURETTAGE OF UTERUS  1983  . FINGER SURGERY  x8  . HERNIA REPAIR    . LITHOTRIPSY    . NISSEN FUNDOPLICATION    . VENTRAL HERNIA REPAIR  07/25/2012   Procedure: LAPAROSCOPIC VENTRAL HERNIA;  Surgeon: Adin Hector, MD;  Location: [facility];  Service: General;  Laterality: N/A;    SOCIAL HISTORY: Social History   Tobacco Use  . Smoking status: Former Research scientist (life sciences)  . Smokeless tobacco: Never Used  Substance Use Topics  . Alcohol use: No  . Drug use: No    FAMILY HISTORY: Family History  Problem Relation Age of Onset  . Hyperlipidemia Mother   . Glaucoma Mother   . Hypertension Mother   . Anxiety disorder Mother   . Depression Mother   . Heart disease Father   . Cancer Father        prostate  . Diabetes Father   . Dementia Father   . Hypertension Father   . Hyperlipidemia Father   . Sleep apnea Father   . Obesity Father   . Cancer Brother        esophageal  . Arthritis Sister        RA    ROS: Review of Systems  Constitutional: Positive for malaise/fatigue. Negative for weight loss.  Cardiovascular: Negative for chest pain.  Gastrointestinal: Negative for nausea and vomiting.  Musculoskeletal:       Negative muscle weakness  Endo/Heme/Allergies:       Negative  hypoglycemia    PHYSICAL EXAM: Blood pressure (!) 119/59, pulse 74, temperature 98.5 F (36.9 C), temperature source Oral, height _0  (1.448 m), weight 209 lb (94.8 kg), SpO2 97 %.  Body mass index is 45.23 kg/m. Physical Exam  Constitutional: She is oriented to person, place, and time. She appears well-developed and well-nourished.  Cardiovascular: Normal rate.  Pulmonary/Chest: Effort normal.  Musculoskeletal: Normal range of motion.  Neurological: She is oriented to person, place, and time.  Skin: Skin is warm and dry.  Psychiatric: She has a normal mood and affect. Her behavior is normal.  Vitals reviewed.   RECENT LABS AND TESTS: BMET    Component Value Date/Time   NA 142 09/14/2017 0902   K 4.6 09/14/2017 0902   CL 103 09/14/2017 0902   CO2 23 09/14/2017 0902   GLUCOSE 126 (H) 09/14/2017 0902   GLUCOSE 127 (H) 07/26/2012 0555   BUN 15 09/14/2017 0902   CREATININE 0.71 09/14/2017 0902   CALCIUM 9.1 09/14/2017 0902   GFRNONAA 89 09/14/2017 0902   GFRAA 103 09/14/2017 0902   Lab Results  Component Value Date   HGBA1C 6.5 (H) 09/14/2017   HGBA1C 7.5 (H) 05/10/2017   Lab Results  Component Value Date   INSULIN 26.4 (H) 09/14/2017   INSULIN 49.2 (H) 05/10/2017   CBC    Component Value Date/Time   WBC 6.2 05/10/2017 1138   WBC 6.6 07/26/2012 0555   RBC 4.58 05/10/2017 1138   RBC 3.77 (L) 07/26/2012 0555   HGB 12.8 05/10/2017 1138   HGB 10.9 (L) 06/11/2009 1303   HCT 40.9 05/10/2017 1138   HCT 33.6 (L) 06/11/2009 1303   PLT 232 05/10/2017 1138   MCV 89 05/10/2017 1138   MCV 78.3 (L) 06/11/2009 1303   MCH 27.9 05/10/2017 1138   MCH 29.2 07/26/2012 0555   MCHC 31.3 (L) 05/10/2017 1138   MCHC 31.3 07/26/2012 0555   RDW 14.6 05/10/2017 1138   RDW 16.3 (H) 06/11/2009 1303   LYMPHSABS 1.3 05/10/2017 1138   LYMPHSABS 1.3 06/11/2009 1303   MONOABS 0.6 07/20/2012 0848   MONOABS 0.7 06/11/2009 1303   EOSABS 0.2 05/10/2017 1138   BASOSABS 0.0 05/10/2017 1138     BASOSABS 0.0 06/11/2009 1303   Iron/TIBC/Ferritin/ %Sat    Component Value Date/Time   IRON 31 (L) 06/11/2009 1303   TIBC 420 06/11/2009 1303   FERRITIN 9 (L) 06/11/2009 1303   IRONPCTSAT 7 (L) 06/11/2009 1303   Lipid Panel     Component Value Date/Time   CHOL 162 09/14/2017 0902   TRIG 114 09/14/2017 0902   HDL 42 09/14/2017 0902   LDLCALC 97 09/14/2017 0902   Hepatic Function Panel     Component Value Date/Time   PROT 6.3 09/14/2017 0902   ALBUMIN 4.1 09/14/2017 0902   AST 16 09/14/2017 0902   ALT 17 09/14/2017 0902   ALKPHOS 80 09/14/2017 0902   BILITOT 0.3 09/14/2017 0902      Component Value Date/Time   TSH 3.430 09/14/2017 0902   TSH 2.670 05/10/2017 1138    ASSESSMENT AND PLAN: Type 2 diabetes mellitus without complication, without long-term current use of insulin (HCC)  Vitamin D deficiency - Plan: Vitamin D, Ergocalciferol, (DRISDOL) 50000 units CAPS capsule  At risk for heart disease  Class 1 obesity with serious comorbidity and body mass index (BMI) of 31.0 to 31.9 in adult, unspecified obesity type  PLAN:  Diabetes II Rue has been given extensive diabetes education by myself today including ideal fasting and post-prandial blood glucose readings, individual ideal Hgb A1c goals and hypoglycemia prevention. We discussed the importance of good blood sugar control to decrease the likelihood of diabetic complications such  as nephropathy, neuropathy, limb loss, blindness, coronary artery disease, and death. We discussed the importance of intensive lifestyle modification including diet, exercise and weight loss as the first line treatment for diabetes. Yachet agrees to continue taking metformin as prescribed and continue diet and exercise. Analuisa agrees to follow up with our clinic in 3 weeks.   Cardiovascular risk counselling Shellie was given extended (15 minutes) coronary artery disease prevention counseling today. She is 66 y.o. female and has risk  factors for heart disease including obesity and diabetes II. We discussed intensive lifestyle modifications today with an emphasis on specific weight loss instructions and strategies. Pt was also informed of the importance of increasing exercise and decreasing saturated fats to help prevent heart disease.  Vitamin D Deficiency Laporchia was informed that low vitamin D levels contributes to fatigue and are associated with obesity, breast, and colon cancer. Beyla agrees to continue taking prescription Vit D _0 ,000 IU every week #4 and we will refill for 1 month. She will follow up for routine testing of vitamin D, at least 2-3 times per year. She was informed of the risk of over-replacement of vitamin D and agrees to not increase her dose unless he discusses this with Jocelyn Wilson first. Naziyah agrees to follow up with our clinic in 3 weeks.  Obesity Artemisia is currently in the action stage of change. As such, her goal is to continue with weight loss efforts She has agreed to portion control better and make smarter food choices, such as increase vegetables and decrease simple carbohydrates  Aymara has been instructed to work up to a goal of 150 minutes of combined cardio and strengthening exercise per week for weight loss and overall health benefits. We discussed the following Behavioral Modification Strategies today: holiday eating strategies    Milissa has agreed to follow up with our clinic in 3 weeks. She was informed of the importance of frequent follow up visits to maximize her success with intensive lifestyle modifications for her multiple health conditions.  I, Trixie Dredge, am acting as transcriptionist for Dennard Nip, MD  I have reviewed the above documentation for accuracy and completeness, and I agree with the above. -Dennard Nip, MD     Today's visit was # 10 out of 22.  Starting weight: 232 lbs Starting date: 05/10/17 Today's weight : 209 lbs  Today's date: 09/28/2017 Total lbs lost  to date: 23 (Patients must lose 7 lbs in the first 6 months to continue with counseling)   ASK: We discussed the diagnosis of obesity with Rosamaria Lints today and Alveda agreed to give Jocelyn Wilson permission to discuss obesity behavioral modification therapy today.  ASSESS: Evie has the diagnosis of obesity and her BMI today is 45.21 Estie is in the action stage of change   ADVISE: Crystale was educated on the multiple health risks of obesity as well as the benefit of weight loss to improve her health. She was advised of the need for long term treatment and the importance of lifestyle modifications.  AGREE: Multiple dietary modification options and treatment options were discussed and  Adithi agreed to portion control better and make smarter food choices, such as increase vegetables and decrease simple carbohydrates  We discussed the following Behavioral Modification Strategies today: holiday eating strategies

## 2017-10-05 ENCOUNTER — Other Ambulatory Visit (INDEPENDENT_AMBULATORY_CARE_PROVIDER_SITE_OTHER): Payer: Self-pay | Admitting: Family Medicine

## 2017-10-05 DIAGNOSIS — E119 Type 2 diabetes mellitus without complications: Secondary | ICD-10-CM

## 2017-10-06 ENCOUNTER — Telehealth: Payer: Self-pay | Admitting: Neurology

## 2017-10-06 NOTE — Telephone Encounter (Signed)
Called patient to see how I could help address her concerns and patient said " #1) I am upset that it takes so long to get in with Dr Felecia Shelling. I have MS and I think its ridiculous that the earliest he can get me in is Jan 30."  #2  Pt is taking betaseron and the medication copay was [phone] and they are stating that its going to be even higher come the next time it is due. The patient is concerned and needs to talk with Dr Felecia Shelling to address this issue. I have offered the patient a new patient slot earlier in the month on the 17th of Jan at 1:30pm. Pt verbalized understanding and was appreciative.

## 2017-10-06 NOTE — Telephone Encounter (Signed)
Pt wants Dr Felecia Shelling or RN to call her to discuss an issues with insurance changing and that she wont have enough medicine to last her. Please contact  Interferon Beta-1b (BETASERON) 0.3 MG KIT injection

## 2017-10-12 ENCOUNTER — Telehealth: Payer: Self-pay | Admitting: *Deleted

## 2017-10-12 NOTE — Telephone Encounter (Signed)
Gorman.  Per Myriam Jacobson, while I was out of the office, pt. called with concerns over not being able to afford Betaseron copay./fim

## 2017-10-17 NOTE — Telephone Encounter (Signed)
Pt is returning call.  

## 2017-10-17 NOTE — Telephone Encounter (Signed)
Spoke with Jocelyn Wilson.  Her ins. has changed to Medicare, and has [employer] supplement.  Copay for Betaseron is high (has to meet $4400 deductible for prescription meds).  She has not qualified for pt. assistance so far due to income.  Doesn't think she's checked with [employer] though, so I gave [employer] phone# [phone]).  She will call them and apply for pt. assistance.  Another option would be to apply for grants thru foundations such as the [employer], or [employer]. This would offset the cost for her somewhat.  She is aware, however, that if income is the barrier to pt. assistance, then this is likely to be a problem with all MS meds, as most companies follow same guidelines in determining who is eligible for pt. assistance, esp. when dealing with Medicare pt's. I also moved her 1/17 with RAS to 10/19/17/fim

## 2017-10-19 ENCOUNTER — Ambulatory Visit: Payer: Medicare Other | Admitting: Neurology

## 2017-10-19 ENCOUNTER — Other Ambulatory Visit: Payer: Self-pay

## 2017-10-19 ENCOUNTER — Encounter: Payer: Self-pay | Admitting: Neurology

## 2017-10-19 VITALS — BP 145/76 | HR 82 | Resp 20 | Ht <= 58 in | Wt 214.0 lb

## 2017-10-19 DIAGNOSIS — F32A Depression, unspecified: Secondary | ICD-10-CM

## 2017-10-19 DIAGNOSIS — M4690 Unspecified inflammatory spondylopathy, site unspecified: Secondary | ICD-10-CM | POA: Diagnosis not present

## 2017-10-19 DIAGNOSIS — M5431 Sciatica, right side: Secondary | ICD-10-CM | POA: Diagnosis not present

## 2017-10-19 DIAGNOSIS — F329 Major depressive disorder, single episode, unspecified: Secondary | ICD-10-CM | POA: Diagnosis not present

## 2017-10-19 DIAGNOSIS — M47899 Other spondylosis, site unspecified: Secondary | ICD-10-CM

## 2017-10-19 DIAGNOSIS — M47819 Spondylosis without myelopathy or radiculopathy, site unspecified: Secondary | ICD-10-CM

## 2017-10-19 DIAGNOSIS — G35 Multiple sclerosis: Secondary | ICD-10-CM

## 2017-10-19 NOTE — Progress Notes (Signed)
[facility]  PATIENT: Jocelyn Wilson DOB: August 06, 1951  REFERRING DOCTOR OR PCP:  Adaku Nnodi SOURCE: Patient  _________________________________   HISTORICAL  CHIEF COMPLAINT:  Chief Complaint  Patient presents with  . Multiple Sclerosis    Sts. she continues to tolerate Betaseron well, but is having difficulty affording the copay.  She is not eliigible for pt. assistance/fim    HISTORY OF PRESENT ILLNESS:  Jocelyn Wilson is a 67 year old woman with multiple sclerosis, back pain (facet syndrome), and migraines.  Update 10/19/2017: She feels her MS remains stable on Betaseron. She tolerates it well and has not had any exacerbations in many years.    Her last MRI of the brain was 11/12/2015 and it shows foci in the brainstem and hemispheres consistent with MS. There was no change with compared to an MRI from 2016. Unfortunately she is having difficulty with her co-pay and she is not eligible for patient assistance.      We had a long discussion about her options (see plan) for a DMT.  She notes that her gait is stiff when she begins to walk after sitting for a while. She denies any change in strength and sensation. She notes no change in bladder function. Vision is fine.    She sleeps well with her current medications. She has restless leg syndrome that is helped by the medications. She still notes some depression.   Headaches are doing well  Back pain is doing about the same as last visit and better than 2 years ago.   She received a lot of benefit from radiofrequency ablation of the medial branch nerves   From 06/16/2016: Her LBP is severe at times.   Pain is mostly in the right > left buttock, and across the lower back.   Pain sometimes will radiate to the proximal leg but never down towards the foot    MRI of the lumbar spine shows severe degenerative disc disease and facet hypertrophy at L4-L5.   She has had radiofrequency ablations of the medial branch  nerves with benefit. She also has had ESI's, SI joint injections, piriformis muscles injections with less benefit.   She had RFA of the medial branch nerves 08/2014 with some benefit and got much more benefit after also doing PT 11/15 - 1/16.     She felt better for one month after a bilateral piriformis trigger point injection at the last visit.  Second shot did not help as much.    She will be having RFA by Dr. Jola Baptist soon.    MS:   Her MS has been stable. She denies any recent exacerbations. She continues on Betaseron and tolerates it well. MRI 2017 has shown periventricular white matter foci consistent with multiple sclerosis. When compared to prior MRI dated 08/25/2009, there were no new lesions. The older scan also showed a small left medullary focus that was not observed on the more recent study.      She is on Betaseron.   She denies any new changes with gait, strength or sensation. She denies any significant vision or bladder problems.    Dysesthesias:   She has painful dysesthesia in her shins and feet at times.     Sleep:   She has RLS day and night but worse at night.   Ropinirole greatly helps RLS and she now takes qid.    She gets slightly sleepy at times.     Mood:  She has a chronic mild depression  and often feels sad with occasional crying. She takes sertraline, BuSpar and alprazolam. She feels mood is better this visit than it was last visit.     Migraine headaches:  This is generally doing well.    MS History:   Her MS was diagnosed around 2004.    She was actually seeing me for migraine headache when she presented with optic neuritis and vertigo and had another MRI.   She had interval development of multiple new foci and underwent a lumbar puncture.  CSF was also consistent with MS.   She was placed on Betaseron and continues on that medication with good control of the MS. No definite exacerbations.    REVIEW OF SYSTEMS: Constitutional: No fevers, chills, sweats, or change in  appetite Eyes: No visual changes, double vision, eye pain Ear, nose and throat: No hearing loss, ear pain, nasal congestion, sore throat Cardiovascular: No chest pain, palpitations Respiratory: No shortness of breath at rest or with exertion.   No wheezes GastrointestinaI: No nausea, vomiting, diarrhea, abdominal pain, fecal incontinence Genitourinary: No dysuria, urinary retention or frequency.  No nocturia. Musculoskeletal: No neck pain, back pain Integumentary: No rash, pruritus, skin lesions Neurological: as above Psychiatric: No depression at this time.  No anxiety Endocrine: No palpitations, diaphoresis, change in appetite, change in weigh or increased thirst Hematologic/Lymphatic: No anemia, purpura, petechiae. Allergic/Immunologic: No itchy/runny eyes, nasal congestion, recent allergic reactions, rashes  ALLERGIES: Allergies  Allergen Reactions  . Iron Anaphylaxis    Iv iron  . Ciprofloxacin Nausea Only  . Contrast Media [Iodinated Diagnostic Agents] Itching and Rash  . Sulfa Antibiotics Itching and Rash  . Tape Rash    Adhesive tape    HOME MEDICATIONS:  Current Outpatient Medications:  .  atorvastatin (LIPITOR) 20 MG tablet, Take 20 mg by mouth daily., Disp: , Rfl:  .  busPIRone (BUSPAR) 30 MG tablet, TAKE 1 TABLET BY MOUTH 3  TIMES DAILY, Disp: 270 tablet, Rfl: 1 .  esomeprazole (NEXIUM) 40 MG capsule, TAKE 1 CAPSULE BY MOUTH TWO TIMES DAILY, Disp: 180 capsule, Rfl: 1 .  ferrous sulfate 325 (65 FE) MG tablet, Take 325 mg by mouth daily. , Disp: , Rfl:  .  FLOVENT HFA 220 MCG/ACT inhaler, Inhale 1 puff into the lungs daily. , Disp: , Rfl: 1 .  Ibuprofen (ADVIL) 200 MG CAPS, Take 2 capsules by mouth 3 (three) times daily as needed. For migraines, Disp: , Rfl:  .  Interferon Beta-1b (BETASERON) 0.3 MG KIT injection, INJECT 0.25 MG UNDER THE SKIN EVERY OTHER DAY AS DIRECTED, Disp: 42 each, Rfl: 2 .  levothyroxine (SYNTHROID, LEVOTHROID) 100 MCG tablet, TK 1 T PO QD,  Disp: , Rfl: 0 .  meclizine (ANTIVERT) 12.5 MG tablet, TAKE 1 TABLET(12.5 MG) BY MOUTH THREE TIMES DAILY AS NEEDED FOR DIZZINESS, Disp: 270 tablet, Rfl: 1 .  metFORMIN (GLUCOPHAGE) 500 MG tablet, TAKE 1 TABLET(500 MG) BY MOUTH TWICE DAILY AT 10 AM AND AT 5 PM, Disp: 60 tablet, Rfl: 0 .  metoprolol succinate (TOPROL-XL) 50 MG 24 hr tablet, TAKE 1 TABLET BY MOUTH  DAILY, Disp: 90 tablet, Rfl: 3 .  ONE TOUCH ULTRA TEST test strip, USE TO CHECK FASTING AND PRE-DINNER BLOOD GLUCOSE BID, Disp: , Rfl: 12 .  ONETOUCH DELICA LANCETS 96E MISC, USE TO CHECK FASTING AND PRE-DINNER BLOOD GLUCOSE BID, Disp: , Rfl: 12 .  potassium citrate (UROCIT-K) 10 MEQ (1080 MG) SR tablet, Take 10 mEq by mouth 3 (three) times daily with meals.,  Disp: , Rfl:  .  ranitidine (ZANTAC) 75 MG tablet, Take 75 mg by mouth at bedtime., Disp: , Rfl:  .  rOPINIRole (REQUIP) 1 MG tablet, TAKE 1 TABLET BY MOUTH 4  TIMES DAILY, Disp: 360 tablet, Rfl: 3 .  sertraline (ZOLOFT) 100 MG tablet, TAKE 1 AND 1/2 TABLETS BY  MOUTH DAILY, Disp: 135 tablet, Rfl: 3 .  traMADol (ULTRAM) 50 MG tablet, Take 1 tablet (50 mg total) by mouth 3 (three) times daily as needed., Disp: 90 tablet, Rfl: 3 .  traZODone (DESYREL) 100 MG tablet, TAKE 1/2 TO 1 TABLET BY  MOUTH NIGHTLY FOR SLEEP, Disp: 90 tablet, Rfl: 1 .  vitamin B-12 (CYANOCOBALAMIN) 100 MCG tablet, Take 100 mcg by mouth daily., Disp: , Rfl:  .  Vitamin D, Ergocalciferol, (DRISDOL) 50000 units CAPS capsule, Take 1 capsule (50,000 Units total) by mouth every 7 (seven) days., Disp: 4 capsule, Rfl: 0  PAST MEDICAL HISTORY: Past Medical History:  Diagnosis Date  . Allergy   . Anxiety   . Asthma   . Back pain   . Chronic kidney disease    KIDNEY STONES WITH STENTS  . Depression   . Diabetes ([place])   . Diverticula of colon   . Dyspnea   . Eczema   . Food allergy   . Gallbladder problem   . GERD (gastroesophageal reflux disease)   . Hypercholesterolemia   . Hyperlipidemia   . Hypothyroidism     . Joint pain   . Kidney stones   . Leg edema   . Migraine headache   . Multiple sclerosis ([place])   . Obesity   . Palpitations   . PONV (postoperative nausea and vomiting)   . Restless leg syndrome   . Right sided sciatica 10/29/2015  . Snoring   . Thyroid disease    hyperthyroidism  . Unsteady gait   . Vertigo   . Vision abnormalities     PAST SURGICAL HISTORY: Past Surgical History:  Procedure Laterality Date  . BREAST SURGERY     AUGMENTATION  . carpel tunnel    . CHOLECYSTECTOMY    . DILATION AND CURETTAGE OF UTERUS  1983  . FINGER SURGERY  x8  . HERNIA REPAIR    . LITHOTRIPSY    . NISSEN FUNDOPLICATION    . VENTRAL HERNIA REPAIR  07/25/2012   Procedure: LAPAROSCOPIC VENTRAL HERNIA;  Surgeon: Adin Hector, MD;  Location: [facility];  Service: General;  Laterality: N/A;    FAMILY HISTORY: Family History  Problem Relation Age of Onset  . Hyperlipidemia Mother   . Glaucoma Mother   . Hypertension Mother   . Anxiety disorder Mother   . Depression Mother   . Heart disease Father   . Cancer Father        prostate  . Diabetes Father   . Dementia Father   . Hypertension Father   . Hyperlipidemia Father   . Sleep apnea Father   . Obesity Father   . Cancer Brother        esophageal  . Arthritis Sister        RA    SOCIAL HISTORY:  Social History   Socioeconomic History  . Marital status: Married    Spouse name: Lizette Pazos  . Number of children: 1  . Years of education: Not on file  . Highest education level: Not on file  Social Needs  . Financial resource strain: Not on file  . Food insecurity - worry:  Not on file  . Food insecurity - inability: Not on file  . Transportation needs - medical: Not on file  . Transportation needs - non-medical: Not on file  Occupational History  . Occupation: retired  Tobacco Use  . Smoking status: Former Research scientist (life sciences)  . Smokeless tobacco: Never Used  Substance and Sexual Activity  . Alcohol use: No  . Drug use: No   . Sexual activity: Not on file  Other Topics Concern  . Not on file  Social History Narrative  . Not on file     PHYSICAL EXAM  Vitals:   10/19/17 1516  BP: (!) 145/76  Pulse: 82  Resp: 20  Weight: 214 lb (97.1 kg)  Height: 4' 9" (1.448 m)    Body mass index is 46.31 kg/m.   General: The patient is well-developed and well-nourished and in no acute distress  Musculoskeletal:  Lower lumbar paraspinals are nontender.   She has reduced range of motion in the back.  Neurologic Exam  Mental status: The patient is alert and oriented x 3 at the time of the examination. The patient has apparent normal recent and remote memory, with an apparently normal attention span and concentration ability.   Speech is normal.  Cranial nerves: Extraocular movements are full.  Facial strength and sensation is normal. Trapezius strength is strong.  The tongue is midline, and the patient has symmetric elevation of the soft palate. No obvious hearing deficits are noted.  Motor:  Muscle bulk is normal.   Tone is normal. Strength is  5 / 5 in all 4 extremities.   Sensory: Sensory testing is intact to soft touch.     Gait and station: Station is normal.   Gait is wide and arthritic and tandem is wide.   Romberg negative  Reflexes: Deep tendon reflexes are symmetric and normal bilaterally.       DIAGNOSTIC DATA (LABS, IMAGING, TESTING) - I reviewed patient records, labs, notes, testing and imaging myself where available.    ASSESSMENT AND PLAN  Multiple sclerosis ([place]) - Plan: MR BRAIN W WO CONTRAST  Right sided sciatica  Depression, unspecified depression type  Facet syndrome ([place])  1.    We had a long discussion about options for her multiple sclerosis. She has been on Betaseron since about 2004 and has tolerated it well and has had no exacerbations. We discussed that she is fortunate to have a milder multiple sclerosis. Currently she is unable to get patient assistance for Betaseron  and her copay would be about $5000 per year. We went over several options. We could consider a different disease modifying therapy as she may qualify for patient assistance with one of the other interferons such as Avonex or Rebif.    A second option would be to stop the Betaseron and follow MRIs on an annual basis to determine if she has subclinical breakthrough. If she does have changes on the MRI or experiences a relapse, we could get her back on a therapy period. A third option would be to try leflunomide. This is a medication approved for rheumatoid arthritis but not for multiple sclerosis. However, it is related to her leflunomide which is an FDA approved medication for MS but is much less expensive.   I have used leflunomide and several other MS patients and did not qualify for coping assistance.      She will consider these options. We will be checking an MRI of the brain. If she does show subclinical progression  since her previous MRI 2 years ago, then I would recommend either a different interferon or leflunomide. If she does decide to stay on Betaseron. A higher co-pay, I believe her MS could be well controlled on less frequent dosing, twice a week, which would allow her to save some money. 2.   Continue tramadol, ropinirole and other med's 3.   She will return to see me in 12 months or sooner if she has new or worsening neurologic symptoms.   45 minute face-to-face evaluation with greater than one half the time counseling and coordinating care about her MS and disease modifying therapy options     A. Felecia Shelling, MD, PhD 05/11/8562, 1:49 PM Certified in Neurology, Clinical Neurophysiology, Sleep Medicine, Pain Medicine and Neuroimaging  [facility] [address] [place], [place] [address] [phone]  ;,

## 2017-10-20 ENCOUNTER — Ambulatory Visit (INDEPENDENT_AMBULATORY_CARE_PROVIDER_SITE_OTHER): Payer: Medicare Other | Admitting: Family Medicine

## 2017-10-20 VITALS — BP 132/82 | HR 72 | Temp 97.9°F | Ht <= 58 in | Wt 210.0 lb

## 2017-10-20 DIAGNOSIS — Z9189 Other specified personal risk factors, not elsewhere classified: Secondary | ICD-10-CM | POA: Diagnosis not present

## 2017-10-20 DIAGNOSIS — E119 Type 2 diabetes mellitus without complications: Secondary | ICD-10-CM | POA: Diagnosis not present

## 2017-10-20 DIAGNOSIS — Z6841 Body Mass Index (BMI) 40.0 and over, adult: Secondary | ICD-10-CM | POA: Diagnosis not present

## 2017-10-20 MED ORDER — INSULIN PEN NEEDLE 32G X 4 MM MISC: 1.0000 | Freq: Every day | 0 refills | Status: DC

## 2017-10-20 MED ORDER — LIRAGLUTIDE 18 MG/3ML ~~LOC~~ SOPN
0.6000 mg | PEN_INJECTOR | SUBCUTANEOUS | 0 refills | Status: DC
Start: 2017-10-20 — End: 2017-11-02

## 2017-10-24 NOTE — Progress Notes (Signed)
Office: [phone]  /  Fax: [phone]   HPI:   Chief Complaint: OBESITY Jocelyn Wilson is here to discuss her progress with her obesity treatment plan. She is on the portion control better and make smarter food choices, such as increase vegetables and decrease simple carbohydrates  and is following her eating plan approximately 50 % of the time. She states she is exercising 0 minutes 0 times per week. Jocelyn Wilson did well minimizing weight gain over the holidays and is ready to get back on a structured plan. Her weight is 210 lb (95.3 kg) today and has had a weight gain of 1 pound over a period of 3 weeks since her last visit. She has lost 22 lbs since starting treatment with [place].  Diabetes II Jocelyn Wilson has a diagnosis of diabetes type II. Her last A1c was at 6.5 and she states fasting BGs range between 140 and 160. She is on metformin but notes increased polyphagia and denies hypoglycemia. She has been working on intensive lifestyle modifications including diet, exercise, and weight loss to help control her blood glucose levels.  At risk for cardiovascular disease Jocelyn Wilson is at a higher than average risk for cardiovascular disease due to DM and obesity. She currently denies any chest pain.  ALLERGIES: Allergies  Allergen Reactions  . Iron Anaphylaxis    Iv iron  . Ciprofloxacin Nausea Only  . Contrast Media [Iodinated Diagnostic Agents] Itching and Rash  . Sulfa Antibiotics Itching and Rash  . Tape Rash    Adhesive tape    MEDICATIONS: Current Outpatient Medications on File Prior to Visit  Medication Sig Dispense Refill  . atorvastatin (LIPITOR) 20 MG tablet Take 20 mg by mouth daily.    . busPIRone (BUSPAR) 30 MG tablet TAKE 1 TABLET BY MOUTH 3  TIMES DAILY 270 tablet 1  . esomeprazole (NEXIUM) 40 MG capsule TAKE 1 CAPSULE BY MOUTH TWO TIMES DAILY 180 capsule 1  . ferrous sulfate 325 (65 FE) MG tablet Take 325 mg by mouth daily.     Marland Kitchen FLOVENT HFA 220 MCG/ACT inhaler Inhale 1 puff into the  lungs daily.   1  . Ibuprofen (ADVIL) 200 MG CAPS Take 2 capsules by mouth 3 (three) times daily as needed. For migraines    . Interferon Beta-1b (BETASERON) 0.3 MG KIT injection INJECT 0.25 MG UNDER THE SKIN EVERY OTHER DAY AS DIRECTED 42 each 2  . levothyroxine (SYNTHROID, LEVOTHROID) 100 MCG tablet TK 1 T PO QD  0  . meclizine (ANTIVERT) 12.5 MG tablet TAKE 1 TABLET(12.5 MG) BY MOUTH THREE TIMES DAILY AS NEEDED FOR DIZZINESS 270 tablet 1  . metFORMIN (GLUCOPHAGE) 500 MG tablet TAKE 1 TABLET(500 MG) BY MOUTH TWICE DAILY AT 10 AM AND AT 5 PM 60 tablet 0  . metoprolol succinate (TOPROL-XL) 50 MG 24 hr tablet TAKE 1 TABLET BY MOUTH  DAILY 90 tablet 3  . ONE TOUCH ULTRA TEST test strip USE TO CHECK FASTING AND PRE-DINNER BLOOD GLUCOSE BID  12  . ONETOUCH DELICA LANCETS 24P MISC USE TO CHECK FASTING AND PRE-DINNER BLOOD GLUCOSE BID  12  . potassium citrate (UROCIT-K) 10 MEQ (1080 MG) SR tablet Take 10 mEq by mouth 3 (three) times daily with meals.    . ranitidine (ZANTAC) 75 MG tablet Take 75 mg by mouth at bedtime.    Marland Kitchen rOPINIRole (REQUIP) 1 MG tablet TAKE 1 TABLET BY MOUTH 4  TIMES DAILY 360 tablet 3  . sertraline (ZOLOFT) 100 MG tablet TAKE 1 AND  1/2 TABLETS BY  MOUTH DAILY 135 tablet 3  . traMADol (ULTRAM) 50 MG tablet Take 1 tablet (50 mg total) by mouth 3 (three) times daily as needed. 90 tablet 3  . traZODone (DESYREL) 100 MG tablet TAKE 1/2 TO 1 TABLET BY  MOUTH NIGHTLY FOR SLEEP 90 tablet 1  . vitamin B-12 (CYANOCOBALAMIN) 100 MCG tablet Take 100 mcg by mouth daily.    . Vitamin D, Ergocalciferol, (DRISDOL) 50000 units CAPS capsule Take 1 capsule (50,000 Units total) by mouth every 7 (seven) days. 4 capsule 0   No current facility-administered medications on file prior to visit.     PAST MEDICAL HISTORY: Past Medical History:  Diagnosis Date  . Allergy   . Anxiety   . Asthma   . Back pain   . Chronic kidney disease    KIDNEY STONES WITH STENTS  . Depression   . Diabetes ([place])     . Diverticula of colon   . Dyspnea   . Eczema   . Food allergy   . Gallbladder problem   . GERD (gastroesophageal reflux disease)   . Hypercholesterolemia   . Hyperlipidemia   . Hypothyroidism   . Joint pain   . Kidney stones   . Leg edema   . Migraine headache   . Multiple sclerosis ([place])   . Obesity   . Palpitations   . PONV (postoperative nausea and vomiting)   . Restless leg syndrome   . Right sided sciatica 10/29/2015  . Snoring   . Thyroid disease    hyperthyroidism  . Unsteady gait   . Vertigo   . Vision abnormalities     PAST SURGICAL HISTORY: Past Surgical History:  Procedure Laterality Date  . BREAST SURGERY     AUGMENTATION  . carpel tunnel    . CHOLECYSTECTOMY    . DILATION AND CURETTAGE OF UTERUS  1983  . FINGER SURGERY  x8  . HERNIA REPAIR    . LITHOTRIPSY    . NISSEN FUNDOPLICATION    . VENTRAL HERNIA REPAIR  07/25/2012   Procedure: LAPAROSCOPIC VENTRAL HERNIA;  Surgeon: Adin Hector, MD;  Location: [facility];  Service: General;  Laterality: N/A;    SOCIAL HISTORY: Social History   Tobacco Use  . Smoking status: Former Research scientist (life sciences)  . Smokeless tobacco: Never Used  Substance Use Topics  . Alcohol use: No  . Drug use: No    FAMILY HISTORY: Family History  Problem Relation Age of Onset  . Hyperlipidemia Mother   . Glaucoma Mother   . Hypertension Mother   . Anxiety disorder Mother   . Depression Mother   . Heart disease Father   . Cancer Father        prostate  . Diabetes Father   . Dementia Father   . Hypertension Father   . Hyperlipidemia Father   . Sleep apnea Father   . Obesity Father   . Cancer Brother        esophageal  . Arthritis Sister        RA    ROS: Review of Systems  Constitutional: Negative for weight loss.  Endo/Heme/Allergies:       Positive polyphagia Negative hypoglycemia    PHYSICAL EXAM: Blood pressure 132/82, pulse 72, temperature 97.9 F (36.6 C), temperature source Oral, height '4\' 9"'  (1.448 m),  weight 210 lb (95.3 kg), SpO2 98 %. Body mass index is 45.44 kg/m. Physical Exam  Constitutional: She is oriented to person, place, and time.  She appears well-developed and well-nourished.  Cardiovascular: Normal rate.  Pulmonary/Chest: Effort normal.  Musculoskeletal: Normal range of motion.  Neurological: She is oriented to person, place, and time.  Skin: Skin is warm and dry.  Psychiatric: She has a normal mood and affect. Her behavior is normal.  Vitals reviewed.   RECENT LABS AND TESTS: BMET    Component Value Date/Time   NA 142 09/14/2017 0902   K 4.6 09/14/2017 0902   CL 103 09/14/2017 0902   CO2 23 09/14/2017 0902   GLUCOSE 126 (H) 09/14/2017 0902   GLUCOSE 127 (H) 07/26/2012 0555   BUN 15 09/14/2017 0902   CREATININE 0.71 09/14/2017 0902   CALCIUM 9.1 09/14/2017 0902   GFRNONAA 89 09/14/2017 0902   GFRAA 103 09/14/2017 0902   Lab Results  Component Value Date   HGBA1C 6.5 (H) 09/14/2017   HGBA1C 7.5 (H) 05/10/2017   Lab Results  Component Value Date   INSULIN 26.4 (H) 09/14/2017   INSULIN 49.2 (H) 05/10/2017   CBC    Component Value Date/Time   WBC 6.2 05/10/2017 1138   WBC 6.6 07/26/2012 0555   RBC 4.58 05/10/2017 1138   RBC 3.77 (L) 07/26/2012 0555   HGB 12.8 05/10/2017 1138   HGB 10.9 (L) 06/11/2009 1303   HCT 40.9 05/10/2017 1138   HCT 33.6 (L) 06/11/2009 1303   PLT 232 05/10/2017 1138   MCV 89 05/10/2017 1138   MCV 78.3 (L) 06/11/2009 1303   MCH 27.9 05/10/2017 1138   MCH 29.2 07/26/2012 0555   MCHC 31.3 (L) 05/10/2017 1138   MCHC 31.3 07/26/2012 0555   RDW 14.6 05/10/2017 1138   RDW 16.3 (H) 06/11/2009 1303   LYMPHSABS 1.3 05/10/2017 1138   LYMPHSABS 1.3 06/11/2009 1303   MONOABS 0.6 07/20/2012 0848   MONOABS 0.7 06/11/2009 1303   EOSABS 0.2 05/10/2017 1138   BASOSABS 0.0 05/10/2017 1138   BASOSABS 0.0 06/11/2009 1303   Iron/TIBC/Ferritin/ %Sat    Component Value Date/Time   IRON 31 (L) 06/11/2009 1303   TIBC 420 06/11/2009 1303     FERRITIN 9 (L) 06/11/2009 1303   IRONPCTSAT 7 (L) 06/11/2009 1303   Lipid Panel     Component Value Date/Time   CHOL 162 09/14/2017 0902   TRIG 114 09/14/2017 0902   HDL 42 09/14/2017 0902   LDLCALC 97 09/14/2017 0902   Hepatic Function Panel     Component Value Date/Time   PROT 6.3 09/14/2017 0902   ALBUMIN 4.1 09/14/2017 0902   AST 16 09/14/2017 0902   ALT 17 09/14/2017 0902   ALKPHOS 80 09/14/2017 0902   BILITOT 0.3 09/14/2017 0902      Component Value Date/Time   TSH 3.430 09/14/2017 0902   TSH 2.670 05/10/2017 1138    ASSESSMENT AND PLAN: Type 2 diabetes mellitus without complication, without long-term current use of insulin ([place]) - Plan: liraglutide (VICTOZA) 18 MG/3ML SOPN, Insulin Pen Needle 32G X 4 MM MISC  Class 3 severe obesity with serious comorbidity and body mass index (BMI) of 45.0 to 49.9 in adult, unspecified obesity type ([place])  PLAN:  Diabetes II Cassie has been given extensive diabetes education by myself today including ideal fasting and post-prandial blood glucose readings, individual ideal Hgb A1c goals and hypoglycemia prevention. We discussed the importance of good blood sugar control to decrease the likelihood of diabetic complications such as nephropathy, neuropathy, limb loss, blindness, coronary artery disease, and death. We discussed the importance of intensive lifestyle modification including diet,  exercise and weight loss as the first line treatment for diabetes. Maimuna agrees to continue metformin and start Victoza 0.6 mg qam #1 pen with no refills and pen needles. Chanin agrees to follow up at the agreed upon time.  Cardiovascular risk counselling Daniqua was given extended (15 minutes) coronary artery disease prevention counseling today. She is 67 y.o. female and has risk factors for heart disease including obesity. We discussed intensive lifestyle modifications today with an emphasis on specific weight loss instructions and strategies. Pt was  also informed of the importance of increasing exercise and decreasing saturated fats to help prevent heart disease. Obesity Glendine is currently in the action stage of change. As such, her goal is to continue with weight loss efforts She has agreed to follow a lower carbohydrate, vegetable and lean protein rich diet plan Hadleigh has been instructed to work up to a goal of 150 minutes of combined cardio and strengthening exercise per week for weight loss and overall health benefits. We discussed the following Behavioral Modification Strategies today: increase H2O intake, increasing lean protein intake and work on meal planning and easy cooking plans   Dona has agreed to follow up with our clinic in 2 weeks. She was informed of the importance of frequent follow up visits to maximize her success with intensive lifestyle modifications for her multiple health conditions.   OBESITY BEHAVIORAL INTERVENTION VISIT  Today's visit was # 11 out of 22.  Starting weight: 232 lbs Starting date: 05/10/17 Today's weight : 210 lbs Today's date: 10/20/2017 Total lbs lost to date: 22 (Patients must lose 7 lbs in the first 6 months to continue with counseling)   ASK: We discussed the diagnosis of obesity with Rosamaria Lints today and Marzetta agreed to give [place] permission to discuss obesity behavioral modification therapy today.  ASSESS: Lasaundra has the diagnosis of obesity and her BMI today is 45.43 Macy is in the action stage of change   ADVISE: Jessee was educated on the multiple health risks of obesity as well as the benefit of weight loss to improve her health. She was advised of the need for long term treatment and the importance of lifestyle modifications.  AGREE: Multiple dietary modification options and treatment options were discussed and  Araya agreed to the above obesity treatment plan.  Corey Skains, am acting as transcriptionist for Dennard Nip, MD

## 2017-10-27 ENCOUNTER — Ambulatory Visit: Payer: Self-pay | Admitting: Neurology

## 2017-10-27 ENCOUNTER — Other Ambulatory Visit (INDEPENDENT_AMBULATORY_CARE_PROVIDER_SITE_OTHER): Payer: Self-pay | Admitting: Family Medicine

## 2017-10-27 DIAGNOSIS — E559 Vitamin D deficiency, unspecified: Secondary | ICD-10-CM

## 2017-10-31 ENCOUNTER — Telehealth: Payer: Self-pay | Admitting: Neurology

## 2017-10-31 DIAGNOSIS — G35 Multiple sclerosis: Secondary | ICD-10-CM

## 2017-10-31 NOTE — Telephone Encounter (Signed)
Patient states she is allergic to contrast and would like the order to be a MR Brain wo contrast. When you get a chance can you please switch the order. Thank you!

## 2017-11-02 ENCOUNTER — Ambulatory Visit (INDEPENDENT_AMBULATORY_CARE_PROVIDER_SITE_OTHER): Payer: Medicare Other | Admitting: Family Medicine

## 2017-11-02 VITALS — BP 115/74 | HR 75 | Temp 97.7°F | Ht <= 58 in | Wt 206.0 lb

## 2017-11-02 DIAGNOSIS — E119 Type 2 diabetes mellitus without complications: Secondary | ICD-10-CM | POA: Diagnosis not present

## 2017-11-02 DIAGNOSIS — E559 Vitamin D deficiency, unspecified: Secondary | ICD-10-CM | POA: Diagnosis not present

## 2017-11-02 DIAGNOSIS — Z6841 Body Mass Index (BMI) 40.0 and over, adult: Secondary | ICD-10-CM | POA: Diagnosis not present

## 2017-11-02 MED ORDER — LIRAGLUTIDE 18 MG/3ML ~~LOC~~ SOPN: 0.6000 mg | PEN_INJECTOR | SUBCUTANEOUS | 0 refills | Status: DC

## 2017-11-02 NOTE — Progress Notes (Signed)
Office: [phone]  /  Fax: [phone]   HPI:   Chief Complaint: OBESITY Jocelyn Wilson is here to discuss her progress with her obesity treatment plan. She is on the lower carbohydrate, vegetable and lean protein rich diet plan and is following her eating plan approximately 80 % of the time. She states she is exercising 0 minutes 0 times per week. Jocelyn Wilson started Victoza at last visit and has been relatively compliant with the low carbohydrate plan. She is missing the meals of category plans and is not feeling satisfied with low carbohydrates secondary to missing dairy. Her weight is 206 lb (93.4 kg) today and has had a weight loss of 4 pounds over a period of 2 weeks since her last visit. She has lost 26 lbs since starting treatment with [place].  Diabetes II Jocelyn Wilson has a diagnosis of diabetes type II. Jocelyn Wilson states fasting BGs improved from  range of between 150's and 160's to now ranging between 110's and 120's and she denies any hypoglycemic episodes. She stopped metformin when she started Victoza. She has been working on intensive lifestyle modifications including diet, exercise, and weight loss to help control her blood glucose levels.  Vitamin D deficiency Jocelyn Wilson has a diagnosis of vitamin D deficiency. She is currently taking vit D and denies nausea, vomiting or muscle weakness.   Ref. Range 09/14/2017 09:02  Vitamin D, 25-Hydroxy Latest Ref Range: 30.0 - 100.0 ng/mL 54.3    ALLERGIES: Allergies  Allergen Reactions  . Iron Anaphylaxis    Iv iron  . Ciprofloxacin Nausea Only  . Contrast Media [Iodinated Diagnostic Agents] Itching and Rash  . Sulfa Antibiotics Itching and Rash  . Tape Rash    Adhesive tape    MEDICATIONS: Current Outpatient Medications on File Prior to Visit  Medication Sig Dispense Refill  . atorvastatin (LIPITOR) 20 MG tablet Take 20 mg by mouth daily.    . busPIRone (BUSPAR) 30 MG tablet TAKE 1 TABLET BY MOUTH 3  TIMES DAILY 270 tablet 1  . esomeprazole  (NEXIUM) 40 MG capsule TAKE 1 CAPSULE BY MOUTH TWO TIMES DAILY 180 capsule 1  . ferrous sulfate 325 (65 FE) MG tablet Take 325 mg by mouth daily.     Jocelyn Wilson FLOVENT HFA 220 MCG/ACT inhaler Inhale 1 puff into the lungs daily.   1  . Ibuprofen (ADVIL) 200 MG CAPS Take 2 capsules by mouth 3 (three) times daily as needed. For migraines    . Insulin Pen Needle 32G X 4 MM MISC 1 Package by Does not apply route daily at 12 noon. 100 each 0  . Interferon Beta-1b (BETASERON) 0.3 MG KIT injection INJECT 0.25 MG UNDER THE SKIN EVERY OTHER DAY AS DIRECTED 42 each 2  . levothyroxine (SYNTHROID, LEVOTHROID) 100 MCG tablet TK 1 T PO QD  0  . meclizine (ANTIVERT) 12.5 MG tablet TAKE 1 TABLET(12.5 MG) BY MOUTH THREE TIMES DAILY AS NEEDED FOR DIZZINESS 270 tablet 1  . metoprolol succinate (TOPROL-XL) 50 MG 24 hr tablet TAKE 1 TABLET BY MOUTH  DAILY 90 tablet 3  . ONE TOUCH ULTRA TEST test strip USE TO CHECK FASTING AND PRE-DINNER BLOOD GLUCOSE BID  12  . ONETOUCH DELICA LANCETS 27O MISC USE TO CHECK FASTING AND PRE-DINNER BLOOD GLUCOSE BID  12  . potassium citrate (UROCIT-K) 10 MEQ (1080 MG) SR tablet Take 10 mEq by mouth 3 (three) times daily with meals.    . ranitidine (ZANTAC) 75 MG tablet Take 75 mg by mouth at bedtime.    Jocelyn Wilson  rOPINIRole (REQUIP) 1 MG tablet TAKE 1 TABLET BY MOUTH 4  TIMES DAILY 360 tablet 3  . sertraline (ZOLOFT) 100 MG tablet TAKE 1 AND 1/2 TABLETS BY  MOUTH DAILY 135 tablet 3  . traMADol (ULTRAM) 50 MG tablet Take 1 tablet (50 mg total) by mouth 3 (three) times daily as needed. 90 tablet 3  . traZODone (DESYREL) 100 MG tablet TAKE 1/2 TO 1 TABLET BY  MOUTH NIGHTLY FOR SLEEP 90 tablet 1  . vitamin B-12 (CYANOCOBALAMIN) 100 MCG tablet Take 100 mcg by mouth daily.    . Vitamin D, Ergocalciferol, (DRISDOL) 50000 units CAPS capsule Take 1 capsule (50,000 Units total) by mouth every 7 (seven) days. 4 capsule 0   No current facility-administered medications on file prior to visit.     PAST MEDICAL  HISTORY: Past Medical History:  Diagnosis Date  . Allergy   . Anxiety   . Asthma   . Back pain   . Chronic kidney disease    KIDNEY STONES WITH STENTS  . Depression   . Diabetes ([place])   . Diverticula of colon   . Dyspnea   . Eczema   . Food allergy   . Gallbladder problem   . GERD (gastroesophageal reflux disease)   . Hypercholesterolemia   . Hyperlipidemia   . Hypothyroidism   . Joint pain   . Kidney stones   . Leg edema   . Migraine headache   . Multiple sclerosis ([place])   . Obesity   . Palpitations   . PONV (postoperative nausea and vomiting)   . Restless leg syndrome   . Right sided sciatica 10/29/2015  . Snoring   . Thyroid disease    hyperthyroidism  . Unsteady gait   . Vertigo   . Vision abnormalities     PAST SURGICAL HISTORY: Past Surgical History:  Procedure Laterality Date  . BREAST SURGERY     AUGMENTATION  . carpel tunnel    . CHOLECYSTECTOMY    . DILATION AND CURETTAGE OF UTERUS  1983  . FINGER SURGERY  x8  . HERNIA REPAIR    . LITHOTRIPSY    . NISSEN FUNDOPLICATION    . VENTRAL HERNIA REPAIR  07/25/2012   Procedure: LAPAROSCOPIC VENTRAL HERNIA;  Surgeon: Adin Hector, MD;  Location: [facility];  Service: General;  Laterality: N/A;    SOCIAL HISTORY: Social History   Tobacco Use  . Smoking status: Former Research scientist (life sciences)  . Smokeless tobacco: Never Used  Substance Use Topics  . Alcohol use: No  . Drug use: No    FAMILY HISTORY: Family History  Problem Relation Age of Onset  . Hyperlipidemia Mother   . Glaucoma Mother   . Hypertension Mother   . Anxiety disorder Mother   . Depression Mother   . Heart disease Father   . Cancer Father        prostate  . Diabetes Father   . Dementia Father   . Hypertension Father   . Hyperlipidemia Father   . Sleep apnea Father   . Obesity Father   . Cancer Brother        esophageal  . Arthritis Sister        RA    ROS: Review of Systems  Constitutional: Positive for weight loss.    Gastrointestinal: Negative for nausea and vomiting.  Musculoskeletal:       Negative muscle weakness  Endo/Heme/Allergies:       Negative hypoglycemia    PHYSICAL EXAM: Blood  pressure 115/74, pulse 75, temperature 97.7 F (36.5 C), temperature source Oral, height '4\' 9"'  (1.448 m), weight 206 lb (93.4 kg), SpO2 98 %. Body mass index is 44.58 kg/m. Physical Exam  Constitutional: She is oriented to person, place, and time. She appears well-developed and well-nourished.  Cardiovascular: Normal rate.  Pulmonary/Chest: Effort normal.  Musculoskeletal: Normal range of motion.  Neurological: She is oriented to person, place, and time.  Skin: Skin is warm and dry.  Psychiatric: She has a normal mood and affect. Her behavior is normal.  Vitals reviewed.   RECENT LABS AND TESTS: BMET    Component Value Date/Time   NA 142 09/14/2017 0902   K 4.6 09/14/2017 0902   CL 103 09/14/2017 0902   CO2 23 09/14/2017 0902   GLUCOSE 126 (H) 09/14/2017 0902   GLUCOSE 127 (H) 07/26/2012 0555   BUN 15 09/14/2017 0902   CREATININE 0.71 09/14/2017 0902   CALCIUM 9.1 09/14/2017 0902   GFRNONAA 89 09/14/2017 0902   GFRAA 103 09/14/2017 0902   Lab Results  Component Value Date   HGBA1C 6.5 (H) 09/14/2017   HGBA1C 7.5 (H) 05/10/2017   Lab Results  Component Value Date   INSULIN 26.4 (H) 09/14/2017   INSULIN 49.2 (H) 05/10/2017   CBC    Component Value Date/Time   WBC 6.2 05/10/2017 1138   WBC 6.6 07/26/2012 0555   RBC 4.58 05/10/2017 1138   RBC 3.77 (L) 07/26/2012 0555   HGB 12.8 05/10/2017 1138   HGB 10.9 (L) 06/11/2009 1303   HCT 40.9 05/10/2017 1138   HCT 33.6 (L) 06/11/2009 1303   PLT 232 05/10/2017 1138   MCV 89 05/10/2017 1138   MCV 78.3 (L) 06/11/2009 1303   MCH 27.9 05/10/2017 1138   MCH 29.2 07/26/2012 0555   MCHC 31.3 (L) 05/10/2017 1138   MCHC 31.3 07/26/2012 0555   RDW 14.6 05/10/2017 1138   RDW 16.3 (H) 06/11/2009 1303   LYMPHSABS 1.3 05/10/2017 1138   LYMPHSABS 1.3  06/11/2009 1303   MONOABS 0.6 07/20/2012 0848   MONOABS 0.7 06/11/2009 1303   EOSABS 0.2 05/10/2017 1138   BASOSABS 0.0 05/10/2017 1138   BASOSABS 0.0 06/11/2009 1303   Iron/TIBC/Ferritin/ %Sat    Component Value Date/Time   IRON 31 (L) 06/11/2009 1303   TIBC 420 06/11/2009 1303   FERRITIN 9 (L) 06/11/2009 1303   IRONPCTSAT 7 (L) 06/11/2009 1303   Lipid Panel     Component Value Date/Time   CHOL 162 09/14/2017 0902   TRIG 114 09/14/2017 0902   HDL 42 09/14/2017 0902   LDLCALC 97 09/14/2017 0902   Hepatic Function Panel     Component Value Date/Time   PROT 6.3 09/14/2017 0902   ALBUMIN 4.1 09/14/2017 0902   AST 16 09/14/2017 0902   ALT 17 09/14/2017 0902   ALKPHOS 80 09/14/2017 0902   BILITOT 0.3 09/14/2017 0902      Component Value Date/Time   TSH 3.430 09/14/2017 0902   TSH 2.670 05/10/2017 1138     Ref. Range 09/14/2017 09:02  Vitamin D, 25-Hydroxy Latest Ref Range: 30.0 - 100.0 ng/mL 54.3   ASSESSMENT AND PLAN: Type 2 diabetes mellitus without complication, without long-term current use of insulin ([facility]) - Plan: liraglutide (VICTOZA) 18 MG/3ML SOPN  Vitamin D deficiency  Class 3 severe obesity with serious comorbidity and body mass index (BMI) of 40.0 to 44.9 in adult, unspecified obesity type ([place])  PLAN:  Diabetes II Dorice has been given extensive diabetes education  by myself today including ideal fasting and post-prandial blood glucose readings, individual ideal Hgb A1c goals and hypoglycemia prevention. We discussed the importance of good blood sugar control to decrease the likelihood of diabetic complications such as nephropathy, neuropathy, limb loss, blindness, coronary artery disease, and death. We discussed the importance of intensive lifestyle modification including diet, exercise and weight loss as the first line treatment for diabetes. Eryn agrees to continue monitoring her blood sugar and restart metformin. She agrees to continue victoza at 0.6 mg  daily #1 pen with no refills and follow up at the agreed upon time.  Vitamin D Deficiency Jabrea was informed that low vitamin D levels contributes to fatigue and are associated with obesity, breast, and colon cancer. She agrees to continue to take prescription Vit D '@50' ,000 IU every week and will follow up for routine testing of vitamin D, at least 2-3 times per year. She was informed of the risk of over-replacement of vitamin D and agrees to not increase her dose unless she discusses this with [place] first.  Obesity Mita is currently in the action stage of change. As such, her goal is to continue with weight loss efforts She has agreed to follow the Category 2 plan Jayna has been instructed to work up to a goal of 150 minutes of combined cardio and strengthening exercise per week for weight loss and overall health benefits. We discussed the following Behavioral Modification Strategies today: increasing lean protein intake, decreasing simple carbohydrates  and work on meal planning and easy cooking plans  Bethannie has agreed to follow up with our clinic in 2 weeks. She was informed of the importance of frequent follow up visits to maximize her success with intensive lifestyle modifications for her multiple health conditions.   OBESITY BEHAVIORAL INTERVENTION VISIT  Today's visit was # 12 out of 22.  Starting weight: 232 lbs Starting date: 05/10/17 Today's weight : 206 lbs Today's date: 11/02/2017 Total lbs lost to date: 8 (Patients must lose 7 lbs in the first 6 months to continue with counseling)   ASK: We discussed the diagnosis of obesity with Rosamaria Lints today and Alyx agreed to give [place] permission to discuss obesity behavioral modification therapy today.  ASSESS: Ajaya has the diagnosis of obesity and her BMI today is 44.57 Aiysha is in the action stage of change   ADVISE: Marlyce was educated on the multiple health risks of obesity as well as the benefit of weight loss to  improve her health. She was advised of the need for long term treatment and the importance of lifestyle modifications.  AGREE: Multiple dietary modification options and treatment options were discussed and  Yashika agreed to the above obesity treatment plan.  I, Doreene Nest, am acting as transcriptionist for Dennard Nip, MD  I have reviewed the above documentation for accuracy and completeness, and I agree with the above. -Dennard Nip, MD

## 2017-11-07 ENCOUNTER — Other Ambulatory Visit (INDEPENDENT_AMBULATORY_CARE_PROVIDER_SITE_OTHER): Payer: Self-pay | Admitting: Physician Assistant

## 2017-11-07 ENCOUNTER — Ambulatory Visit (INDEPENDENT_AMBULATORY_CARE_PROVIDER_SITE_OTHER): Payer: Medicare Other | Admitting: Family Medicine

## 2017-11-07 DIAGNOSIS — E119 Type 2 diabetes mellitus without complications: Secondary | ICD-10-CM

## 2017-11-08 ENCOUNTER — Other Ambulatory Visit (INDEPENDENT_AMBULATORY_CARE_PROVIDER_SITE_OTHER): Payer: Self-pay | Admitting: Family Medicine

## 2017-11-08 ENCOUNTER — Other Ambulatory Visit: Payer: Medicare Other

## 2017-11-08 DIAGNOSIS — E119 Type 2 diabetes mellitus without complications: Secondary | ICD-10-CM

## 2017-11-09 ENCOUNTER — Ambulatory Visit
Admission: RE | Admit: 2017-11-09 | Discharge: 2017-11-09 | Disposition: A | Discharge status: Home / Self Care | Payer: Medicare Other | Source: Ambulatory Visit | Attending: Neurology | Admitting: Neurology

## 2017-11-09 ENCOUNTER — Ambulatory Visit: Payer: Medicare Other | Admitting: Neurology

## 2017-11-09 DIAGNOSIS — G35 Multiple sclerosis: Secondary | ICD-10-CM

## 2017-11-14 ENCOUNTER — Telehealth: Payer: Self-pay

## 2017-11-14 NOTE — Telephone Encounter (Signed)
-----   Message from Britt Bottom, MD sent at 11/10/2017  5:15 PM EST ----- Please let her know that the MRI of the brain looks good. There are no lesions

## 2017-11-14 NOTE — Telephone Encounter (Signed)
Spoke with [name], and per RAS, advised ok to stop Betaseron; call if she has any new or worsening sx, and will monitor yearly MRI's for any progression of disease.  She is agreeable with this plan, will call if she has any questions/concerns/new or worsening sx/ifm

## 2017-11-14 NOTE — Telephone Encounter (Signed)
She doesn't qualify for pt. assistance for Betaseron and can't afford the $5,000 per yr. copay. At last ov, she discussed switching to Avonex or Rebif to see if she would qualify for pt. assistance for one of these meds, or stopping dmt altogether and checking yearly MRI brain, as her MS has been very mild, stable.  Recent MRI brain was ok. Will check with RAS and let her know something/fim

## 2017-11-14 NOTE — Telephone Encounter (Signed)
I spoke with patient and she is aware of her MRI results. She is wondering if Dr. Felecia Shelling had made a decision about her Betaseron? She said that they had discussed possibly changing the dose or frequency based on what the MRI showed.

## 2017-11-16 ENCOUNTER — Ambulatory Visit (INDEPENDENT_AMBULATORY_CARE_PROVIDER_SITE_OTHER): Payer: Medicare Other | Admitting: Family Medicine

## 2017-11-18 ENCOUNTER — Ambulatory Visit (INDEPENDENT_AMBULATORY_CARE_PROVIDER_SITE_OTHER): Payer: Medicare Other | Admitting: Family Medicine

## 2017-11-21 ENCOUNTER — Ambulatory Visit (INDEPENDENT_AMBULATORY_CARE_PROVIDER_SITE_OTHER): Payer: Medicare Other | Admitting: Family Medicine

## 2017-11-21 VITALS — BP 126/73 | HR 80 | Temp 98.1°F | Ht <= 58 in | Wt 208.0 lb

## 2017-11-21 DIAGNOSIS — E119 Type 2 diabetes mellitus without complications: Secondary | ICD-10-CM

## 2017-11-21 DIAGNOSIS — Z6841 Body Mass Index (BMI) 40.0 and over, adult: Secondary | ICD-10-CM | POA: Diagnosis not present

## 2017-11-21 NOTE — Progress Notes (Signed)
Office: [phone]  /  Fax: [phone]   HPI:   Chief Complaint: OBESITY Jocelyn Wilson is here to discuss her progress with her obesity treatment plan. She is on the Category 2 plan and is following her eating plan approximately 60 % of the time. She states she is exercising 0 minutes 0 times per week. Jocelyn Wilson has struggled to follow her Category 2 plan. She increased eating out and not planning ahead much. She felt she did better on the low carbohydrate previously.  Her weight is 208 lb (94.3 kg) today and has gained 2 pounds since her last visit. She has lost 24 lbs since starting treatment with [place].  Diabetes II Jocelyn Wilson has a diagnosis of diabetes type II. Jocelyn Wilson states she is not checking her BGs often but struggling to follow her diet prescription. Last A1c had improved to 6.5 and she notes polyphagia and denies any hypoglycemic episodes. She has been working on intensive lifestyle modifications including diet, exercise, and weight loss to help control her blood glucose levels.  ALLERGIES: Allergies  Allergen Reactions  . Iron Anaphylaxis    Iv iron  . Ciprofloxacin Nausea Only  . Contrast Media [Iodinated Diagnostic Agents] Itching and Rash  . Sulfa Antibiotics Itching and Rash  . Tape Rash    Adhesive tape    MEDICATIONS: Current Outpatient Medications on File Prior to Visit  Medication Sig Dispense Refill  . atorvastatin (LIPITOR) 20 MG tablet Take 20 mg by mouth daily.    . busPIRone (BUSPAR) 30 MG tablet TAKE 1 TABLET BY MOUTH 3  TIMES DAILY 270 tablet 1  . esomeprazole (NEXIUM) 40 MG capsule TAKE 1 CAPSULE BY MOUTH TWO TIMES DAILY 180 capsule 1  . ferrous sulfate 325 (65 FE) MG tablet Take 325 mg by mouth daily.     Marland Kitchen FLOVENT HFA 220 MCG/ACT inhaler Inhale 1 puff into the lungs daily.   1  . Ibuprofen (ADVIL) 200 MG CAPS Take 2 capsules by mouth 3 (three) times daily as needed. For migraines    . Insulin Pen Needle 32G X 4 MM MISC 1 Package by Does not apply route daily at  12 noon. 100 each 0  . Interferon Beta-1b (BETASERON) 0.3 MG KIT injection INJECT 0.25 MG UNDER THE SKIN EVERY OTHER DAY AS DIRECTED 42 each 2  . levothyroxine (SYNTHROID, LEVOTHROID) 100 MCG tablet TK 1 T PO QD  0  . liraglutide (VICTOZA) 18 MG/3ML SOPN Inject 0.1 mLs (0.6 mg total) into the skin every morning. 1 pen 0  . meclizine (ANTIVERT) 12.5 MG tablet TAKE 1 TABLET(12.5 MG) BY MOUTH THREE TIMES DAILY AS NEEDED FOR DIZZINESS 270 tablet 1  . metFORMIN (GLUCOPHAGE) 500 MG tablet Take 500 mg by mouth 2 (two) times daily with a meal.    . metoprolol succinate (TOPROL-XL) 50 MG 24 hr tablet TAKE 1 TABLET BY MOUTH  DAILY 90 tablet 3  . ONE TOUCH ULTRA TEST test strip USE TO CHECK FASTING AND PRE-DINNER BLOOD GLUCOSE BID  12  . ONETOUCH DELICA LANCETS 71I MISC USE TO CHECK FASTING AND PRE-DINNER BLOOD GLUCOSE BID  12  . potassium citrate (UROCIT-K) 10 MEQ (1080 MG) SR tablet Take 10 mEq by mouth 3 (three) times daily with meals.    . ranitidine (ZANTAC) 75 MG tablet Take 75 mg by mouth at bedtime.    Marland Kitchen rOPINIRole (REQUIP) 1 MG tablet TAKE 1 TABLET BY MOUTH 4  TIMES DAILY 360 tablet 3  . sertraline (ZOLOFT) 100 MG tablet  TAKE 1 AND 1/2 TABLETS BY  MOUTH DAILY 135 tablet 3  . traMADol (ULTRAM) 50 MG tablet Take 1 tablet (50 mg total) by mouth 3 (three) times daily as needed. 90 tablet 3  . traZODone (DESYREL) 100 MG tablet TAKE 1/2 TO 1 TABLET BY  MOUTH NIGHTLY FOR SLEEP 90 tablet 1  . vitamin B-12 (CYANOCOBALAMIN) 100 MCG tablet Take 100 mcg by mouth daily.    . Vitamin D, Ergocalciferol, (DRISDOL) 50000 units CAPS capsule Take 1 capsule (50,000 Units total) by mouth every 7 (seven) days. 4 capsule 0   No current facility-administered medications on file prior to visit.     PAST MEDICAL HISTORY: Past Medical History:  Diagnosis Date  . Allergy   . Anxiety   . Asthma   . Back pain   . Chronic kidney disease    KIDNEY STONES WITH STENTS  . Depression   . Diabetes ([place])   . Diverticula of  colon   . Dyspnea   . Eczema   . Food allergy   . Gallbladder problem   . GERD (gastroesophageal reflux disease)   . Hypercholesterolemia   . Hyperlipidemia   . Hypothyroidism   . Joint pain   . Kidney stones   . Leg edema   . Migraine headache   . Jocelyn Wilson ([place])   . Obesity   . Palpitations   . PONV (postoperative nausea and vomiting)   . Restless leg syndrome   . Right sided sciatica 10/29/2015  . Snoring   . Thyroid disease    hyperthyroidism  . Unsteady gait   . Vertigo   . Vision abnormalities     PAST SURGICAL HISTORY: Past Surgical History:  Procedure Laterality Date  . BREAST SURGERY     AUGMENTATION  . carpel tunnel    . CHOLECYSTECTOMY    . DILATION AND CURETTAGE OF UTERUS  1983  . FINGER SURGERY  x8  . HERNIA REPAIR    . LITHOTRIPSY    . NISSEN FUNDOPLICATION    . VENTRAL HERNIA REPAIR  07/25/2012   Procedure: LAPAROSCOPIC VENTRAL HERNIA;  Surgeon: Adin Hector, MD;  Location: [facility];  Service: General;  Laterality: N/A;    SOCIAL HISTORY: Social History   Tobacco Use  . Smoking status: Former Research scientist (life sciences)  . Smokeless tobacco: Never Used  Substance Use Topics  . Alcohol use: No  . Drug use: No    FAMILY HISTORY: Family History  Problem Relation Age of Onset  . Hyperlipidemia Mother   . Glaucoma Mother   . Hypertension Mother   . Anxiety disorder Mother   . Depression Mother   . Heart disease Father   . Cancer Father        prostate  . Diabetes Father   . Dementia Father   . Hypertension Father   . Hyperlipidemia Father   . Sleep apnea Father   . Obesity Father   . Cancer Brother        esophageal  . Arthritis Sister        RA    ROS: Review of Systems  Constitutional: Negative for weight loss.  Endo/Heme/Allergies:       Positive polyphagia    PHYSICAL EXAM: Blood pressure 126/73, pulse 80, temperature 98.1 F (36.7 C), temperature source Oral, height '4\' 9"'  (1.448 m), weight 208 lb (94.3 kg), SpO2 97 %. Body mass  index is 45.01 kg/m. Physical Exam  Constitutional: She is oriented to person, place, and time.  She appears well-developed and well-nourished.  Cardiovascular: Normal rate.  Pulmonary/Chest: Effort normal.  Musculoskeletal: Normal range of motion.  Neurological: She is oriented to person, place, and time.  Skin: Skin is warm and dry.  Psychiatric: She has a normal mood and affect. Her behavior is normal.  Vitals reviewed.   RECENT LABS AND TESTS: BMET    Component Value Date/Time   NA 142 09/14/2017 0902   K 4.6 09/14/2017 0902   CL 103 09/14/2017 0902   CO2 23 09/14/2017 0902   GLUCOSE 126 (H) 09/14/2017 0902   GLUCOSE 127 (H) 07/26/2012 0555   BUN 15 09/14/2017 0902   CREATININE 0.71 09/14/2017 0902   CALCIUM 9.1 09/14/2017 0902   GFRNONAA 89 09/14/2017 0902   GFRAA 103 09/14/2017 0902   Lab Results  Component Value Date   HGBA1C 6.5 (H) 09/14/2017   HGBA1C 7.5 (H) 05/10/2017   Lab Results  Component Value Date   INSULIN 26.4 (H) 09/14/2017   INSULIN 49.2 (H) 05/10/2017   CBC    Component Value Date/Time   WBC 6.2 05/10/2017 1138   WBC 6.6 07/26/2012 0555   RBC 4.58 05/10/2017 1138   RBC 3.77 (L) 07/26/2012 0555   HGB 12.8 05/10/2017 1138   HGB 10.9 (L) 06/11/2009 1303   HCT 40.9 05/10/2017 1138   HCT 33.6 (L) 06/11/2009 1303   PLT 232 05/10/2017 1138   MCV 89 05/10/2017 1138   MCV 78.3 (L) 06/11/2009 1303   MCH 27.9 05/10/2017 1138   MCH 29.2 07/26/2012 0555   MCHC 31.3 (L) 05/10/2017 1138   MCHC 31.3 07/26/2012 0555   RDW 14.6 05/10/2017 1138   RDW 16.3 (H) 06/11/2009 1303   LYMPHSABS 1.3 05/10/2017 1138   LYMPHSABS 1.3 06/11/2009 1303   MONOABS 0.6 07/20/2012 0848   MONOABS 0.7 06/11/2009 1303   EOSABS 0.2 05/10/2017 1138   BASOSABS 0.0 05/10/2017 1138   BASOSABS 0.0 06/11/2009 1303   Iron/TIBC/Ferritin/ %Sat    Component Value Date/Time   IRON 31 (L) 06/11/2009 1303   TIBC 420 06/11/2009 1303   FERRITIN 9 (L) 06/11/2009 1303   IRONPCTSAT  7 (L) 06/11/2009 1303   Lipid Panel     Component Value Date/Time   CHOL 162 09/14/2017 0902   TRIG 114 09/14/2017 0902   HDL 42 09/14/2017 0902   LDLCALC 97 09/14/2017 0902   Hepatic Function Panel     Component Value Date/Time   PROT 6.3 09/14/2017 0902   ALBUMIN 4.1 09/14/2017 0902   AST 16 09/14/2017 0902   ALT 17 09/14/2017 0902   ALKPHOS 80 09/14/2017 0902   BILITOT 0.3 09/14/2017 0902      Component Value Date/Time   TSH 3.430 09/14/2017 0902   TSH 2.670 05/10/2017 1138    ASSESSMENT AND PLAN: Type 2 diabetes mellitus without complication, without long-term current use of insulin (HCC)  Class 3 severe obesity with serious comorbidity and body mass index (BMI) of 45.0 to 49.9 in adult, unspecified obesity type ([place])  PLAN:  Diabetes II Jocelyn Wilson has been given extensive diabetes education by myself today including ideal fasting and post-prandial blood glucose readings, individual ideal Hgb A1c goals and hypoglycemia prevention. We discussed the importance of good blood sugar control to decrease the likelihood of diabetic complications such as nephropathy, neuropathy, limb loss, blindness, coronary artery disease, and death. We discussed the importance of intensive lifestyle modification including diet, exercise and weight loss as the first line treatment for diabetes. Jocelyn Wilson agrees to continue [employer]  and check BGs and change to lower carbohydrate plan. We will recheck labs in 1 month and Jocelyn Wilson agrees to follow up with our clinic in 2 weeks with Dr. Adair Patter.  We spent > than 50% of the 15 minute visit on the counseling as documented in the note.  Obesity Jocelyn Wilson is currently in the action stage of change. As such, her goal is to continue with weight loss efforts She has agreed to change to follow a lower carbohydrate, vegetable and lean protein rich diet plan Jocelyn Wilson has been instructed to work up to a goal of 150 minutes of combined cardio and strengthening exercise per  week for weight loss and overall health benefits. We discussed the following Behavioral Modification Strategies today: increasing lean protein intake, decreasing simple carbohydrates  and work on meal planning and easy cooking plans   Jocelyn Wilson has agreed to follow up with our clinic in 2 weeks with Dr. Adair Patter. She was informed of the importance of frequent follow up visits to maximize her success with intensive lifestyle modifications for her Jocelyn Wilson health conditions.   OBESITY BEHAVIORAL INTERVENTION VISIT  Today's visit was # 13 out of 22.  Starting weight: 232 lbs Starting date: 05/10/17 Today's weight : 208 lbs  Today's date: 11/21/2017 Total lbs lost to date: 24 (Patients must lose 7 lbs in the first 6 months to continue with counseling)   ASK: We discussed the diagnosis of obesity with Jocelyn Wilson today and Jocelyn Wilson agreed to give [place] permission to discuss obesity behavioral modification therapy today.  ASSESS: Jocelyn Wilson has the diagnosis of obesity and her BMI today is 6 Jocelyn Wilson is in the action stage of change   ADVISE: Jocelyn Wilson was educated on the Jocelyn Wilson health risks of obesity as well as the benefit of weight loss to improve her health. She was advised of the need for long term treatment and the importance of lifestyle modifications.  AGREE: Jocelyn Wilson dietary modification options and treatment options were discussed and  Jocelyn Wilson agreed to the above obesity treatment plan.  I, Trixie Dredge, am acting as transcriptionist for Dennard Nip, MD  I have reviewed the above documentation for accuracy and completeness, and I agree with the above. -Dennard Nip, MD

## 2017-11-30 ENCOUNTER — Telehealth (INDEPENDENT_AMBULATORY_CARE_PROVIDER_SITE_OTHER): Payer: Self-pay | Admitting: Family Medicine

## 2017-11-30 NOTE — Telephone Encounter (Signed)
Spoke with the patient who states when she first used it she discovered a pimple at the sight and since then itching around the sight. After informing Dr Leafy Ro called the pt back and told her to make sure she is injecting with the hub touching the skin so that the medication is going in properly. Also she will need to start taking it at night with Benadryl until the itching subsides. Asked the pt to call the office back in the event the itching returns. Pt verbalized understanding. April, [place]

## 2017-11-30 NOTE — Telephone Encounter (Signed)
Reaction to victoza , itching at the site.  What to do?  [phone]

## 2017-12-05 ENCOUNTER — Ambulatory Visit (INDEPENDENT_AMBULATORY_CARE_PROVIDER_SITE_OTHER): Payer: Medicare Other | Admitting: Family Medicine

## 2017-12-05 VITALS — BP 114/70 | HR 80 | Temp 97.9°F | Ht <= 58 in | Wt 206.0 lb

## 2017-12-05 DIAGNOSIS — E559 Vitamin D deficiency, unspecified: Secondary | ICD-10-CM

## 2017-12-05 DIAGNOSIS — E119 Type 2 diabetes mellitus without complications: Secondary | ICD-10-CM

## 2017-12-05 DIAGNOSIS — Z6841 Body Mass Index (BMI) 40.0 and over, adult: Secondary | ICD-10-CM | POA: Diagnosis not present

## 2017-12-05 MED ORDER — METFORMIN HCL 500 MG PO TABS: 500.0000 mg | ORAL_TABLET | Freq: Two times a day (BID) | ORAL | 0 refills | Status: DC

## 2017-12-05 MED ORDER — VITAMIN D (ERGOCALCIFEROL) 1.25 MG (50000 UNIT) PO CAPS: 50000.0000 [IU] | ORAL_CAPSULE | ORAL | 0 refills | Status: DC

## 2017-12-06 NOTE — Progress Notes (Signed)
Office: [phone]  /  Fax: [phone]   HPI:   Chief Complaint: OBESITY Jocelyn Wilson is here to discuss her progress with her obesity treatment plan. She is on the lower carbohydrate, vegetable and lean protein rich diet plan and is following her eating plan approximately 60 % of the time. She states she is exercising 0 minutes 0 times per week. Jocelyn Wilson feels like she is in a holding pattern. Her weight is 206 lb (93.4 kg) today and has had a weight loss of 2 pounds over a period of 2 weeks since her last visit. She has lost 26 lbs since starting treatment with [place].  Vitamin D deficiency Jocelyn Wilson has a diagnosis of vitamin D deficiency. She is currently taking vit D and admits fatigue but denies nausea, vomiting or muscle weakness.   Ref. Range 09/14/2017 09:02  Vitamin D, 25-Hydroxy Latest Ref Range: 30.0 - 100.0 ng/mL 54.3   Diabetes II Jocelyn Wilson has a diagnosis of diabetes type II. Jocelyn Wilson states fasting BGs range between 135 and 144 and she denies any hypoglycemic episodes. Jocelyn Wilson felt she was having a reaction to victoza injection, but was feeling seborrheic keratosis on her abdomen, not injection site reaction. She has been working on intensive lifestyle modifications including diet, exercise, and weight loss to help control her blood glucose levels.  ALLERGIES: Allergies  Allergen Reactions  . Iron Anaphylaxis    Iv iron  . Ciprofloxacin Nausea Only  . Contrast Media [Iodinated Diagnostic Agents] Itching and Rash  . Sulfa Antibiotics Itching and Rash  . Tape Rash    Adhesive tape    MEDICATIONS: Current Outpatient Medications on File Prior to Visit  Medication Sig Dispense Refill  . atorvastatin (LIPITOR) 20 MG tablet Take 20 mg by mouth daily.    . busPIRone (BUSPAR) 30 MG tablet TAKE 1 TABLET BY MOUTH 3  TIMES DAILY 270 tablet 1  . esomeprazole (NEXIUM) 40 MG capsule TAKE 1 CAPSULE BY MOUTH TWO TIMES DAILY 180 capsule 1  . ferrous sulfate 325 (65 FE) MG tablet Take 325 mg by  mouth daily.     Marland Kitchen FLOVENT HFA 220 MCG/ACT inhaler Inhale 1 puff into the lungs daily.   1  . Ibuprofen (ADVIL) 200 MG CAPS Take 2 capsules by mouth 3 (three) times daily as needed. For migraines    . Insulin Pen Needle 32G X 4 MM MISC 1 Package by Does not apply route daily at 12 noon. 100 each 0  . Interferon Beta-1b (BETASERON) 0.3 MG KIT injection INJECT 0.25 MG UNDER THE SKIN EVERY OTHER DAY AS DIRECTED 42 each 2  . levothyroxine (SYNTHROID, LEVOTHROID) 100 MCG tablet TK 1 T PO QD  0  . liraglutide (VICTOZA) 18 MG/3ML SOPN Inject 0.1 mLs (0.6 mg total) into the skin every morning. 1 pen 0  . meclizine (ANTIVERT) 12.5 MG tablet TAKE 1 TABLET(12.5 MG) BY MOUTH THREE TIMES DAILY AS NEEDED FOR DIZZINESS 270 tablet 1  . metoprolol succinate (TOPROL-XL) 50 MG 24 hr tablet TAKE 1 TABLET BY MOUTH  DAILY 90 tablet 3  . ONE TOUCH ULTRA TEST test strip USE TO CHECK FASTING AND PRE-DINNER BLOOD GLUCOSE BID  12  . ONETOUCH DELICA LANCETS 75I MISC USE TO CHECK FASTING AND PRE-DINNER BLOOD GLUCOSE BID  12  . potassium citrate (UROCIT-K) 10 MEQ (1080 MG) SR tablet Take 10 mEq by mouth 3 (three) times daily with meals.    . ranitidine (ZANTAC) 75 MG tablet Take 75 mg by mouth at bedtime.    Marland Kitchen  rOPINIRole (REQUIP) 1 MG tablet TAKE 1 TABLET BY MOUTH 4  TIMES DAILY 360 tablet 3  . sertraline (ZOLOFT) 100 MG tablet TAKE 1 AND 1/2 TABLETS BY  MOUTH DAILY 135 tablet 3  . traMADol (ULTRAM) 50 MG tablet Take 1 tablet (50 mg total) by mouth 3 (three) times daily as needed. 90 tablet 3  . traZODone (DESYREL) 100 MG tablet TAKE 1/2 TO 1 TABLET BY  MOUTH NIGHTLY FOR SLEEP 90 tablet 1  . vitamin B-12 (CYANOCOBALAMIN) 100 MCG tablet Take 100 mcg by mouth daily.     No current facility-administered medications on file prior to visit.     PAST MEDICAL HISTORY: Past Medical History:  Diagnosis Date  . Allergy   . Anxiety   . Asthma   . Back pain   . Chronic kidney disease    KIDNEY STONES WITH STENTS  . Depression    . Diabetes ([place])   . Diverticula of colon   . Dyspnea   . Eczema   . Food allergy   . Gallbladder problem   . GERD (gastroesophageal reflux disease)   . Hypercholesterolemia   . Hyperlipidemia   . Hypothyroidism   . Joint pain   . Kidney stones   . Leg edema   . Migraine headache   . Multiple sclerosis ([place])   . Obesity   . Palpitations   . PONV (postoperative nausea and vomiting)   . Restless leg syndrome   . Right sided sciatica 10/29/2015  . Snoring   . Thyroid disease    hyperthyroidism  . Unsteady gait   . Vertigo   . Vision abnormalities     PAST SURGICAL HISTORY: Past Surgical History:  Procedure Laterality Date  . BREAST SURGERY     AUGMENTATION  . carpel tunnel    . CHOLECYSTECTOMY    . DILATION AND CURETTAGE OF UTERUS  1983  . FINGER SURGERY  x8  . HERNIA REPAIR    . LITHOTRIPSY    . NISSEN FUNDOPLICATION    . VENTRAL HERNIA REPAIR  07/25/2012   Procedure: LAPAROSCOPIC VENTRAL HERNIA;  Surgeon: Adin Hector, MD;  Location: [facility];  Service: General;  Laterality: N/A;    SOCIAL HISTORY: Social History   Tobacco Use  . Smoking status: Former Research scientist (life sciences)  . Smokeless tobacco: Never Used  Substance Use Topics  . Alcohol use: No  . Drug use: No    FAMILY HISTORY: Family History  Problem Relation Age of Onset  . Hyperlipidemia Mother   . Glaucoma Mother   . Hypertension Mother   . Anxiety disorder Mother   . Depression Mother   . Heart disease Father   . Cancer Father        prostate  . Diabetes Father   . Dementia Father   . Hypertension Father   . Hyperlipidemia Father   . Sleep apnea Father   . Obesity Father   . Cancer Brother        esophageal  . Arthritis Sister        RA    ROS: Review of Systems  Constitutional: Positive for malaise/fatigue and weight loss.  Gastrointestinal: Negative for nausea and vomiting.  Musculoskeletal:       Negative for muscle weakness    PHYSICAL EXAM: Blood pressure 114/70, pulse 80,  temperature 97.9 F (36.6 C), temperature source Oral, height '4\' 9"'  (1.448 m), weight 206 lb (93.4 kg), SpO2 96 %. Body mass index is 44.58 kg/m. Physical Exam  Constitutional: She is oriented to person, place, and time. She appears well-developed and well-nourished.  Cardiovascular: Normal rate.  Pulmonary/Chest: Effort normal.  Musculoskeletal: Normal range of motion.  Neurological: She is oriented to person, place, and time.  Skin: Skin is warm and dry.  Psychiatric: She has a normal mood and affect. Her behavior is normal.  Vitals reviewed.   RECENT LABS AND TESTS: BMET    Component Value Date/Time   NA 142 09/14/2017 0902   K 4.6 09/14/2017 0902   CL 103 09/14/2017 0902   CO2 23 09/14/2017 0902   GLUCOSE 126 (H) 09/14/2017 0902   GLUCOSE 127 (H) 07/26/2012 0555   BUN 15 09/14/2017 0902   CREATININE 0.71 09/14/2017 0902   CALCIUM 9.1 09/14/2017 0902   GFRNONAA 89 09/14/2017 0902   GFRAA 103 09/14/2017 0902   Lab Results  Component Value Date   HGBA1C 6.5 (H) 09/14/2017   HGBA1C 7.5 (H) 05/10/2017   Lab Results  Component Value Date   INSULIN 26.4 (H) 09/14/2017   INSULIN 49.2 (H) 05/10/2017   CBC    Component Value Date/Time   WBC 6.2 05/10/2017 1138   WBC 6.6 07/26/2012 0555   RBC 4.58 05/10/2017 1138   RBC 3.77 (L) 07/26/2012 0555   HGB 12.8 05/10/2017 1138   HGB 10.9 (L) 06/11/2009 1303   HCT 40.9 05/10/2017 1138   HCT 33.6 (L) 06/11/2009 1303   PLT 232 05/10/2017 1138   MCV 89 05/10/2017 1138   MCV 78.3 (L) 06/11/2009 1303   MCH 27.9 05/10/2017 1138   MCH 29.2 07/26/2012 0555   MCHC 31.3 (L) 05/10/2017 1138   MCHC 31.3 07/26/2012 0555   RDW 14.6 05/10/2017 1138   RDW 16.3 (H) 06/11/2009 1303   LYMPHSABS 1.3 05/10/2017 1138   LYMPHSABS 1.3 06/11/2009 1303   MONOABS 0.6 07/20/2012 0848   MONOABS 0.7 06/11/2009 1303   EOSABS 0.2 05/10/2017 1138   BASOSABS 0.0 05/10/2017 1138   BASOSABS 0.0 06/11/2009 1303   Iron/TIBC/Ferritin/ %Sat      Component Value Date/Time   IRON 31 (L) 06/11/2009 1303   TIBC 420 06/11/2009 1303   FERRITIN 9 (L) 06/11/2009 1303   IRONPCTSAT 7 (L) 06/11/2009 1303   Lipid Panel     Component Value Date/Time   CHOL 162 09/14/2017 0902   TRIG 114 09/14/2017 0902   HDL 42 09/14/2017 0902   LDLCALC 97 09/14/2017 0902   Hepatic Function Panel     Component Value Date/Time   PROT 6.3 09/14/2017 0902   ALBUMIN 4.1 09/14/2017 0902   AST 16 09/14/2017 0902   ALT 17 09/14/2017 0902   ALKPHOS 80 09/14/2017 0902   BILITOT 0.3 09/14/2017 0902      Component Value Date/Time   TSH 3.430 09/14/2017 0902   TSH 2.670 05/10/2017 1138    Ref. Range 09/14/2017 09:02  Vitamin D, 25-Hydroxy Latest Ref Range: 30.0 - 100.0 ng/mL 54.3   ASSESSMENT AND PLAN: Type 2 diabetes mellitus without complication, without long-term current use of insulin (HCC) - Plan: metFORMIN (GLUCOPHAGE) 500 MG tablet  Vitamin D deficiency - Plan: Vitamin D, Ergocalciferol, (DRISDOL) 50000 units CAPS capsule  Class 3 severe obesity with serious comorbidity and body mass index (BMI) of 40.0 to 44.9 in adult, unspecified obesity type ([place])  PLAN:  Vitamin D Deficiency Jocelyn Wilson was informed that low vitamin D levels contributes to fatigue and are associated with obesity, breast, and colon cancer. She agrees to continue to take prescription Vit D '@50' ,000  IU every week #4 with no refills and will follow up for routine testing of vitamin D, at least 2-3 times per year. She was informed of the risk of over-replacement of vitamin D and agrees to not increase her dose unless she discusses this with [place] first. We will repeat labs in 2 months and Sharonne agreed to follow up as directed.  Diabetes II Jocelyn Wilson has been given extensive diabetes education by myself today including ideal fasting and post-prandial blood glucose readings, individual ideal Hgb A1c goals  and hypoglycemia prevention. We discussed the importance of good blood sugar control to  decrease the likelihood of diabetic complications such as nephropathy, neuropathy, limb loss, blindness, coronary artery disease, and death. We discussed the importance of intensive lifestyle modification including diet, exercise and weight loss as the first line treatment for diabetes. Jocelyn Wilson agrees to continue metformin 500 mg bid #0 with no refills and will follow up at the agreed upon time.  Obesity Jocelyn Wilson is currently in the action stage of change. As such, her goal is to continue with weight loss efforts She has agreed to keep a food journal with 1200 to 1300 calories and 85 grams of protein daily Jocelyn Wilson has been instructed to work up to a goal of 150 minutes of combined cardio and strengthening exercise per week for weight loss and overall health benefits. We discussed the following Behavioral Modification Strategies today: better snacking choices, planning for success, keep a strict food journal, increasing lean protein intake and work on meal planning and easy cooking plans  Jocelyn Wilson has agreed to follow up with our clinic in 2 weeks. She was informed of the importance of frequent follow up visits to maximize her success with intensive lifestyle modifications for her multiple health conditions.   OBESITY BEHAVIORAL INTERVENTION VISIT  Today's visit was # 14 out of 22.  Starting weight: 232 lbs Starting date: 05/10/17 Today's weight : 206 lbs Today's date: 12/05/2017 Total lbs lost to date: 59 (Patients must lose 7 lbs in the first 6 months to continue with counseling)   ASK: We discussed the diagnosis of obesity with Jocelyn Wilson today and Amiri agreed to give [place] permission to discuss obesity behavioral modification therapy today.  ASSESS: Jocelyn Wilson has the diagnosis of obesity and her BMI today is 44.57 Jocelyn Wilson is in the action stage of change   ADVISE: Jocelyn Wilson was educated on the multiple health risks of obesity as well as the benefit of weight loss to improve her health. She was  advised of the need for long term treatment and the importance of lifestyle modifications.  AGREE: Multiple dietary modification options and treatment options were discussed and  Jocelyn Wilson agreed to the above obesity treatment plan.  I, Doreene Nest, am acting as transcriptionist for Eber Jones, MD  I have reviewed the above documentation for accuracy and completeness, and I agree with the above. - Ilene Qua, MD

## 2017-12-14 ENCOUNTER — Other Ambulatory Visit (INDEPENDENT_AMBULATORY_CARE_PROVIDER_SITE_OTHER): Payer: Self-pay | Admitting: Family Medicine

## 2017-12-14 DIAGNOSIS — E119 Type 2 diabetes mellitus without complications: Secondary | ICD-10-CM

## 2017-12-19 ENCOUNTER — Ambulatory Visit (INDEPENDENT_AMBULATORY_CARE_PROVIDER_SITE_OTHER): Payer: Medicare Other | Admitting: Family Medicine

## 2017-12-19 VITALS — BP 112/74 | HR 98 | Temp 97.7°F | Ht <= 58 in | Wt 206.0 lb

## 2017-12-19 DIAGNOSIS — E559 Vitamin D deficiency, unspecified: Secondary | ICD-10-CM | POA: Diagnosis not present

## 2017-12-19 DIAGNOSIS — Z6841 Body Mass Index (BMI) 40.0 and over, adult: Secondary | ICD-10-CM | POA: Diagnosis not present

## 2017-12-19 DIAGNOSIS — E1169 Type 2 diabetes mellitus with other specified complication: Secondary | ICD-10-CM

## 2017-12-19 NOTE — Progress Notes (Signed)
Office: [phone]  /  Fax: [phone]   HPI:   Chief Complaint: OBESITY Jocelyn Wilson is here to discuss her progress with her obesity treatment plan. She is on the keep a food journal with 1200-1300 calories and 85 grams of protein daily and is following her eating plan approximately 50 % of the time. She states she is doing leg kicks and knee bends for 15 minutes 7 times per week. [name] has had lots of life stressors in the past 2 weeks. Daughter's dog being put down today.  Her weight is 206 lb (93.4 kg) today and has not lost weight since her last visit. She has lost 26 lbs since starting treatment with [place].  Diabetes II [name] has a diagnosis of diabetes type II. [name] states she is not feeling the benefit of Victoza, she is going to stop Victoza as she feels overwhelmed with current life stress and having to give herself injections daily and she notes cravings. She denies any hypoglycemic episodes. Last A1c was 6.5 on 09/14/17. She has been working on intensive lifestyle modifications including diet, exercise, and weight loss to help control her blood glucose levels.  Vitamin D Deficiency [name] has a diagnosis of vitamin D deficiency. She is on prescription Vit D, no refills needed today. She denies nausea, vomiting or muscle weakness.  ALLERGIES: Allergies  Allergen Reactions  . Iron Anaphylaxis    Iv iron  . Ciprofloxacin Nausea Only  . Contrast Media [Iodinated Diagnostic Agents] Itching and Rash  . Sulfa Antibiotics Itching and Rash  . Tape Rash    Adhesive tape    MEDICATIONS: Current Outpatient Medications on File Prior to Visit  Medication Sig Dispense Refill  . atorvastatin (LIPITOR) 20 MG tablet Take 20 mg by mouth daily.    . busPIRone (BUSPAR) 30 MG tablet TAKE 1 TABLET BY MOUTH 3  TIMES DAILY 270 tablet 1  . esomeprazole (NEXIUM) 40 MG capsule TAKE 1 CAPSULE BY MOUTH TWO TIMES DAILY 180 capsule 1  . ferrous sulfate 325 (65 FE) MG tablet Take 325 mg by mouth  daily.     Marland Kitchen FLOVENT HFA 220 MCG/ACT inhaler Inhale 1 puff into the lungs daily.   1  . Ibuprofen (ADVIL) 200 MG CAPS Take 2 capsules by mouth 3 (three) times daily as needed. For migraines    . Insulin Pen Needle 32G X 4 MM MISC 1 Package by Does not apply route daily at 12 noon. 100 each 0  . Interferon Beta-1b (BETASERON) 0.3 MG KIT injection INJECT 0.25 MG UNDER THE SKIN EVERY OTHER DAY AS DIRECTED 42 each 2  . levothyroxine (SYNTHROID, LEVOTHROID) 100 MCG tablet TK 1 T PO QD  0  . liraglutide (VICTOZA) 18 MG/3ML SOPN Inject 0.1 mLs (0.6 mg total) into the skin every morning. 1 pen 0  . meclizine (ANTIVERT) 12.5 MG tablet TAKE 1 TABLET(12.5 MG) BY MOUTH THREE TIMES DAILY AS NEEDED FOR DIZZINESS 270 tablet 1  . metFORMIN (GLUCOPHAGE) 500 MG tablet Take 1 tablet (500 mg total) by mouth 2 (two) times daily with a meal. 60 tablet 0  . metoprolol succinate (TOPROL-XL) 50 MG 24 hr tablet TAKE 1 TABLET BY MOUTH  DAILY 90 tablet 3  . ONE TOUCH ULTRA TEST test strip USE TO CHECK FASTING AND PRE-DINNER BLOOD GLUCOSE BID  12  . ONETOUCH DELICA LANCETS 76B MISC USE TO CHECK FASTING AND PRE-DINNER BLOOD GLUCOSE BID  12  . potassium citrate (UROCIT-K) 10 MEQ (1080 MG) SR tablet Take  10 mEq by mouth 3 (three) times daily with meals.    . ranitidine (ZANTAC) 75 MG tablet Take 75 mg by mouth at bedtime.    Marland Kitchen rOPINIRole (REQUIP) 1 MG tablet TAKE 1 TABLET BY MOUTH 4  TIMES DAILY 360 tablet 3  . sertraline (ZOLOFT) 100 MG tablet TAKE 1 AND 1/2 TABLETS BY  MOUTH DAILY 135 tablet 3  . traMADol (ULTRAM) 50 MG tablet Take 1 tablet (50 mg total) by mouth 3 (three) times daily as needed. 90 tablet 3  . traZODone (DESYREL) 100 MG tablet TAKE 1/2 TO 1 TABLET BY  MOUTH NIGHTLY FOR SLEEP 90 tablet 1  . vitamin B-12 (CYANOCOBALAMIN) 100 MCG tablet Take 100 mcg by mouth daily.    . Vitamin D, Ergocalciferol, (DRISDOL) 50000 units CAPS capsule Take 1 capsule (50,000 Units total) by mouth every 7 (seven) days. 4 capsule 0    No current facility-administered medications on file prior to visit.     PAST MEDICAL HISTORY: Past Medical History:  Diagnosis Date  . Allergy   . Anxiety   . Asthma   . Back pain   . Chronic kidney disease    KIDNEY STONES WITH STENTS  . Depression   . Diabetes ([place])   . Diverticula of colon   . Dyspnea   . Eczema   . Food allergy   . Gallbladder problem   . GERD (gastroesophageal reflux disease)   . Hypercholesterolemia   . Hyperlipidemia   . Hypothyroidism   . Joint pain   . Kidney stones   . Leg edema   . Migraine headache   . Multiple sclerosis ([place])   . Obesity   . Palpitations   . PONV (postoperative nausea and vomiting)   . Restless leg syndrome   . Right sided sciatica 10/29/2015  . Snoring   . Thyroid disease    hyperthyroidism  . Unsteady gait   . Vertigo   . Vision abnormalities     PAST SURGICAL HISTORY: Past Surgical History:  Procedure Laterality Date  . BREAST SURGERY     AUGMENTATION  . carpel tunnel    . CHOLECYSTECTOMY    . DILATION AND CURETTAGE OF UTERUS  1983  . FINGER SURGERY  x8  . HERNIA REPAIR    . LITHOTRIPSY    . NISSEN FUNDOPLICATION    . VENTRAL HERNIA REPAIR  07/25/2012   Procedure: LAPAROSCOPIC VENTRAL HERNIA;  Surgeon: Adin Hector, MD;  Location: [facility];  Service: General;  Laterality: N/A;    SOCIAL HISTORY: Social History   Tobacco Use  . Smoking status: Former Research scientist (life sciences)  . Smokeless tobacco: Never Used  Substance Use Topics  . Alcohol use: No  . Drug use: No    FAMILY HISTORY: Family History  Problem Relation Age of Onset  . Hyperlipidemia Mother   . Glaucoma Mother   . Hypertension Mother   . Anxiety disorder Mother   . Depression Mother   . Heart disease Father   . Cancer Father        prostate  . Diabetes Father   . Dementia Father   . Hypertension Father   . Hyperlipidemia Father   . Sleep apnea Father   . Obesity Father   . Cancer Brother        esophageal  . Arthritis Sister         RA    ROS: Review of Systems  Constitutional: Negative for weight loss.  Gastrointestinal: Negative for nausea  and vomiting.  Musculoskeletal:       Negative muscle weakness  Endo/Heme/Allergies:       Negative hypoglycemia    PHYSICAL EXAM: Blood pressure 112/74, pulse 98, temperature 97.7 F (36.5 C), temperature source Oral, height _0  (1.448 m), weight 206 lb (93.4 kg), SpO2 97 %. Body mass index is 44.58 kg/m. Physical Exam  Constitutional: She is oriented to person, place, and time. She appears well-developed and well-nourished.  Cardiovascular: Normal rate.  Pulmonary/Chest: Effort normal.  Musculoskeletal: Normal range of motion.  Neurological: She is oriented to person, place, and time.  Skin: Skin is warm and dry.  Psychiatric: She has a normal mood and affect. Her behavior is normal.  Vitals reviewed.   RECENT LABS AND TESTS: BMET    Component Value Date/Time   NA 142 09/14/2017 0902   K 4.6 09/14/2017 0902   CL 103 09/14/2017 0902   CO2 23 09/14/2017 0902   GLUCOSE 126 (H) 09/14/2017 0902   GLUCOSE 127 (H) 07/26/2012 0555   BUN 15 09/14/2017 0902   CREATININE 0.71 09/14/2017 0902   CALCIUM 9.1 09/14/2017 0902   GFRNONAA 89 09/14/2017 0902   GFRAA 103 09/14/2017 0902   Lab Results  Component Value Date   HGBA1C 6.5 (H) 09/14/2017   HGBA1C 7.5 (H) 05/10/2017   Lab Results  Component Value Date   INSULIN 26.4 (H) 09/14/2017   INSULIN 49.2 (H) 05/10/2017   CBC    Component Value Date/Time   WBC 6.2 05/10/2017 1138   WBC 6.6 07/26/2012 0555   RBC 4.58 05/10/2017 1138   RBC 3.77 (L) 07/26/2012 0555   HGB 12.8 05/10/2017 1138   HGB 10.9 (L) 06/11/2009 1303   HCT 40.9 05/10/2017 1138   HCT 33.6 (L) 06/11/2009 1303   PLT 232 05/10/2017 1138   MCV 89 05/10/2017 1138   MCV 78.3 (L) 06/11/2009 1303   MCH 27.9 05/10/2017 1138   MCH 29.2 07/26/2012 0555   MCHC 31.3 (L) 05/10/2017 1138   MCHC 31.3 07/26/2012 0555   RDW 14.6 05/10/2017 1138    RDW 16.3 (H) 06/11/2009 1303   LYMPHSABS 1.3 05/10/2017 1138   LYMPHSABS 1.3 06/11/2009 1303   MONOABS 0.6 07/20/2012 0848   MONOABS 0.7 06/11/2009 1303   EOSABS 0.2 05/10/2017 1138   BASOSABS 0.0 05/10/2017 1138   BASOSABS 0.0 06/11/2009 1303   Iron/TIBC/Ferritin/ %Sat    Component Value Date/Time   IRON 31 (L) 06/11/2009 1303   TIBC 420 06/11/2009 1303   FERRITIN 9 (L) 06/11/2009 1303   IRONPCTSAT 7 (L) 06/11/2009 1303   Lipid Panel     Component Value Date/Time   CHOL 162 09/14/2017 0902   TRIG 114 09/14/2017 0902   HDL 42 09/14/2017 0902   LDLCALC 97 09/14/2017 0902   Hepatic Function Panel     Component Value Date/Time   PROT 6.3 09/14/2017 0902   ALBUMIN 4.1 09/14/2017 0902   AST 16 09/14/2017 0902   ALT 17 09/14/2017 0902   ALKPHOS 80 09/14/2017 0902   BILITOT 0.3 09/14/2017 0902      Component Value Date/Time   TSH 3.430 09/14/2017 0902   TSH 2.670 05/10/2017 1138  Results for JAMEICA, COUTS (MRN [id number]) as of 12/19/2017 17:30  Ref. Range 09/14/2017 09:02  Vitamin D, 25-Hydroxy Latest Ref Range: 30.0 - 100.0 ng/mL 54.3    ASSESSMENT AND PLAN: Controlled type 2 diabetes mellitus with other specified complication, without long-term current use of insulin (HCC)  Vitamin D deficiency  Class 3 severe obesity with serious comorbidity and body mass index (BMI) of 40.0 to 44.9 in adult, unspecified obesity type ([place])  PLAN:  Diabetes II Hagar has been given extensive diabetes education by myself today including ideal fasting and post-prandial blood glucose readings, individual ideal Hgb A1c goals and hypoglycemia prevention. We discussed the importance of good blood sugar control to decrease the likelihood of diabetic complications such as nephropathy, neuropathy, limb loss, blindness, coronary artery disease, and death. We discussed the importance of intensive lifestyle modification including diet, exercise and weight loss as the first line treatment for  diabetes. Encourage BGs monitoring at least 2 times per day. We will recheck labs in 1 month and Kataleya agrees to follow up with our clinic in 2 weeks.  Vitamin D Deficiency Selma was informed that low vitamin D levels contributes to fatigue and are associated with obesity, breast, and colon cancer. Lory agrees to continue taking prescription Vit D _0 ,000 IU every week #4 and will follow up for routine testing of vitamin D, at least 2-3 times per year. She was informed of the risk of over-replacement of vitamin D and agrees to not increase her dose unless she discusses this with [place] first. Liany agrees to follow up with our clinic in 2 weeks.  We spent > than 50% of the 15 minute visit on the counseling as documented in the note.  Obesity Heavan is currently in the action stage of change. As such, her goal is to continue with weight loss efforts She has agreed to change to follow the Category 2 plan + 100 calories Carlisia has been instructed to work up to a goal of 150 minutes of combined cardio and strengthening exercise per week for weight loss and overall health benefits. We discussed the following Behavioral Modification Strategies today: increasing lean protein intake, work on meal planning and easy cooking plans, dealing with family or coworker sabotage, emotional eating strategies, and planning for success   Arilla has agreed to follow up with our clinic in 2 weeks. She was informed of the importance of frequent follow up visits to maximize her success with intensive lifestyle modifications for her multiple health conditions.   OBESITY BEHAVIORAL INTERVENTION VISIT  Today's visit was # 14 out of 22.  Starting weight: 232 lbs Starting date: 05/10/17 Today's weight : 206 lbs  Today's date: 12/19/2017 Total lbs lost to date: 62 (Patients must lose 7 lbs in the first 6 months to continue with counseling)   ASK: We discussed the diagnosis of obesity with Rosamaria Lints today and  Dajanee agreed to give [place] permission to discuss obesity behavioral modification therapy today.  ASSESS: Jonni has the diagnosis of obesity and her BMI today is 44.57 Sherrine is in the action stage of change   ADVISE: Husna was educated on the multiple health risks of obesity as well as the benefit of weight loss to improve her health. She was advised of the need for long term treatment and the importance of lifestyle modifications.  AGREE: Multiple dietary modification options and treatment options were discussed and  Jamaria agreed to the above obesity treatment plan.  Wilhemena Durie, am acting as transcriptionist for Ilene Qua, MD

## 2017-12-24 ENCOUNTER — Other Ambulatory Visit: Payer: Self-pay | Admitting: Neurology

## 2017-12-30 ENCOUNTER — Encounter (INDEPENDENT_AMBULATORY_CARE_PROVIDER_SITE_OTHER): Payer: Self-pay | Admitting: Family Medicine

## 2018-01-02 ENCOUNTER — Ambulatory Visit (INDEPENDENT_AMBULATORY_CARE_PROVIDER_SITE_OTHER): Payer: Medicare Other | Admitting: Family Medicine

## 2018-01-02 ENCOUNTER — Encounter (INDEPENDENT_AMBULATORY_CARE_PROVIDER_SITE_OTHER): Payer: Self-pay

## 2018-01-04 ENCOUNTER — Other Ambulatory Visit (INDEPENDENT_AMBULATORY_CARE_PROVIDER_SITE_OTHER): Payer: Self-pay | Admitting: Family Medicine

## 2018-01-04 DIAGNOSIS — E559 Vitamin D deficiency, unspecified: Secondary | ICD-10-CM

## 2018-01-06 ENCOUNTER — Other Ambulatory Visit (INDEPENDENT_AMBULATORY_CARE_PROVIDER_SITE_OTHER): Payer: Self-pay | Admitting: Family Medicine

## 2018-01-06 DIAGNOSIS — E119 Type 2 diabetes mellitus without complications: Secondary | ICD-10-CM

## 2018-01-09 ENCOUNTER — Ambulatory Visit (INDEPENDENT_AMBULATORY_CARE_PROVIDER_SITE_OTHER): Payer: Medicare Other | Admitting: Family Medicine

## 2018-01-09 VITALS — BP 130/72 | HR 69 | Temp 98.2°F | Ht <= 58 in | Wt 206.0 lb

## 2018-01-09 DIAGNOSIS — Z6841 Body Mass Index (BMI) 40.0 and over, adult: Secondary | ICD-10-CM | POA: Diagnosis not present

## 2018-01-09 DIAGNOSIS — E119 Type 2 diabetes mellitus without complications: Secondary | ICD-10-CM

## 2018-01-09 MED ORDER — METFORMIN HCL 500 MG PO TABS: 500.0000 mg | ORAL_TABLET | Freq: Two times a day (BID) | ORAL | 0 refills | Status: DC

## 2018-01-09 MED ORDER — LIRAGLUTIDE 18 MG/3ML ~~LOC~~ SOPN: 0.6000 mg | PEN_INJECTOR | SUBCUTANEOUS | 0 refills | Status: DC

## 2018-01-09 NOTE — Progress Notes (Signed)
Office: [phone]  /  Fax: [phone]   HPI:   Chief Complaint: OBESITY Jocelyn Wilson is here to discuss her progress with her obesity treatment plan. She is on the lower carbohydrate, vegetable and lean protein rich diet plan and is following her eating plan approximately 90 % of the time. She states she is exercising 0 minutes 0 times per week. Jocelyn Wilson has done well with maintaining her weight but deviating from her low carbohydrate plan daily. She feels she does best on low carbohydrate though.  Her weight is 206 lb (93.4 kg) today and has not lost weight since her last visit. She has lost 26 lbs since starting treatment with [place].  Diabetes II Jocelyn Wilson has a diagnosis of diabetes type II. Jocelyn Wilson stopped Victoza and fasting BGs increased from 130-140's to 160-170's. She had a friend who told her it was a "bad" medicine. She denies any hypoglycemic episodes. Last A1c was 6.5 on 09/14/17. She has been working on intensive lifestyle modifications including diet, exercise, and weight loss to help control her blood glucose levels but has not been successful in last few weeks.  ALLERGIES: Allergies  Allergen Reactions  . Iron Anaphylaxis    Iv iron  . Ciprofloxacin Nausea Only  . Contrast Media [Iodinated Diagnostic Agents] Itching and Rash  . Sulfa Antibiotics Itching and Rash  . Tape Rash    Adhesive tape    MEDICATIONS: Current Outpatient Medications on File Prior to Visit  Medication Sig Dispense Refill  . atorvastatin (LIPITOR) 20 MG tablet Take 20 mg by mouth daily.    . busPIRone (BUSPAR) 30 MG tablet TAKE 1 TABLET BY MOUTH 3  TIMES DAILY 270 tablet 1  . esomeprazole (NEXIUM) 40 MG capsule TAKE 1 CAPSULE BY MOUTH TWO TIMES DAILY 180 capsule 1  . ferrous sulfate 325 (65 FE) MG tablet Take 325 mg by mouth daily.     . Ibuprofen (ADVIL) 200 MG CAPS Take 2 capsules by mouth 3 (three) times daily as needed. For migraines    . Interferon Beta-1b (BETASERON) 0.3 MG KIT injection INJECT 0.25  MG UNDER THE SKIN EVERY OTHER DAY AS DIRECTED 42 each 2  . levothyroxine (SYNTHROID, LEVOTHROID) 100 MCG tablet TK 1 T PO QD  0  . meclizine (ANTIVERT) 12.5 MG tablet TAKE 1 TABLET(12.5 MG) BY MOUTH THREE TIMES DAILY AS NEEDED FOR DIZZINESS 270 tablet 1  . metFORMIN (GLUCOPHAGE) 500 MG tablet Take 1 tablet (500 mg total) by mouth 2 (two) times daily with a meal. 60 tablet 0  . metoprolol succinate (TOPROL-XL) 50 MG 24 hr tablet TAKE 1 TABLET BY MOUTH  DAILY 90 tablet 3  . ONE TOUCH ULTRA TEST test strip USE TO CHECK FASTING AND PRE-DINNER BLOOD GLUCOSE BID  12  . ONETOUCH DELICA LANCETS 46F MISC USE TO CHECK FASTING AND PRE-DINNER BLOOD GLUCOSE BID  12  . potassium citrate (UROCIT-K) 10 MEQ (1080 MG) SR tablet Take 10 mEq by mouth 3 (three) times daily with meals.    . ranitidine (ZANTAC) 75 MG tablet Take 75 mg by mouth at bedtime.    Marland Kitchen rOPINIRole (REQUIP) 1 MG tablet TAKE 1 TABLET BY MOUTH 4  TIMES DAILY 360 tablet 3  . sertraline (ZOLOFT) 100 MG tablet TAKE 1 AND 1/2 TABLETS BY  MOUTH DAILY 135 tablet 1  . traMADol (ULTRAM) 50 MG tablet Take 1 tablet (50 mg total) by mouth 3 (three) times daily as needed. 90 tablet 3  . traZODone (DESYREL) 100 MG tablet  TAKE 1/2 TO 1 TABLET BY  MOUTH NIGHTLY FOR SLEEP 90 tablet 1  . vitamin B-12 (CYANOCOBALAMIN) 100 MCG tablet Take 100 mcg by mouth daily.    . Vitamin D, Ergocalciferol, (DRISDOL) 50000 units CAPS capsule Take 1 capsule (50,000 Units total) by mouth every 7 (seven) days. 4 capsule 0   No current facility-administered medications on file prior to visit.     PAST MEDICAL HISTORY: Past Medical History:  Diagnosis Date  . Allergy   . Anxiety   . Asthma   . Back pain   . Chronic kidney disease    KIDNEY STONES WITH STENTS  . Depression   . Diabetes ([place])   . Diverticula of colon   . Dyspnea   . Eczema   . Food allergy   . Gallbladder problem   . GERD (gastroesophageal reflux disease)   . Hypercholesterolemia   . Hyperlipidemia   .  Hypothyroidism   . Joint pain   . Kidney stones   . Leg edema   . Migraine headache   . Multiple sclerosis ([place])   . Obesity   . Palpitations   . PONV (postoperative nausea and vomiting)   . Restless leg syndrome   . Right sided sciatica 10/29/2015  . Snoring   . Thyroid disease    hyperthyroidism  . Unsteady gait   . Vertigo   . Vision abnormalities     PAST SURGICAL HISTORY: Past Surgical History:  Procedure Laterality Date  . BREAST SURGERY     AUGMENTATION  . carpel tunnel    . CHOLECYSTECTOMY    . DILATION AND CURETTAGE OF UTERUS  1983  . FINGER SURGERY  x8  . HERNIA REPAIR    . LITHOTRIPSY    . NISSEN FUNDOPLICATION    . VENTRAL HERNIA REPAIR  07/25/2012   Procedure: LAPAROSCOPIC VENTRAL HERNIA;  Surgeon: Adin Hector, MD;  Location: [facility];  Service: General;  Laterality: N/A;    SOCIAL HISTORY: Social History   Tobacco Use  . Smoking status: Former Research scientist (life sciences)  . Smokeless tobacco: Never Used  Substance Use Topics  . Alcohol use: No  . Drug use: No    FAMILY HISTORY: Family History  Problem Relation Age of Onset  . Hyperlipidemia Mother   . Glaucoma Mother   . Hypertension Mother   . Anxiety disorder Mother   . Depression Mother   . Heart disease Father   . Cancer Father        prostate  . Diabetes Father   . Dementia Father   . Hypertension Father   . Hyperlipidemia Father   . Sleep apnea Father   . Obesity Father   . Cancer Brother        esophageal  . Arthritis Sister        RA    ROS: Review of Systems  Constitutional: Negative for weight loss.  Endo/Heme/Allergies:       Negative hypoglycemia    PHYSICAL EXAM: Blood pressure 130/72, pulse 69, temperature 98.2 F (36.8 C), temperature source Oral, height '4\' 9"'  (1.448 m), weight 206 lb (93.4 kg), SpO2 97 %. Body mass index is 44.58 kg/m. Physical Exam  Constitutional: She is oriented to person, place, and time. She appears well-developed and well-nourished.  Cardiovascular:  Normal rate.  Pulmonary/Chest: Effort normal.  Musculoskeletal: Normal range of motion.  Neurological: She is oriented to person, place, and time.  Skin: Skin is warm and dry.  Psychiatric: She has a normal mood  and affect. Her behavior is normal.  Vitals reviewed.   RECENT LABS AND TESTS: BMET    Component Value Date/Time   NA 142 09/14/2017 0902   K 4.6 09/14/2017 0902   CL 103 09/14/2017 0902   CO2 23 09/14/2017 0902   GLUCOSE 126 (H) 09/14/2017 0902   GLUCOSE 127 (H) 07/26/2012 0555   BUN 15 09/14/2017 0902   CREATININE 0.71 09/14/2017 0902   CALCIUM 9.1 09/14/2017 0902   GFRNONAA 89 09/14/2017 0902   GFRAA 103 09/14/2017 0902   Lab Results  Component Value Date   HGBA1C 6.5 (H) 09/14/2017   HGBA1C 7.5 (H) 05/10/2017   Lab Results  Component Value Date   INSULIN 26.4 (H) 09/14/2017   INSULIN 49.2 (H) 05/10/2017   CBC    Component Value Date/Time   WBC 6.2 05/10/2017 1138   WBC 6.6 07/26/2012 0555   RBC 4.58 05/10/2017 1138   RBC 3.77 (L) 07/26/2012 0555   HGB 12.8 05/10/2017 1138   HGB 10.9 (L) 06/11/2009 1303   HCT 40.9 05/10/2017 1138   HCT 33.6 (L) 06/11/2009 1303   PLT 232 05/10/2017 1138   MCV 89 05/10/2017 1138   MCV 78.3 (L) 06/11/2009 1303   MCH 27.9 05/10/2017 1138   MCH 29.2 07/26/2012 0555   MCHC 31.3 (L) 05/10/2017 1138   MCHC 31.3 07/26/2012 0555   RDW 14.6 05/10/2017 1138   RDW 16.3 (H) 06/11/2009 1303   LYMPHSABS 1.3 05/10/2017 1138   LYMPHSABS 1.3 06/11/2009 1303   MONOABS 0.6 07/20/2012 0848   MONOABS 0.7 06/11/2009 1303   EOSABS 0.2 05/10/2017 1138   BASOSABS 0.0 05/10/2017 1138   BASOSABS 0.0 06/11/2009 1303   Iron/TIBC/Ferritin/ %Sat    Component Value Date/Time   IRON 31 (L) 06/11/2009 1303   TIBC 420 06/11/2009 1303   FERRITIN 9 (L) 06/11/2009 1303   IRONPCTSAT 7 (L) 06/11/2009 1303   Lipid Panel     Component Value Date/Time   CHOL 162 09/14/2017 0902   TRIG 114 09/14/2017 0902   HDL 42 09/14/2017 0902   LDLCALC  97 09/14/2017 0902   Hepatic Function Panel     Component Value Date/Time   PROT 6.3 09/14/2017 0902   ALBUMIN 4.1 09/14/2017 0902   AST 16 09/14/2017 0902   ALT 17 09/14/2017 0902   ALKPHOS 80 09/14/2017 0902   BILITOT 0.3 09/14/2017 0902      Component Value Date/Time   TSH 3.430 09/14/2017 0902   TSH 2.670 05/10/2017 1138    ASSESSMENT AND PLAN: Type 2 diabetes mellitus without complication, without long-term current use of insulin ([place]) - Plan: metFORMIN (GLUCOPHAGE) 500 MG tablet  Class 3 severe obesity with serious comorbidity and body mass index (BMI) of 40.0 to 44.9 in adult, unspecified obesity type ([place]) - Plan: liraglutide (VICTOZA) 18 MG/3ML SOPN  PLAN:  Diabetes II Jocelyn Wilson has been given extensive diabetes education by myself today including ideal fasting and post-prandial blood glucose readings, individual ideal Hgb A1c goals and hypoglycemia prevention. We discussed the importance of good blood sugar control to decrease the likelihood of diabetic complications such as nephropathy, neuropathy, limb loss, blindness, coronary artery disease, and death. We discussed the importance of intensive lifestyle modification including diet, exercise and weight loss as the first line treatment for diabetes. Morris agrees to continue Victoza 0.6 mg q AM # 1 pen with no refills and she agrees to continue taking metformin 500 mg BID #60 and we will refill for 1 month. Jocelyn Wilson  was educated on how Victoza works and likely side effects as well as the cardioprotective benefit and she agreed to restart. Jocelyn Wilson agrees to follow up with our clinic in 2 weeks.  Obesity Jocelyn Wilson is currently in the action stage of change. As such, her goal is to continue with weight loss efforts She has agreed to follow a lower carbohydrate, vegetable and lean protein rich diet plan Jocelyn Wilson has been instructed to work up to a goal of 150 minutes of combined cardio and strengthening exercise per week for weight loss  and overall health benefits. We discussed the following Behavioral Modification Strategies today: decreasing simple carbohydrates, ways to avoid night time snacking, keeping healthy foods in the home, and better snacking choices   Jocelyn Wilson has agreed to follow up with our clinic in 2 weeks. She was informed of the importance of frequent follow up visits to maximize her success with intensive lifestyle modifications for her multiple health conditions.   OBESITY BEHAVIORAL INTERVENTION VISIT  Today's visit was # 16 out of 22.  Starting weight: 232 lbs Starting date: 05/10/17 Today's weight : 206 lbs  Today's date: 01/09/2018 Total lbs lost to date: 34 (Patients must lose 7 lbs in the first 6 months to continue with counseling)   ASK: We discussed the diagnosis of obesity with Jocelyn Wilson today and Jocelyn Wilson agreed to give [place] permission to discuss obesity behavioral modification therapy today.  ASSESS: Jocelyn Wilson has the diagnosis of obesity and her BMI today is 44.57 Jocelyn Wilson is in the action stage of change   ADVISE: Jocelyn Wilson was educated on the multiple health risks of obesity as well as the benefit of weight loss to improve her health. She was advised of the need for long term treatment and the importance of lifestyle modifications.  AGREE: Multiple dietary modification options and treatment options were discussed and  Jocelyn Wilson agreed to the above obesity treatment plan.  I, Trixie Dredge, am acting as transcriptionist for Dennard Nip, MD  I have reviewed the above documentation for accuracy and completeness, and I agree with the above. -Dennard Nip, MD

## 2018-01-19 ENCOUNTER — Telehealth: Payer: Self-pay | Admitting: Neurology

## 2018-01-19 NOTE — Telephone Encounter (Signed)
Pt is going on International travel May 16th or 17th pt would like to have an injection of some kind for her back.  Pt was offered the only office visit (04-16) she said this date is too soon.  Pt is asking for a call back

## 2018-01-19 NOTE — Telephone Encounter (Signed)
LMTC./fim 

## 2018-01-23 ENCOUNTER — Other Ambulatory Visit (INDEPENDENT_AMBULATORY_CARE_PROVIDER_SITE_OTHER): Payer: Self-pay | Admitting: Family Medicine

## 2018-01-23 DIAGNOSIS — E559 Vitamin D deficiency, unspecified: Secondary | ICD-10-CM

## 2018-01-23 NOTE — Telephone Encounter (Signed)
Spoke with [name]. She is not currently having back/sciatica  pain. She was interested in having a proactive tpi prior to a trip.  I have explained that tpi's are given to treat acute pain, not give without proactively.  She verbalized understanding of same/fim

## 2018-01-24 ENCOUNTER — Ambulatory Visit (INDEPENDENT_AMBULATORY_CARE_PROVIDER_SITE_OTHER): Payer: Medicare Other | Admitting: Family Medicine

## 2018-02-04 ENCOUNTER — Other Ambulatory Visit (INDEPENDENT_AMBULATORY_CARE_PROVIDER_SITE_OTHER): Payer: Self-pay | Admitting: Family Medicine

## 2018-02-04 DIAGNOSIS — E119 Type 2 diabetes mellitus without complications: Secondary | ICD-10-CM

## 2018-02-04 DIAGNOSIS — Z6841 Body Mass Index (BMI) 40.0 and over, adult: Secondary | ICD-10-CM

## 2018-02-10 ENCOUNTER — Other Ambulatory Visit (INDEPENDENT_AMBULATORY_CARE_PROVIDER_SITE_OTHER): Payer: Self-pay | Admitting: Family Medicine

## 2018-02-10 DIAGNOSIS — E559 Vitamin D deficiency, unspecified: Secondary | ICD-10-CM

## 2018-02-10 DIAGNOSIS — E119 Type 2 diabetes mellitus without complications: Secondary | ICD-10-CM

## 2018-02-14 ENCOUNTER — Other Ambulatory Visit (INDEPENDENT_AMBULATORY_CARE_PROVIDER_SITE_OTHER): Payer: Self-pay | Admitting: Family Medicine

## 2018-02-14 ENCOUNTER — Ambulatory Visit (INDEPENDENT_AMBULATORY_CARE_PROVIDER_SITE_OTHER): Payer: Medicare Other | Admitting: Family Medicine

## 2018-02-14 VITALS — BP 140/76 | HR 77 | Temp 98.2°F | Ht <= 58 in | Wt 208.0 lb

## 2018-02-14 DIAGNOSIS — E119 Type 2 diabetes mellitus without complications: Secondary | ICD-10-CM

## 2018-02-14 DIAGNOSIS — Z6841 Body Mass Index (BMI) 40.0 and over, adult: Secondary | ICD-10-CM | POA: Diagnosis not present

## 2018-02-14 DIAGNOSIS — E7849 Other hyperlipidemia: Secondary | ICD-10-CM | POA: Diagnosis not present

## 2018-02-14 DIAGNOSIS — E559 Vitamin D deficiency, unspecified: Secondary | ICD-10-CM | POA: Diagnosis not present

## 2018-02-14 MED ORDER — VITAMIN D (ERGOCALCIFEROL) 1.25 MG (50000 UNIT) PO CAPS: 50000.0000 [IU] | ORAL_CAPSULE | ORAL | 0 refills | Status: DC

## 2018-02-14 MED ORDER — METFORMIN HCL 500 MG PO TABS: 500.0000 mg | ORAL_TABLET | Freq: Two times a day (BID) | ORAL | 0 refills | Status: DC

## 2018-02-14 MED ORDER — INSULIN PEN NEEDLE 32G X 4 MM MISC: 1.0000 | Freq: Two times a day (BID) | 0 refills | Status: DC

## 2018-02-15 LAB — COMPREHENSIVE METABOLIC PANEL
A/G RATIO: 1.8 (ref 1.2–2.2)
ALBUMIN: 4 g/dL (ref 3.6–4.8)
ALT: 18 IU/L (ref 0–32)
AST: 13 IU/L (ref 0–40)
Alkaline Phosphatase: 95 IU/L (ref 39–117)
BUN / CREAT RATIO: 25 (ref 12–28)
BUN: 16 mg/dL (ref 8–27)
CALCIUM: 8.9 mg/dL (ref 8.7–10.3)
CHLORIDE: 103 mmol/L (ref 96–106)
CO2: 24 mmol/L (ref 20–29)
Creatinine, Ser: 0.65 mg/dL (ref 0.57–1.00)
GFR, EST AFRICAN AMERICAN: 107 mL/min/{1.73_m2} (ref >59)
GFR, EST NON AFRICAN AMERICAN: 93 mL/min/{1.73_m2} (ref >59)
Globulin, Total: 2.2 g/dL (ref 1.5–4.5)
Glucose: 160 mg/dL — ABNORMAL HIGH (ref 65–99)
Potassium: 4.2 mmol/L (ref 3.5–5.2)
Sodium: 142 mmol/L (ref 134–144)
TOTAL PROTEIN: 6.2 g/dL (ref 6.0–8.5)

## 2018-02-15 LAB — LIPID PANEL WITH LDL/HDL RATIO
Cholesterol, Total: 179 mg/dL (ref 100–199)
HDL: 51 mg/dL (ref >39)
LDL CALC: 109 mg/dL — AB (ref 0–99)
LDL/HDL RATIO: 2.1 ratio (ref 0.0–3.2)
Triglycerides: 95 mg/dL (ref 0–149)
VLDL CHOLESTEROL CAL: 19 mg/dL (ref 5–40)

## 2018-02-15 LAB — HEMOGLOBIN A1C
Est. average glucose Bld gHb Est-mCnc: 137 mg/dL
Hgb A1c MFr Bld: 6.4 % — ABNORMAL HIGH (ref 4.8–5.6)

## 2018-02-15 LAB — VITAMIN D 25 HYDROXY (VIT D DEFICIENCY, FRACTURES): Vit D, 25-Hydroxy: 46.9 ng/mL (ref 30.0–100.0)

## 2018-02-15 LAB — INSULIN, RANDOM: INSULIN: 37 u[IU]/mL — AB (ref 2.6–24.9)

## 2018-02-15 NOTE — Progress Notes (Signed)
Office: [phone]  /  Fax: [phone]   HPI:   Chief Complaint: OBESITY Jocelyn Wilson is here to discuss her progress with her obesity treatment plan. She is on the portion control better and make smarter food choices, such as increase vegetables and decrease simple carbohydrates  and is following her eating plan approximately 0 % of the time. She states she is walking for 5 to 10 minutes 2 to 3 times per week. Jocelyn Wilson is getting ready to go on a [place]+ [place] and wasn't concentrateing on weight loss. She wants to enjoy her food, but minimize weight gain on vacation. Her weight is 208 lb (94.3 kg) today and has had a weight gain of 2 pounds over a period of 4 weeks since her last visit. She has gained 2 lbs since starting treatment with [place].  Vitamin D deficiency [name] has a diagnosis of vitamin D deficiency. She is stable on vit D and denies nausea, vomiting or muscle weakness.  Diabetes II [name] has a diagnosis of diabetes type II and is stable on medications. [name] does not have blood sugar log today. Blood sugars had been improving with diet and exercise. [name] denies any hypoglycemic episodes. Last A1c was at 6.5 She has been working on intensive lifestyle modifications including diet, exercise, and weight loss to help control her blood glucose levels.  Hyperlipidemia [name] has hyperlipidemia and is on lipitor. She has been trying to improve her cholesterol levels with intensive lifestyle modification including a low saturated fat diet, exercise and weight loss. She denies any chest pain or myalgias. [name] is due for labs.  ALLERGIES: Allergies  Allergen Reactions  . Iron Anaphylaxis    Iv iron  . Ciprofloxacin Nausea Only  . Contrast Media [Iodinated Diagnostic Agents] Itching and Rash  . Sulfa Antibiotics Itching and Rash  . Tape Rash    Adhesive tape    MEDICATIONS: Current Outpatient Medications on File Prior to Visit  Medication Sig Dispense Refill  .  atorvastatin (LIPITOR) 20 MG tablet Take 20 mg by mouth daily.    . busPIRone (BUSPAR) 30 MG tablet TAKE 1 TABLET BY MOUTH 3  TIMES DAILY 270 tablet 1  . esomeprazole (NEXIUM) 40 MG capsule TAKE 1 CAPSULE BY MOUTH TWO TIMES DAILY 180 capsule 1  . ferrous sulfate 325 (65 FE) MG tablet Take 325 mg by mouth daily.     . Ibuprofen (ADVIL) 200 MG CAPS Take 2 capsules by mouth 3 (three) times daily as needed. For migraines    . Interferon Beta-1b (BETASERON) 0.3 MG KIT injection INJECT 0.25 MG UNDER THE SKIN EVERY OTHER DAY AS DIRECTED 42 each 2  . levothyroxine (SYNTHROID, LEVOTHROID) 100 MCG tablet TK 1 T PO QD  0  . meclizine (ANTIVERT) 12.5 MG tablet TAKE 1 TABLET(12.5 MG) BY MOUTH THREE TIMES DAILY AS NEEDED FOR DIZZINESS 270 tablet 1  . metoprolol succinate (TOPROL-XL) 50 MG 24 hr tablet TAKE 1 TABLET BY MOUTH  DAILY 90 tablet 3  . ONE TOUCH ULTRA TEST test strip USE TO CHECK FASTING AND PRE-DINNER BLOOD GLUCOSE BID  12  . ONETOUCH DELICA LANCETS 63W MISC USE TO CHECK FASTING AND PRE-DINNER BLOOD GLUCOSE BID  12  . potassium citrate (UROCIT-K) 10 MEQ (1080 MG) SR tablet Take 10 mEq by mouth 3 (three) times daily with meals.    . ranitidine (ZANTAC) 75 MG tablet Take 75 mg by mouth at bedtime.    Marland Kitchen rOPINIRole (REQUIP) 1 MG tablet TAKE  1 TABLET BY MOUTH 4  TIMES DAILY 360 tablet 3  . sertraline (ZOLOFT) 100 MG tablet TAKE 1 AND 1/2 TABLETS BY  MOUTH DAILY 135 tablet 1  . traMADol (ULTRAM) 50 MG tablet Take 1 tablet (50 mg total) by mouth 3 (three) times daily as needed. 90 tablet 3  . traZODone (DESYREL) 100 MG tablet TAKE 1/2 TO 1 TABLET BY  MOUTH NIGHTLY FOR SLEEP 90 tablet 1  . VICTOZA 18 MG/3ML SOPN ADMINISTER 0.6 MG UNDER THE SKIN EVERY MORNING 3 mL 0  . vitamin B-12 (CYANOCOBALAMIN) 100 MCG tablet Take 100 mcg by mouth daily.     No current facility-administered medications on file prior to visit.     PAST MEDICAL HISTORY: Past Medical History:  Diagnosis Date  . Allergy   . Anxiety     . Asthma   . Back pain   . Chronic kidney disease    KIDNEY STONES WITH STENTS  . Depression   . Diabetes ([place])   . Diverticula of colon   . Dyspnea   . Eczema   . Food allergy   . Gallbladder problem   . GERD (gastroesophageal reflux disease)   . Hypercholesterolemia   . Hyperlipidemia   . Hypothyroidism   . Joint pain   . Kidney stones   . Leg edema   . Migraine headache   . Multiple sclerosis ([place])   . Obesity   . Palpitations   . PONV (postoperative nausea and vomiting)   . Restless leg syndrome   . Right sided sciatica 10/29/2015  . Snoring   . Thyroid disease    hyperthyroidism  . Unsteady gait   . Vertigo   . Vision abnormalities     PAST SURGICAL HISTORY: Past Surgical History:  Procedure Laterality Date  . BREAST SURGERY     AUGMENTATION  . carpel tunnel    . CHOLECYSTECTOMY    . DILATION AND CURETTAGE OF UTERUS  1983  . FINGER SURGERY  x8  . HERNIA REPAIR    . LITHOTRIPSY    . NISSEN FUNDOPLICATION    . VENTRAL HERNIA REPAIR  07/25/2012   Procedure: LAPAROSCOPIC VENTRAL HERNIA;  Surgeon: Adin Hector, MD;  Location: [facility];  Service: General;  Laterality: N/A;    SOCIAL HISTORY: Social History   Tobacco Use  . Smoking status: Former Research scientist (life sciences)  . Smokeless tobacco: Never Used  Substance Use Topics  . Alcohol use: No  . Drug use: No    FAMILY HISTORY: Family History  Problem Relation Age of Onset  . Hyperlipidemia Mother   . Glaucoma Mother   . Hypertension Mother   . Anxiety disorder Mother   . Depression Mother   . Heart disease Father   . Cancer Father        prostate  . Diabetes Father   . Dementia Father   . Hypertension Father   . Hyperlipidemia Father   . Sleep apnea Father   . Obesity Father   . Cancer Brother        esophageal  . Arthritis Sister        RA    ROS: Review of Systems  Constitutional: Negative for weight loss.  Gastrointestinal: Negative for nausea and vomiting.  Musculoskeletal:       Negative for  muscle weakness    PHYSICAL EXAM: Blood pressure 140/76, pulse 77, temperature 98.2 F (36.8 C), temperature source Oral, height _0  (1.448 m), weight 208 lb (94.3 kg), SpO2  97 %. Body mass index is 45.01 kg/m. Physical Exam  Constitutional: She is oriented to person, place, and time. She appears well-developed and well-nourished.  Cardiovascular: Normal rate.  Pulmonary/Chest: Effort normal.  Musculoskeletal: Normal range of motion.  Neurological: She is oriented to person, place, and time.  Skin: Skin is warm and dry.  Psychiatric: She has a normal mood and affect. Her behavior is normal.  Vitals reviewed.   RECENT LABS AND TESTS: BMET    Component Value Date/Time   NA 142 02/14/2018 0801   K 4.2 02/14/2018 0801   CL 103 02/14/2018 0801   CO2 24 02/14/2018 0801   GLUCOSE 160 (H) 02/14/2018 0801   GLUCOSE 127 (H) 07/26/2012 0555   BUN 16 02/14/2018 0801   CREATININE 0.65 02/14/2018 0801   CALCIUM 8.9 02/14/2018 0801   GFRNONAA 93 02/14/2018 0801   GFRAA 107 02/14/2018 0801   Lab Results  Component Value Date   HGBA1C 6.4 (H) 02/14/2018   HGBA1C 6.5 (H) 09/14/2017   HGBA1C 7.5 (H) 05/10/2017   Lab Results  Component Value Date   INSULIN 37.0 (H) 02/14/2018   INSULIN 26.4 (H) 09/14/2017   INSULIN 49.2 (H) 05/10/2017   CBC    Component Value Date/Time   WBC 6.2 05/10/2017 1138   WBC 6.6 07/26/2012 0555   RBC 4.58 05/10/2017 1138   RBC 3.77 (L) 07/26/2012 0555   HGB 12.8 05/10/2017 1138   HGB 10.9 (L) 06/11/2009 1303   HCT 40.9 05/10/2017 1138   HCT 33.6 (L) 06/11/2009 1303   PLT 232 05/10/2017 1138   MCV 89 05/10/2017 1138   MCV 78.3 (L) 06/11/2009 1303   MCH 27.9 05/10/2017 1138   MCH 29.2 07/26/2012 0555   MCHC 31.3 (L) 05/10/2017 1138   MCHC 31.3 07/26/2012 0555   RDW 14.6 05/10/2017 1138   RDW 16.3 (H) 06/11/2009 1303   LYMPHSABS 1.3 05/10/2017 1138   LYMPHSABS 1.3 06/11/2009 1303   MONOABS 0.6 07/20/2012 0848   MONOABS 0.7 06/11/2009 1303    EOSABS 0.2 05/10/2017 1138   BASOSABS 0.0 05/10/2017 1138   BASOSABS 0.0 06/11/2009 1303   Iron/TIBC/Ferritin/ %Sat    Component Value Date/Time   IRON 31 (L) 06/11/2009 1303   TIBC 420 06/11/2009 1303   FERRITIN 9 (L) 06/11/2009 1303   IRONPCTSAT 7 (L) 06/11/2009 1303   Lipid Panel     Component Value Date/Time   CHOL 179 02/14/2018 0801   TRIG 95 02/14/2018 0801   HDL 51 02/14/2018 0801   LDLCALC 109 (H) 02/14/2018 0801   Hepatic Function Panel     Component Value Date/Time   PROT 6.2 02/14/2018 0801   ALBUMIN 4.0 02/14/2018 0801   AST 13 02/14/2018 0801   ALT 18 02/14/2018 0801   ALKPHOS 95 02/14/2018 0801   BILITOT <0.2 02/14/2018 0801      Component Value Date/Time   TSH 3.430 09/14/2017 0902   TSH 2.670 05/10/2017 1138   Results for ETHERINE, MACKOWIAK (MRN [id number]) as of 02/15/2018 15:12  Ref. Range 02/14/2018 08:01  Vitamin D, 25-Hydroxy Latest Ref Range: 30.0 - 100.0 ng/mL 46.9   ASSESSMENT AND PLAN: Type 2 diabetes mellitus without complication, without long-term current use of insulin (HCC) - Plan: Comprehensive metabolic panel, Hemoglobin A1c, Insulin, random, metFORMIN (GLUCOPHAGE) 500 MG tablet, Insulin Pen Needle (BD PEN NEEDLE NANO U/F) 32G X 4 MM MISC  Vitamin D deficiency - Plan: VITAMIN D 25 Hydroxy (Vit-D Deficiency, Fractures), Vitamin D, Ergocalciferol, (DRISDOL) 50000 units CAPS  capsule  Other hyperlipidemia - Plan: Lipid Panel With LDL/HDL Ratio  Class 3 severe obesity with serious comorbidity and body mass index (BMI) of 45.0 to 49.9 in adult, unspecified obesity type ([place])  PLAN:  Vitamin D Deficiency Fronnie was informed that low vitamin D levels contributes to fatigue and are associated with obesity, breast, and colon cancer. She agrees to continue to take prescription Vit D _0 ,000 IU every week #4 with no refills. We will check labs and she will follow up for routine testing of vitamin D, at least 2-3 times per year. She was informed of the  risk of over-replacement of vitamin D and agrees to not increase her dose unless she discusses this with [place] first. Dalesha agrees to follow up with our clinic in 4 weeks.  Diabetes II Talon has been given extensive diabetes education by myself today including ideal fasting and post-prandial blood glucose readings, individual ideal Hgb A1c goals and hypoglycemia prevention. We discussed the importance of good blood sugar control to decrease the likelihood of diabetic complications such as nephropathy, neuropathy, limb loss, blindness, coronary artery disease, and death. We discussed the importance of intensive lifestyle modification including diet, exercise and weight loss as the first line treatment for diabetes. Dayla agrees to continue metformin 500 mg bid, we will refill for 1 month and will refill pen needles for 1 month. We will check labs and Patsy agreed to follow up at the agreed upon time.  Hyperlipidemia Natacha was informed of the American Heart Association Guidelines emphasizing intensive lifestyle modifications as the first line treatment for hyperlipidemia. We discussed many lifestyle modifications today in depth, and Charnay will continue to work on decreasing saturated fats such as fatty red meat, butter and many fried foods. She will also increase vegetables and lean protein in her diet and continue to work on exercise and weight loss efforts. We will check labs and Kadince will continue lipitor as prescribed.  Obesity Elynn is currently in the action stage of change. As such, her goal is to continue with weight loss efforts She has agreed to portion control better and make smarter food choices, such as increase vegetables and decrease simple carbohydrates  Amela has been instructed to work up to a goal of 150 minutes of combined cardio and strengthening exercise per week for weight loss and overall health benefits. We discussed the following Behavioral Modification Strategies today:  travel eating strategies and celebration eating strategies  Kari has agreed to follow up with our clinic in 4 weeks. She was informed of the importance of frequent follow up visits to maximize her success with intensive lifestyle modifications for her multiple health conditions.   OBESITY BEHAVIORAL INTERVENTION VISIT  Today's visit was # 17 out of 22.  Starting weight: 232 lbs Starting date: 05/10/17 Today's weight : 208 lbs  Today's date: 02/14/2018 Total lbs lost to date: 24 (Patients must lose 7 lbs in the first 6 months to continue with counseling)   ASK: We discussed the diagnosis of obesity with Rosamaria Lints today and Aybree agreed to give [place] permission to discuss obesity behavioral modification therapy today.  ASSESS: Chrysta has the diagnosis of obesity and her BMI today is 4 Ebelyn is in the action stage of change   ADVISE: Rivky was educated on the multiple health risks of obesity as well as the benefit of weight loss to improve her health. She was advised of the need for long term treatment and the importance of lifestyle modifications.  AGREE:  Multiple dietary modification options and treatment options were discussed and  Rhodie agreed to the above obesity treatment plan.  I, Doreene Nest, am acting as transcriptionist for Dennard Nip, MD  I have reviewed the above documentation for accuracy and completeness, and I agree with the above. -Dennard Nip, MD

## 2018-03-15 ENCOUNTER — Ambulatory Visit (INDEPENDENT_AMBULATORY_CARE_PROVIDER_SITE_OTHER): Payer: Medicare Other | Admitting: Family Medicine

## 2018-03-15 VITALS — BP 135/80 | HR 94 | Temp 98.3°F | Ht <= 58 in | Wt 209.0 lb

## 2018-03-15 DIAGNOSIS — E559 Vitamin D deficiency, unspecified: Secondary | ICD-10-CM

## 2018-03-15 DIAGNOSIS — E119 Type 2 diabetes mellitus without complications: Secondary | ICD-10-CM | POA: Diagnosis not present

## 2018-03-15 DIAGNOSIS — Z6841 Body Mass Index (BMI) 40.0 and over, adult: Secondary | ICD-10-CM

## 2018-03-15 MED ORDER — METFORMIN HCL 500 MG PO TABS: 500.0000 mg | ORAL_TABLET | Freq: Two times a day (BID) | ORAL | 0 refills | Status: DC

## 2018-03-15 MED ORDER — LIRAGLUTIDE 18 MG/3ML ~~LOC~~ SOPN: PEN_INJECTOR | SUBCUTANEOUS | 0 refills | Status: DC

## 2018-03-15 MED ORDER — VITAMIN D (ERGOCALCIFEROL) 1.25 MG (50000 UNIT) PO CAPS
50000.0000 [IU] | ORAL_CAPSULE | ORAL | 0 refills | Status: DC
Start: 2018-03-15 — End: 2018-10-19

## 2018-03-15 NOTE — Progress Notes (Signed)
Office: [phone]  /  Fax: [phone]   HPI:   Chief Complaint: OBESITY Kerriann is here to discuss her progress with her obesity treatment plan. She is on the portion control better and make smarter food choices plan and is following her eating plan approximately 0 % of the time. She states she was walking more with the river cruise. Brandace was on vacation on a 2 week river cruise and did increased walking. She did well mostly maintaining her weight, even with some celebration eating. She is ready to get back on track now. Her weight is 209 lb (94.8 kg) today and has had a weight gain of 1 pound over a period of 4 weeks since her last visit. She has lost 23 lbs since starting treatment with [place].  Diabetes II Jesika has a diagnosis of diabetes type II. She is stable on medications. Indiya didn't check her blood sugar on vacation and denies feeling hypoglycemic. Last A1c was at 6.4 She has been working on intensive lifestyle modifications including diet, exercise, and weight loss to help control her blood glucose levels.  Vitamin D deficiency Crystel has a diagnosis of vitamin D deficiency. She is stable on vit D, but is not yet at goal. [place] denies nausea, vomiting or muscle weakness.  ALLERGIES: Allergies  Allergen Reactions  . Iron Anaphylaxis    Iv iron  . Ciprofloxacin Nausea Only  . Contrast Media [Iodinated Diagnostic Agents] Itching and Rash  . Sulfa Antibiotics Itching and Rash  . Tape Rash    Adhesive tape    MEDICATIONS: Current Outpatient Medications on File Prior to Visit  Medication Sig Dispense Refill  . atorvastatin (LIPITOR) 20 MG tablet Take 20 mg by mouth daily.    . busPIRone (BUSPAR) 30 MG tablet TAKE 1 TABLET BY MOUTH 3  TIMES DAILY 270 tablet 1  . esomeprazole (NEXIUM) 40 MG capsule TAKE 1 CAPSULE BY MOUTH TWO TIMES DAILY 180 capsule 1  . ferrous sulfate 325 (65 FE) MG tablet Take 325 mg by mouth daily.     . Ibuprofen (ADVIL) 200 MG CAPS Take 2  capsules by mouth 3 (three) times daily as needed. For migraines    . Insulin Pen Needle (BD PEN NEEDLE NANO U/F) 32G X 4 MM MISC 1 Package by Does not apply route 2 (two) times daily. 100 each 0  . Interferon Beta-1b (BETASERON) 0.3 MG KIT injection INJECT 0.25 MG UNDER THE SKIN EVERY OTHER DAY AS DIRECTED 42 each 2  . levothyroxine (SYNTHROID, LEVOTHROID) 100 MCG tablet TK 1 T PO QD  0  . meclizine (ANTIVERT) 12.5 MG tablet TAKE 1 TABLET(12.5 MG) BY MOUTH THREE TIMES DAILY AS NEEDED FOR DIZZINESS 270 tablet 1  . metoprolol succinate (TOPROL-XL) 50 MG 24 hr tablet TAKE 1 TABLET BY MOUTH  DAILY 90 tablet 3  . ONE TOUCH ULTRA TEST test strip USE TO CHECK FASTING AND PRE-DINNER BLOOD GLUCOSE BID  12  . ONETOUCH DELICA LANCETS 83T MISC USE TO CHECK FASTING AND PRE-DINNER BLOOD GLUCOSE BID  12  . potassium citrate (UROCIT-K) 10 MEQ (1080 MG) SR tablet Take 10 mEq by mouth 3 (three) times daily with meals.    . ranitidine (ZANTAC) 75 MG tablet Take 75 mg by mouth at bedtime.    Marland Kitchen rOPINIRole (REQUIP) 1 MG tablet TAKE 1 TABLET BY MOUTH 4  TIMES DAILY 360 tablet 3  . sertraline (ZOLOFT) 100 MG tablet TAKE 1 AND 1/2 TABLETS BY  MOUTH DAILY 135 tablet  1  . traMADol (ULTRAM) 50 MG tablet Take 1 tablet (50 mg total) by mouth 3 (three) times daily as needed. 90 tablet 3  . traZODone (DESYREL) 100 MG tablet TAKE 1/2 TO 1 TABLET BY  MOUTH NIGHTLY FOR SLEEP 90 tablet 1  . VICTOZA 18 MG/3ML SOPN ADMINISTER 0.6 MG UNDER THE SKIN EVERY MORNING 3 mL 0  . vitamin B-12 (CYANOCOBALAMIN) 100 MCG tablet Take 100 mcg by mouth daily.     No current facility-administered medications on file prior to visit.     PAST MEDICAL HISTORY: Past Medical History:  Diagnosis Date  . Allergy   . Anxiety   . Asthma   . Back pain   . Chronic kidney disease    KIDNEY STONES WITH STENTS  . Depression   . Diabetes ([place])   . Diverticula of colon   . Dyspnea   . Eczema   . Food allergy   . Gallbladder problem   . GERD  (gastroesophageal reflux disease)   . Hypercholesterolemia   . Hyperlipidemia   . Hypothyroidism   . Joint pain   . Kidney stones   . Leg edema   . Migraine headache   . Multiple sclerosis ([place])   . Obesity   . Palpitations   . PONV (postoperative nausea and vomiting)   . Restless leg syndrome   . Right sided sciatica 10/29/2015  . Snoring   . Thyroid disease    hyperthyroidism  . Unsteady gait   . Vertigo   . Vision abnormalities     PAST SURGICAL HISTORY: Past Surgical History:  Procedure Laterality Date  . BREAST SURGERY     AUGMENTATION  . carpel tunnel    . CHOLECYSTECTOMY    . DILATION AND CURETTAGE OF UTERUS  1983  . FINGER SURGERY  x8  . HERNIA REPAIR    . LITHOTRIPSY    . NISSEN FUNDOPLICATION    . VENTRAL HERNIA REPAIR  07/25/2012   Procedure: LAPAROSCOPIC VENTRAL HERNIA;  Surgeon: Adin Hector, MD;  Location: [facility];  Service: General;  Laterality: N/A;    SOCIAL HISTORY: Social History   Tobacco Use  . Smoking status: Former Research scientist (life sciences)  . Smokeless tobacco: Never Used  Substance Use Topics  . Alcohol use: No  . Drug use: No    FAMILY HISTORY: Family History  Problem Relation Age of Onset  . Hyperlipidemia Mother   . Glaucoma Mother   . Hypertension Mother   . Anxiety disorder Mother   . Depression Mother   . Heart disease Father   . Cancer Father        prostate  . Diabetes Father   . Dementia Father   . Hypertension Father   . Hyperlipidemia Father   . Sleep apnea Father   . Obesity Father   . Cancer Brother        esophageal  . Arthritis Sister        RA    ROS: Review of Systems  Constitutional: Negative for weight loss.  Gastrointestinal: Negative for nausea and vomiting.  Musculoskeletal:       Negative for muscle weakness  Endo/Heme/Allergies:       Negative for hypoglycemia    PHYSICAL EXAM: Blood pressure 135/80, pulse 94, temperature 98.3 F (36.8 C), temperature source Oral, height '4\' 9"'  (1.448 m), weight 209 lb  (94.8 kg), SpO2 98 %. Body mass index is 45.23 kg/m. Physical Exam  Constitutional: She is oriented to person, place, and  time. She appears well-developed and well-nourished.  Cardiovascular: Normal rate.  Pulmonary/Chest: Effort normal.  Musculoskeletal: Normal range of motion.  Neurological: She is oriented to person, place, and time.  Skin: Skin is warm and dry.  Psychiatric: She has a normal mood and affect. Her behavior is normal.  Vitals reviewed.   RECENT LABS AND TESTS: BMET    Component Value Date/Time   NA 142 02/14/2018 0801   K 4.2 02/14/2018 0801   CL 103 02/14/2018 0801   CO2 24 02/14/2018 0801   GLUCOSE 160 (H) 02/14/2018 0801   GLUCOSE 127 (H) 07/26/2012 0555   BUN 16 02/14/2018 0801   CREATININE 0.65 02/14/2018 0801   CALCIUM 8.9 02/14/2018 0801   GFRNONAA 93 02/14/2018 0801   GFRAA 107 02/14/2018 0801   Lab Results  Component Value Date   HGBA1C 6.4 (H) 02/14/2018   HGBA1C 6.5 (H) 09/14/2017   HGBA1C 7.5 (H) 05/10/2017   Lab Results  Component Value Date   INSULIN 37.0 (H) 02/14/2018   INSULIN 26.4 (H) 09/14/2017   INSULIN 49.2 (H) 05/10/2017   CBC    Component Value Date/Time   WBC 6.2 05/10/2017 1138   WBC 6.6 07/26/2012 0555   RBC 4.58 05/10/2017 1138   RBC 3.77 (L) 07/26/2012 0555   HGB 12.8 05/10/2017 1138   HGB 10.9 (L) 06/11/2009 1303   HCT 40.9 05/10/2017 1138   HCT 33.6 (L) 06/11/2009 1303   PLT 232 05/10/2017 1138   MCV 89 05/10/2017 1138   MCV 78.3 (L) 06/11/2009 1303   MCH 27.9 05/10/2017 1138   MCH 29.2 07/26/2012 0555   MCHC 31.3 (L) 05/10/2017 1138   MCHC 31.3 07/26/2012 0555   RDW 14.6 05/10/2017 1138   RDW 16.3 (H) 06/11/2009 1303   LYMPHSABS 1.3 05/10/2017 1138   LYMPHSABS 1.3 06/11/2009 1303   MONOABS 0.6 07/20/2012 0848   MONOABS 0.7 06/11/2009 1303   EOSABS 0.2 05/10/2017 1138   BASOSABS 0.0 05/10/2017 1138   BASOSABS 0.0 06/11/2009 1303   Iron/TIBC/Ferritin/ %Sat    Component Value Date/Time   IRON 31 (L)  06/11/2009 1303   TIBC 420 06/11/2009 1303   FERRITIN 9 (L) 06/11/2009 1303   IRONPCTSAT 7 (L) 06/11/2009 1303   Lipid Panel     Component Value Date/Time   CHOL 179 02/14/2018 0801   TRIG 95 02/14/2018 0801   HDL 51 02/14/2018 0801   LDLCALC 109 (H) 02/14/2018 0801   Hepatic Function Panel     Component Value Date/Time   PROT 6.2 02/14/2018 0801   ALBUMIN 4.0 02/14/2018 0801   AST 13 02/14/2018 0801   ALT 18 02/14/2018 0801   ALKPHOS 95 02/14/2018 0801   BILITOT <0.2 02/14/2018 0801      Component Value Date/Time   TSH 3.430 09/14/2017 0902   TSH 2.670 05/10/2017 1138   Results for IARA, MONDS (MRN [id number]) as of 03/15/2018 18:05  Ref. Range 02/14/2018 08:01  Vitamin D, 25-Hydroxy Latest Ref Range: 30.0 - 100.0 ng/mL 46.9   ASSESSMENT AND PLAN: Type 2 diabetes mellitus without complication, without long-term current use of insulin (HCC) - Plan: metFORMIN (GLUCOPHAGE) 500 MG tablet, liraglutide (VICTOZA) 18 MG/3ML SOPN  Vitamin D deficiency - Plan: Vitamin D, Ergocalciferol, (DRISDOL) 50000 units CAPS capsule  Class 3 severe obesity with serious comorbidity and body mass index (BMI) of 45.0 to 49.9 in adult, unspecified obesity type ([place])  PLAN:  Diabetes II Joannah has been given extensive diabetes education by myself today including ideal  fasting and post-prandial blood glucose readings, individual ideal Hgb A1c goals and hypoglycemia prevention. We discussed the importance of good blood sugar control to decrease the likelihood of diabetic complications such as nephropathy, neuropathy, limb loss, blindness, coronary artery disease, and death. We discussed the importance of intensive lifestyle modification including diet, exercise and weight loss as the first line treatment for diabetes. Lakasha agrees to continue metformin 500 mg 2 times daily #60 with no refills and victoza  0.6 mg every morning, we will refill for 1 month. Tyshauna agreed to follow up as  directed.  Vitamin D Deficiency Zykera was informed that low vitamin D levels contributes to fatigue and are associated with obesity, breast, and colon cancer. She agrees to continue to take prescription Vit D '@50' ,000 IU every week #4 with no refills and will follow up for routine testing of vitamin D, at least 2-3 times per year. She was informed of the risk of over-replacement of vitamin D and agrees to not increase her dose unless she discusses this with [place] first. Steele agrees to follow up as directed.  Obesity Daphnie is currently in the action stage of change. As such, her goal is to continue with weight loss efforts She has agreed to follow a lower carbohydrate, vegetable and lean protein rich diet plan Ieshia has been instructed to work up to a goal of 150 minutes of combined cardio and strengthening exercise per week for weight loss and overall health benefits. We discussed the following Behavioral Modification Strategies today: increasing lean protein intake, decreasing simple carbohydrates  and work on meal planning and easy cooking plans  Kaylena has agreed to follow up with our clinic in 3 weeks. She was informed of the importance of frequent follow up visits to maximize her success with intensive lifestyle modifications for her multiple health conditions.   OBESITY BEHAVIORAL INTERVENTION VISIT  Today's visit was # 18 out of 22.  Starting weight: 232 lbs Starting date: 05/10/17 Today's weight : 209 lbs Today's date: 03/15/2018 Total lbs lost to date: 23 (Patients must lose 7 lbs in the first 6 months to continue with counseling)   ASK: We discussed the diagnosis of obesity with Rosamaria Lints today and Marquita agreed to give [place] permission to discuss obesity behavioral modification therapy today.  ASSESS: Tiyana has the diagnosis of obesity and her BMI today is 45.21 Tephanie is in the action stage of change   ADVISE: Pablo was educated on the multiple health risks of obesity  as well as the benefit of weight loss to improve her health. She was advised of the need for long term treatment and the importance of lifestyle modifications.  AGREE: Multiple dietary modification options and treatment options were discussed and  Monasia agreed to the above obesity treatment plan.  I, Doreene Nest, am acting as transcriptionist for Dennard Nip, MD  I have reviewed the above documentation for accuracy and completeness, and I agree with the above. -Dennard Nip, MD

## 2018-03-31 ENCOUNTER — Other Ambulatory Visit: Payer: Self-pay | Admitting: Neurology

## 2018-04-03 ENCOUNTER — Telehealth (INDEPENDENT_AMBULATORY_CARE_PROVIDER_SITE_OTHER): Payer: Self-pay | Admitting: Family Medicine

## 2018-04-03 NOTE — Telephone Encounter (Signed)
Patient called, cancelled her appointment for 04/04/18 with Dr. Leafy Ro.  She was informed that there would be a $25 no show fee applied and it would need to be paid prior to rescheduling.  She was adamant that she was not going to pay the $25.  I transferred the call to Marcie Bal and Marcie Bal confirmed for me to applied the no show fee.

## 2018-04-04 ENCOUNTER — Encounter (INDEPENDENT_AMBULATORY_CARE_PROVIDER_SITE_OTHER): Payer: Self-pay

## 2018-04-04 ENCOUNTER — Ambulatory Visit (INDEPENDENT_AMBULATORY_CARE_PROVIDER_SITE_OTHER): Payer: Medicare Other | Admitting: Family Medicine

## 2018-04-12 ENCOUNTER — Telehealth (INDEPENDENT_AMBULATORY_CARE_PROVIDER_SITE_OTHER): Payer: Self-pay | Admitting: Family Medicine

## 2018-04-12 ENCOUNTER — Encounter (INDEPENDENT_AMBULATORY_CARE_PROVIDER_SITE_OTHER): Payer: Self-pay

## 2018-04-12 NOTE — Telephone Encounter (Signed)
Patient has requested a refill of her Metformin and Vit D.  Her pharmacy is [facility] on [address]. Thank you Maudie Mercury

## 2018-04-12 NOTE — Telephone Encounter (Signed)
Mychart sent to patient.

## 2018-04-18 ENCOUNTER — Encounter: Payer: Self-pay | Admitting: Neurology

## 2018-04-18 ENCOUNTER — Ambulatory Visit: Payer: Medicare Other | Admitting: Neurology

## 2018-04-18 VITALS — BP 137/73 | HR 87 | Resp 20 | Ht <= 58 in | Wt 216.0 lb

## 2018-04-18 DIAGNOSIS — E669 Obesity, unspecified: Secondary | ICD-10-CM | POA: Diagnosis not present

## 2018-04-18 DIAGNOSIS — F32A Depression, unspecified: Secondary | ICD-10-CM

## 2018-04-18 DIAGNOSIS — R2689 Other abnormalities of gait and mobility: Secondary | ICD-10-CM | POA: Diagnosis not present

## 2018-04-18 DIAGNOSIS — G35 Multiple sclerosis: Secondary | ICD-10-CM | POA: Diagnosis not present

## 2018-04-18 DIAGNOSIS — F411 Generalized anxiety disorder: Secondary | ICD-10-CM

## 2018-04-18 DIAGNOSIS — M47817 Spondylosis without myelopathy or radiculopathy, lumbosacral region: Secondary | ICD-10-CM | POA: Diagnosis not present

## 2018-04-18 DIAGNOSIS — F329 Major depressive disorder, single episode, unspecified: Secondary | ICD-10-CM

## 2018-04-18 MED ORDER — TRAMADOL HCL 50 MG PO TABS: 50.0000 mg | ORAL_TABLET | Freq: Three times a day (TID) | ORAL | 1 refills | Status: DC | PRN

## 2018-04-18 NOTE — Progress Notes (Signed)
[facility]  PATIENT: Jocelyn Wilson DOB: 25-May-1951  REFERRING DOCTOR OR PCP:  Adaku Nnodi SOURCE: Patient  _________________________________   HISTORICAL  CHIEF COMPLAINT:  Chief Complaint  Patient presents with  . Multiple Sclerosis    Continues to have trouble affording Betaseron but is reluctant to switch meds or stop it, so is taking it 2-4 times per wk. Believes blance and depression are worse. No SI/HI.H[place]    HISTORY OF PRESENT ILLNESS:  Jocelyn Wilson is a 67 year old woman with multiple sclerosis, back pain (facet syndrome), and migraines.  Update 04/18/2018: She is noting more difficulty with her balance.   Her ankle gives out some and she has a couple falls breaking her foot.    Mri brain 10/2017 showed no new lesions.     Her last RFA ablation in 2017 did not help.   The first one she had with me helped but a second one did not help much.     Update 10/19/2017: She feels her MS remains stable on Betaseron. She tolerates it well and has not had any exacerbations in many years.    Her last MRI of the brain was 11/12/2015 and it shows foci in the brainstem and hemispheres consistent with MS. There was no change with compared to an MRI from 2016. Unfortunately she is having difficulty with her co-pay and she is not eligible for patient assistance.      We had a long discussion about her options (see plan) for a DMT.  She notes that her gait is stiff when she begins to walk after sitting for a while. She denies any change in strength and sensation. She notes no change in bladder function. Vision is fine.    She sleeps well with her current medications. She has restless leg syndrome that is helped by the medications. She still notes some depression.   Headaches are doing well  Back pain is doing about the same as last visit and better than 2 years ago.   She received a lot of benefit from radiofrequency ablation of the medial branch nerves   From  06/16/2016: Her LBP is severe at times.   Pain is mostly in the right > left buttock, and across the lower back.   Pain sometimes will radiate to the proximal leg but never down towards the foot    MRI of the lumbar spine shows severe degenerative disc disease and facet hypertrophy at L4-L5.   She has had radiofrequency ablations of the medial branch nerves with benefit. She also has had ESI's, SI joint injections, piriformis muscles injections with less benefit.   She had RFA of the medial branch nerves 08/2014 with some benefit and got much more benefit after also doing PT 11/15 - 1/16.     She felt better for one month after a bilateral piriformis trigger point injection at the last visit.  Second shot did not help as much.    She will be having RFA by Dr. Jola Baptist soon.    MS:   Her MS has been stable. She denies any recent exacerbations. She continues on Betaseron and tolerates it well. MRI 2017 has shown periventricular white matter foci consistent with multiple sclerosis. When compared to prior MRI dated 08/25/2009, there were no new lesions. The older scan also showed a small left medullary focus that was not observed on the more recent study.      She is on Betaseron.   She denies any new  changes with gait, strength or sensation. She denies any significant vision or bladder problems.    Dysesthesias:   She has painful dysesthesia in her shins and feet at times.     Sleep:   She has RLS day and night but worse at night.   Ropinirole greatly helps RLS and she now takes qid.    She gets slightly sleepy at times.     Mood:  She has a chronic mild depression and often feels sad with occasional crying. She takes sertraline, BuSpar and alprazolam. She feels mood is better this visit than it was last visit.     Migraine headaches:  This is generally doing well.    MS History:   Her MS was diagnosed around 2004.    She was actually seeing me for migraine headache when she presented with optic neuritis and  vertigo and had another MRI.   She had interval development of multiple new foci and underwent a lumbar puncture.  CSF was also consistent with MS.   She was placed on Betaseron and continues on that medication with good control of the MS. No definite exacerbations.    REVIEW OF SYSTEMS: Constitutional: No fevers, chills, sweats, or change in appetite Eyes: No visual changes, double vision, eye pain Ear, nose and throat: No hearing loss, ear pain, nasal congestion, sore throat Cardiovascular: No chest pain, palpitations Respiratory: No shortness of breath at rest or with exertion.   No wheezes GastrointestinaI: No nausea, vomiting, diarrhea, abdominal pain, fecal incontinence Genitourinary: No dysuria, urinary retention or frequency.  No nocturia. Musculoskeletal: No neck pain, back pain Integumentary: No rash, pruritus, skin lesions Neurological: as above Psychiatric: No depression at this time.  No anxiety Endocrine: No palpitations, diaphoresis, change in appetite, change in weigh or increased thirst Hematologic/Lymphatic: No anemia, purpura, petechiae. Allergic/Immunologic: No itchy/runny eyes, nasal congestion, recent allergic reactions, rashes  ALLERGIES: Allergies  Allergen Reactions  . Iron Anaphylaxis    Iv iron  . Ciprofloxacin Nausea Only  . Contrast Media [Iodinated Diagnostic Agents] Itching and Rash  . Sulfa Antibiotics Itching and Rash  . Tape Rash    Adhesive tape    HOME MEDICATIONS:  Current Outpatient Medications:  .  atorvastatin (LIPITOR) 20 MG tablet, Take 20 mg by mouth daily., Disp: , Rfl:  .  busPIRone (BUSPAR) 30 MG tablet, TAKE 1 TABLET BY MOUTH 3  TIMES DAILY, Disp: 270 tablet, Rfl: 1 .  esomeprazole (NEXIUM) 40 MG capsule, TAKE 1 CAPSULE BY MOUTH TWO TIMES DAILY, Disp: 180 capsule, Rfl: 1 .  ferrous sulfate 325 (65 FE) MG tablet, Take 325 mg by mouth daily. , Disp: , Rfl:  .  Ibuprofen (ADVIL) 200 MG CAPS, Take 2 capsules by mouth 3 (three) times  daily as needed. For migraines, Disp: , Rfl:  .  Insulin Pen Needle (BD PEN NEEDLE NANO U/F) 32G X 4 MM MISC, 1 Package by Does not apply route 2 (two) times daily., Disp: 100 each, Rfl: 0 .  Interferon Beta-1b (BETASERON) 0.3 MG KIT injection, INJECT 0.25 MG UNDER THE SKIN EVERY OTHER DAY AS DIRECTED, Disp: 42 each, Rfl: 2 .  levothyroxine (SYNTHROID, LEVOTHROID) 100 MCG tablet, TK 1 T PO QD, Disp: , Rfl: 0 .  liraglutide (VICTOZA) 18 MG/3ML SOPN, ADMINISTER 0.6 MG UNDER THE SKIN EVERY MORNING, Disp: 3 mL, Rfl: 0 .  meclizine (ANTIVERT) 12.5 MG tablet, TAKE 1 TABLET(12.5 MG) BY MOUTH THREE TIMES DAILY AS NEEDED FOR DIZZINESS, Disp: 270 tablet, Rfl: 1 .  metFORMIN (GLUCOPHAGE) 500 MG tablet, Take 1 tablet (500 mg total) by mouth 2 (two) times daily with a meal., Disp: 60 tablet, Rfl: 0 .  metoprolol succinate (TOPROL-XL) 50 MG 24 hr tablet, TAKE 1 TABLET BY MOUTH  DAILY, Disp: 90 tablet, Rfl: 3 .  ONE TOUCH ULTRA TEST test strip, USE TO CHECK FASTING AND PRE-DINNER BLOOD GLUCOSE BID, Disp: , Rfl: 12 .  ONETOUCH DELICA LANCETS 16X MISC, USE TO CHECK FASTING AND PRE-DINNER BLOOD GLUCOSE BID, Disp: , Rfl: 12 .  potassium citrate (UROCIT-K) 10 MEQ (1080 MG) SR tablet, Take 10 mEq by mouth 3 (three) times daily with meals., Disp: , Rfl:  .  ranitidine (ZANTAC) 75 MG tablet, Take 75 mg by mouth at bedtime., Disp: , Rfl:  .  rOPINIRole (REQUIP) 1 MG tablet, TAKE 1 TABLET BY MOUTH 4  TIMES DAILY, Disp: 360 tablet, Rfl: 3 .  sertraline (ZOLOFT) 100 MG tablet, TAKE 1 AND 1/2 TABLETS BY  MOUTH DAILY, Disp: 135 tablet, Rfl: 1 .  traMADol (ULTRAM) 50 MG tablet, Take 1 tablet (50 mg total) by mouth 3 (three) times daily as needed., Disp: 270 tablet, Rfl: 1 .  traZODone (DESYREL) 100 MG tablet, TAKE 1/2 TO 1 TABLET BY  MOUTH NIGHTLY FOR SLEEP, Disp: 90 tablet, Rfl: 1 .  vitamin B-12 (CYANOCOBALAMIN) 100 MCG tablet, Take 100 mcg by mouth daily., Disp: , Rfl:  .  Vitamin D, Ergocalciferol, (DRISDOL) 50000 units CAPS  capsule, Take 1 capsule (50,000 Units total) by mouth every 7 (seven) days., Disp: 4 capsule, Rfl: 0  PAST MEDICAL HISTORY: Past Medical History:  Diagnosis Date  . Allergy   . Anxiety   . Asthma   . Back pain   . Chronic kidney disease    KIDNEY STONES WITH STENTS  . Depression   . Diabetes ([place])   . Diverticula of colon   . Dyspnea   . Eczema   . Food allergy   . Gallbladder problem   . GERD (gastroesophageal reflux disease)   . Hypercholesterolemia   . Hyperlipidemia   . Hypothyroidism   . Joint pain   . Kidney stones   . Leg edema   . Migraine headache   . Multiple sclerosis ([place])   . Obesity   . Palpitations   . PONV (postoperative nausea and vomiting)   . Restless leg syndrome   . Right sided sciatica 10/29/2015  . Snoring   . Thyroid disease    hyperthyroidism  . Unsteady gait   . Vertigo   . Vision abnormalities     PAST SURGICAL HISTORY: Past Surgical History:  Procedure Laterality Date  . BREAST SURGERY     AUGMENTATION  . carpel tunnel    . CHOLECYSTECTOMY    . DILATION AND CURETTAGE OF UTERUS  1983  . FINGER SURGERY  x8  . HERNIA REPAIR    . LITHOTRIPSY    . NISSEN FUNDOPLICATION    . VENTRAL HERNIA REPAIR  07/25/2012   Procedure: LAPAROSCOPIC VENTRAL HERNIA;  Surgeon: Adin Hector, MD;  Location: [facility];  Service: General;  Laterality: N/A;    FAMILY HISTORY: Family History  Problem Relation Age of Onset  . Hyperlipidemia Mother   . Glaucoma Mother   . Hypertension Mother   . Anxiety disorder Mother   . Depression Mother   . Heart disease Father   . Cancer Father        prostate  . Diabetes Father   . Dementia Father   .  Hypertension Father   . Hyperlipidemia Father   . Sleep apnea Father   . Obesity Father   . Cancer Brother        esophageal  . Arthritis Sister        RA    SOCIAL HISTORY:  Social History   Socioeconomic History  . Marital status: Married    Spouse name: Loralai Eisman  . Number of children: 1  .  Years of education: Not on file  . Highest education level: Not on file  Occupational History  . Occupation: retired  Scientific laboratory technician  . Financial resource strain: Not on file  . Food insecurity:    Worry: Not on file    Inability: Not on file  . Transportation needs:    Medical: Not on file    Non-medical: Not on file  Tobacco Use  . Smoking status: Former Research scientist (life sciences)  . Smokeless tobacco: Never Used  Substance and Sexual Activity  . Alcohol use: No  . Drug use: No  . Sexual activity: Not on file  Lifestyle  . Physical activity:    Days per week: Not on file    Minutes per session: Not on file  . Stress: Not on file  Relationships  . Social connections:    Talks on phone: Not on file    Gets together: Not on file    Attends religious service: Not on file    Active member of club or organization: Not on file    Attends meetings of clubs or organizations: Not on file    Relationship status: Not on file  . Intimate partner violence:    Fear of current or ex partner: Not on file    Emotionally abused: Not on file    Physically abused: Not on file    Forced sexual activity: Not on file  Other Topics Concern  . Not on file  Social History Narrative  . Not on file     PHYSICAL EXAM  Vitals:   04/18/18 1456  BP: 137/73  Pulse: 87  Resp: 20  Weight: 216 lb (98 kg)  Height: '4\' 9"'  (1.448 m)    Body mass index is 46.74 kg/m.   General: The patient is well-developed and well-nourished and in no acute distress  Musculoskeletal:  Lower lumbar paraspinals are nontender.   She has reduced range of motion in the back.  Neurologic Exam  Mental status: The patient is alert and oriented x 3 at the time of the examination. The patient has apparent normal recent and remote memory, with an apparently normal attention span and concentration ability.   Speech is normal.  Cranial nerves: Extraocular movements are full.  Facial strength and sensation is normal.  Trapezius strength is  normal..  The tongue is midline, and the patient has symmetric elevation of the soft palate. No obvious hearing deficits are noted.  Motor:  Muscle bulk is normal.   Tone is normal. Strength is  5 / 5 in all 4 extremities.   Sensory: Sensory testing is intact to soft touch.     Gait and station: Station is normal.   The gait is arthritic but also a little bit wide.  The tandem gait is wide..   Romberg negative  Reflexes: Deep tendon reflexes are symmetric and normal bilaterally.       DIAGNOSTIC DATA (LABS, IMAGING, TESTING) - I reviewed patient records, labs, notes, testing and imaging myself where available.    ASSESSMENT AND PLAN  Multiple  sclerosis (HCC)  Lumbosacral facet joint syndrome  Anxiety state  Depression, unspecified depression type  Other abnormalities of gait and mobility  Obesity, unspecified classification, unspecified obesity type, unspecified whether serious comorbidity present  1.    We discussed the multiple sclerosis.  She has had some worsening of balance slowly over the last 5 years but MRI did not show any new lesions.  She has a very high co-pay for Betaseron as she does not qualify for patient assistance.  She is continuing to take Betaseron once or twice a week and prefers to do this over switching to a different medication.  We did discuss another interferon or leflunomide as a possible less expensive option 2.   Advised to take tramadol 2-3 times a day for her back pain.  She did not respond to radiofrequency ablation the last 2 times.  I would be happy to have her see a pain management doctor if she prefers. 3.  Her gait has slowly worsened over the last couple years.  It is difficult to know how much of this is due to her arthritic changes and how much to her MS.  We discussed weight loss and she will get back into see Dr. Leafy Ro.  I advised her to call if she notes further worsening of her gait and we will check an MRI of the cervical spine to rule  out spinal stenosis there.   Return to see me in 6 months or sooner if there are new or worsening neurologic symptoms.  40-minute face-to-face evaluation with greater than one half the time counseling and coordinating care about her MS, disease modifying options and pain.    Richard A. Felecia Shelling, MD, PhD 06/15/6386, 5:64 PM Certified in Neurology, Clinical Neurophysiology, Sleep Medicine, Pain Medicine and Neuroimaging  [facility] [address], [place], [place] [address] [phone]  ;,

## 2018-04-22 ENCOUNTER — Other Ambulatory Visit: Payer: Self-pay | Admitting: Neurology

## 2018-06-10 ENCOUNTER — Other Ambulatory Visit: Payer: Self-pay | Admitting: Neurology

## 2018-06-16 ENCOUNTER — Other Ambulatory Visit (INDEPENDENT_AMBULATORY_CARE_PROVIDER_SITE_OTHER): Payer: Self-pay | Admitting: Family Medicine

## 2018-06-16 DIAGNOSIS — E119 Type 2 diabetes mellitus without complications: Secondary | ICD-10-CM

## 2018-07-31 ENCOUNTER — Other Ambulatory Visit: Payer: Self-pay | Admitting: Neurology

## 2018-08-02 DIAGNOSIS — M1711 Unilateral primary osteoarthritis, right knee: Secondary | ICD-10-CM | POA: Insufficient documentation

## 2018-08-02 DIAGNOSIS — M1712 Unilateral primary osteoarthritis, left knee: Secondary | ICD-10-CM | POA: Insufficient documentation

## 2018-09-01 DIAGNOSIS — M51369 Other intervertebral disc degeneration, lumbar region without mention of lumbar back pain or lower extremity pain: Secondary | ICD-10-CM | POA: Insufficient documentation

## 2018-09-01 DIAGNOSIS — M5136 Other intervertebral disc degeneration, lumbar region: Secondary | ICD-10-CM | POA: Insufficient documentation

## 2018-10-19 ENCOUNTER — Encounter: Payer: Self-pay | Admitting: Neurology

## 2018-10-19 ENCOUNTER — Ambulatory Visit: Payer: Medicare Other | Admitting: Neurology

## 2018-10-19 VITALS — BP 119/70 | HR 94 | Ht <= 58 in | Wt 213.5 lb

## 2018-10-19 DIAGNOSIS — M47817 Spondylosis without myelopathy or radiculopathy, lumbosacral region: Secondary | ICD-10-CM | POA: Diagnosis not present

## 2018-10-19 DIAGNOSIS — F329 Major depressive disorder, single episode, unspecified: Secondary | ICD-10-CM

## 2018-10-19 DIAGNOSIS — M5431 Sciatica, right side: Secondary | ICD-10-CM

## 2018-10-19 DIAGNOSIS — G43009 Migraine without aura, not intractable, without status migrainosus: Secondary | ICD-10-CM

## 2018-10-19 DIAGNOSIS — F411 Generalized anxiety disorder: Secondary | ICD-10-CM

## 2018-10-19 DIAGNOSIS — G35 Multiple sclerosis: Secondary | ICD-10-CM | POA: Diagnosis not present

## 2018-10-19 DIAGNOSIS — M5432 Sciatica, left side: Secondary | ICD-10-CM

## 2018-10-19 DIAGNOSIS — F32A Depression, unspecified: Secondary | ICD-10-CM

## 2018-10-19 DIAGNOSIS — R2689 Other abnormalities of gait and mobility: Secondary | ICD-10-CM

## 2018-10-19 MED ORDER — MECLIZINE HCL 12.5 MG PO TABS: ORAL_TABLET | ORAL | 1 refills | Status: DC

## 2018-10-19 MED ORDER — TRAMADOL HCL 50 MG PO TABS: 50.0000 mg | ORAL_TABLET | Freq: Three times a day (TID) | ORAL | 1 refills | Status: DC | PRN

## 2018-10-19 NOTE — Progress Notes (Signed)
[facility]  PATIENT: Jocelyn Wilson DOB: 1951/05/29  REFERRING DOCTOR OR PCP:  Adaku Nnodi SOURCE: Patient  _________________________________   HISTORICAL  CHIEF COMPLAINT:  Chief Complaint  Patient presents with  . Follow-up    Rm 12 with husband. Last seen 04/2018  . Multiple Sclerosis    Taking Betaseron  . Gait Problem    Ambulating with cane    HISTORY OF PRESENT ILLNESS:  Jocelyn Wilson is a 68 y.o. woman with multiple sclerosis, back pain (facet syndrome), and migraines.  Update 10/19/2018: She is on Betaseron for her MS and she tolerates it well.   She has not had any recent exacerbations.    Gait is worse and she now needs  She is seeing [facility] for her back.   She saw Dr. Nelva Bush who felt she was not a candidate for further injections.    Her last 2 MBB RFA's did not help.  Dr. Nelva Bush may do a piriformis muscle injection.     She has tramadol but it made her sleepy and she has not taken it recently  She is more depressed and sometimes cries.   She is upset about her pain and physical abilities.    She is on Zoloft 100 mg, trazodone 100 mg (qHS) and buspar 30 mg bid.     Update 04/18/2018: She is noting more difficulty with her balance.   Her ankle gives out some and she has a couple falls breaking her foot.    Mri brain 10/2017 showed no new lesions.     Her last RFA ablation in 2017 did not help.   The first one she had with me helped but a second one did not help much.     Update 10/19/2017: She feels her MS remains stable on Betaseron. She tolerates it well and has not had any exacerbations in many years.    Her last MRI of the brain was 11/12/2015 and it shows foci in the brainstem and hemispheres consistent with MS. There was no change with compared to an MRI from 2016. Unfortunately she is having difficulty with her co-pay and she is not eligible for patient assistance.      We had a long discussion about her options (see plan) for a  DMT.  She notes that her gait is stiff when she begins to walk after sitting for a while. She denies any change in strength and sensation. She notes no change in bladder function. Vision is fine.    She sleeps well with her current medications. She has restless leg syndrome that is helped by the medications. She still notes some depression.   Headaches are doing well  Back pain is doing about the same as last visit and better than 2 years ago.   She received a lot of benefit from radiofrequency ablation of the medial branch nerves   From 06/16/2016: Her LBP is severe at times.   Pain is mostly in the right > left buttock, and across the lower back.   Pain sometimes will radiate to the proximal leg but never down towards the foot    MRI of the lumbar spine shows severe degenerative disc disease and facet hypertrophy at L4-L5.   She has had radiofrequency ablations of the medial branch nerves with benefit. She also has had ESI's, SI joint injections, piriformis muscles injections with less benefit.   She had RFA of the medial branch nerves 08/2014 with some benefit and got much more  benefit after also doing PT 11/15 - 1/16.     She felt better for one month after a bilateral piriformis trigger point injection at the last visit.  Second shot did not help as much.    She will be having RFA by Dr. Jola Baptist soon.    MS:   Her MS has been stable. She denies any recent exacerbations. She continues on Betaseron and tolerates it well. MRI 2017 has shown periventricular white matter foci consistent with multiple sclerosis. When compared to prior MRI dated 08/25/2009, there were no new lesions. The older scan also showed a small left medullary focus that was not observed on the more recent study.      She is on Betaseron.   She denies any new changes with gait, strength or sensation. She denies any significant vision or bladder problems.    Dysesthesias:   She has painful dysesthesia in her shins and feet at times.       Sleep:   She has RLS day and night but worse at night.   Ropinirole greatly helps RLS and she now takes qid.    She gets slightly sleepy at times.     Mood:  She has a chronic mild depression and often feels sad with occasional crying. She takes sertraline, BuSpar and alprazolam. She feels mood is better this visit than it was last visit.     Migraine headaches:  This is generally doing well.    MS History:   Her MS was diagnosed around 2004.    She was actually seeing me for migraine headache when she presented with optic neuritis and vertigo and had another MRI.   She had interval development of multiple new foci and underwent a lumbar puncture.  CSF was also consistent with MS.   She was placed on Betaseron and continues on that medication with good control of the MS. No definite exacerbations.    REVIEW OF SYSTEMS: Constitutional: No fevers, chills, sweats, or change in appetite Eyes: No visual changes, double vision, eye pain Ear, nose and throat: No hearing loss, ear pain, nasal congestion, sore throat Cardiovascular: No chest pain, palpitations Respiratory: No shortness of breath at rest or with exertion.   No wheezes GastrointestinaI: No nausea, vomiting, diarrhea, abdominal pain, fecal incontinence Genitourinary: No dysuria, urinary retention or frequency.  No nocturia. Musculoskeletal: No neck pain, back pain Integumentary: No rash, pruritus, skin lesions Neurological: as above Psychiatric: No depression at this time.  No anxiety Endocrine: No palpitations, diaphoresis, change in appetite, change in weigh or increased thirst Hematologic/Lymphatic: No anemia, purpura, petechiae. Allergic/Immunologic: No itchy/runny eyes, nasal congestion, recent allergic reactions, rashes  ALLERGIES: Allergies  Allergen Reactions  . Iron Anaphylaxis    Iv iron  . Ciprofloxacin Nausea Only  . Contrast Media [Iodinated Diagnostic Agents] Itching and Rash  . Sulfa Antibiotics Itching and  Rash  . Tape Rash    Adhesive tape    HOME MEDICATIONS:  Current Outpatient Medications:  .  atorvastatin (LIPITOR) 20 MG tablet, Take 20 mg by mouth daily., Disp: , Rfl:  .  busPIRone (BUSPAR) 30 MG tablet, TAKE 1 TABLET BY MOUTH 3  TIMES DAILY, Disp: 270 tablet, Rfl: 1 .  busPIRone (BUSPAR) 30 MG tablet, TAKE 1 TABLET BY MOUTH 3  TIMES A DAY, Disp: 270 tablet, Rfl: 1 .  esomeprazole (NEXIUM) 40 MG capsule, TAKE 1 CAPSULE BY MOUTH TWO TIMES DAILY, Disp: 180 capsule, Rfl: 1 .  ferrous sulfate 325 (65 FE) MG  tablet, Take 325 mg by mouth daily. , Disp: , Rfl:  .  Ibuprofen (ADVIL) 200 MG CAPS, Take 2 capsules by mouth 3 (three) times daily as needed. For migraines, Disp: , Rfl:  .  Insulin Pen Needle (BD PEN NEEDLE NANO U/F) 32G X 4 MM MISC, 1 Package by Does not apply route 2 (two) times daily., Disp: 100 each, Rfl: 0 .  Interferon Beta-1b (BETASERON) 0.3 MG KIT injection, INJECT 0.25 MG UNDER THE SKIN EVERY OTHER DAY AS DIRECTED, Disp: 42 each, Rfl: 2 .  levothyroxine (SYNTHROID, LEVOTHROID) 100 MCG tablet, TK 1 T PO QD, Disp: , Rfl: 0 .  liraglutide (VICTOZA) 18 MG/3ML SOPN, ADMINISTER 0.6 MG UNDER THE SKIN EVERY MORNING, Disp: 3 mL, Rfl: 0 .  meclizine (ANTIVERT) 12.5 MG tablet, TAKE 1 TABLET(12.5 MG) BY MOUTH THREE TIMES DAILY AS NEEDED FOR DIZZINESS, Disp: 270 tablet, Rfl: 1 .  metFORMIN (GLUCOPHAGE) 500 MG tablet, Take 1 tablet (500 mg total) by mouth 2 (two) times daily with a meal., Disp: 60 tablet, Rfl: 0 .  metoprolol succinate (TOPROL-XL) 50 MG 24 hr tablet, TAKE 1 TABLET BY MOUTH  DAILY, Disp: 90 tablet, Rfl: 3 .  ONE TOUCH ULTRA TEST test strip, USE TO CHECK FASTING AND PRE-DINNER BLOOD GLUCOSE BID, Disp: , Rfl: 12 .  ONETOUCH DELICA LANCETS 56L MISC, USE TO CHECK FASTING AND PRE-DINNER BLOOD GLUCOSE BID, Disp: , Rfl: 12 .  potassium citrate (UROCIT-K) 10 MEQ (1080 MG) SR tablet, Take 10 mEq by mouth 3 (three) times daily with meals., Disp: , Rfl:  .  rOPINIRole (REQUIP) 1 MG  tablet, TAKE 1 TABLET BY MOUTH 4  TIMES DAILY, Disp: 360 tablet, Rfl: 3 .  sertraline (ZOLOFT) 100 MG tablet, TAKE 1 AND 1/2 TABLETS BY  MOUTH DAILY, Disp: 135 tablet, Rfl: 1 .  traMADol (ULTRAM) 50 MG tablet, Take 1 tablet (50 mg total) by mouth 3 (three) times daily as needed., Disp: 270 tablet, Rfl: 1 .  traZODone (DESYREL) 100 MG tablet, TAKE 1/2 TO 1 TABLET BY  MOUTH NIGHTLY FOR SLEEP, Disp: 90 tablet, Rfl: 1 .  vitamin B-12 (CYANOCOBALAMIN) 100 MCG tablet, Take 100 mcg by mouth daily., Disp: , Rfl:  .  VITAMIN D PO, Take 1 Dose by mouth daily. OTC, Disp: , Rfl:   PAST MEDICAL HISTORY: Past Medical History:  Diagnosis Date  . Allergy   . Anxiety   . Asthma   . Back pain   . Chronic kidney disease    KIDNEY STONES WITH STENTS  . Depression   . Diabetes ([place])   . Diverticula of colon   . Dyspnea   . Eczema   . Food allergy   . Gallbladder problem   . GERD (gastroesophageal reflux disease)   . Hypercholesterolemia   . Hyperlipidemia   . Hypothyroidism   . Joint pain   . Kidney stones   . Leg edema   . Migraine headache   . Multiple sclerosis ([place])   . Obesity   . Palpitations   . PONV (postoperative nausea and vomiting)   . Restless leg syndrome   . Right sided sciatica 10/29/2015  . Snoring   . Thyroid disease    hyperthyroidism  . Unsteady gait   . Vertigo   . Vision abnormalities     PAST SURGICAL HISTORY: Past Surgical History:  Procedure Laterality Date  . BREAST SURGERY     AUGMENTATION  . carpel tunnel    . CHOLECYSTECTOMY    .  DILATION AND CURETTAGE OF UTERUS  1983  . FINGER SURGERY  x8  . HERNIA REPAIR    . LITHOTRIPSY    . NISSEN FUNDOPLICATION    . VENTRAL HERNIA REPAIR  07/25/2012   Procedure: LAPAROSCOPIC VENTRAL HERNIA;  Surgeon: Adin Hector, MD;  Location: [facility];  Service: General;  Laterality: N/A;    FAMILY HISTORY: Family History  Problem Relation Age of Onset  . Hyperlipidemia Mother   . Glaucoma Mother   . Hypertension Mother    . Anxiety disorder Mother   . Depression Mother   . Heart disease Father   . Cancer Father        prostate  . Diabetes Father   . Dementia Father   . Hypertension Father   . Hyperlipidemia Father   . Sleep apnea Father   . Obesity Father   . Cancer Brother        esophageal  . Arthritis Sister        RA    SOCIAL HISTORY:  Social History   Socioeconomic History  . Marital status: Married    Spouse name: Kharis Lapenna  . Number of children: 1  . Years of education: Not on file  . Highest education level: Not on file  Occupational History  . Occupation: retired  Scientific laboratory technician  . Financial resource strain: Not on file  . Food insecurity:    Worry: Not on file    Inability: Not on file  . Transportation needs:    Medical: Not on file    Non-medical: Not on file  Tobacco Use  . Smoking status: Former Research scientist (life sciences)  . Smokeless tobacco: Never Used  Substance and Sexual Activity  . Alcohol use: No  . Drug use: No  . Sexual activity: Not on file  Lifestyle  . Physical activity:    Days per week: Not on file    Minutes per session: Not on file  . Stress: Not on file  Relationships  . Social connections:    Talks on phone: Not on file    Gets together: Not on file    Attends religious service: Not on file    Active member of club or organization: Not on file    Attends meetings of clubs or organizations: Not on file    Relationship status: Not on file  . Intimate partner violence:    Fear of current or ex partner: Not on file    Emotionally abused: Not on file    Physically abused: Not on file    Forced sexual activity: Not on file  Other Topics Concern  . Not on file  Social History Narrative  . Not on file     PHYSICAL EXAM  Vitals:   10/19/18 1111  BP: 119/70  Pulse: 94  Weight: 213 lb 8 oz (96.8 kg)  Height: _0  (1.448 m)    Body mass index is 46.2 kg/m.   General: The patient is well-developed and well-nourished and in no acute  distress  Musculoskeletal:  Lower lumbar paraspinals are nontender.   She has reduced range of motion in the back.  Neurologic Exam  Mental status: The patient is alert and oriented x 3 at the time of the examination. The patient has apparent normal recent and remote memory, with an apparently normal attention span and concentration ability.   Speech is normal.  Cranial nerves: Extraocular movements are full.  Facial strength and sensation is normal.  Trapezius strength is normal.  The tongue is midline, and the patient has symmetric elevation of the soft palate. No obvious hearing deficits are noted.  Motor:  Muscle bulk is normal.   Tone is normal. Strength is  5 / 5 in all 4 extremities.   Sensory: Sensory testing is intact to soft touch.     Gait and station: Station is normal.   The gait is arthritic and mildly wide.  Tandem gait is wide.   Romberg negative  Reflexes: Deep tendon reflexes are symmetric and normal bilaterally.       DIAGNOSTIC DATA (LABS, IMAGING, TESTING) - I reviewed patient records, labs, notes, testing and imaging myself where available.    ASSESSMENT AND PLAN  Multiple sclerosis (HCC)  Bilateral sciatica  Lumbosacral facet joint syndrome  Depression, unspecified depression type  Anxiety state  Other abnormalities of gait and mobility  Migraine without aura and without status migrainosus, not intractable  1.    She stopped the Betaseron entirely last month.  Her co-pays were very high ($5000 for 3 months) we discussed some options.  Her MS has been mild and she has been stable for many years.  She might be able to do well off of a disease modifying therapy.  Alternatively, we can start off label leflunomide which is very similar to Aubagio.  For now she will stay off of the Betaseron and give this more thought.   2.   Advised to take tramadol 3 times a day for her back pain.  This may also help her sleep.   3.  Check MRI to determine if there is any  subclinical MS activity.  If so, we will need to have her reconsider leflunomide or and other treatment.   4.  Meclizine as needed vertigo. 5.   Continue sertraline and BuSpar.  Rreturn to see me in 6 months or sooner if there are new or worsening neurologic symptoms.  45-minute face-to-face evaluation with greater than one half the time counseling and coordinating care about her MS, disease modifying options, worsening depression pain control and other issues.Nanine Means. Felecia Shelling, MD, PhD 0/06/8118, 1:47 PM Certified in Neurology, Clinical Neurophysiology, Sleep Medicine, Pain Medicine and Neuroimaging  [facility] [address], [place], [place] [address] [phone]  ;,

## 2018-10-23 ENCOUNTER — Telehealth: Payer: Self-pay | Admitting: Neurology

## 2018-10-23 DIAGNOSIS — M47819 Spondylosis without myelopathy or radiculopathy, site unspecified: Secondary | ICD-10-CM

## 2018-10-23 DIAGNOSIS — M5410 Radiculopathy, site unspecified: Secondary | ICD-10-CM

## 2018-10-23 DIAGNOSIS — M47899 Other spondylosis, site unspecified: Secondary | ICD-10-CM

## 2018-10-23 NOTE — Telephone Encounter (Signed)
[facility] Medicare order sent to [facility]. They will reach out to the t to schedule.

## 2018-10-23 NOTE — Telephone Encounter (Signed)
Spoke to the patient and she is aware of this.. She also asked if she could have a MRI of her lower back as well.Jocelyn Wilson

## 2018-10-23 NOTE — Telephone Encounter (Signed)
Dr. Felecia Shelling- ok to order MRI lower back as well?

## 2018-10-23 NOTE — Telephone Encounter (Signed)
Noted  

## 2018-10-23 NOTE — Telephone Encounter (Signed)
Yes ok to order for radiculopathy and facet syndrome

## 2018-10-23 NOTE — Addendum Note (Signed)
Addended by: Hope Pigeon on: 10/23/2018 10:11 AM   Modules accepted: Orders

## 2018-11-03 ENCOUNTER — Ambulatory Visit
Admission: RE | Admit: 2018-11-03 | Discharge: 2018-11-03 | Disposition: A | Discharge status: Home / Self Care | Payer: Medicare Other | Source: Ambulatory Visit | Attending: Neurology | Admitting: Neurology

## 2018-11-03 DIAGNOSIS — M47819 Spondylosis without myelopathy or radiculopathy, site unspecified: Secondary | ICD-10-CM

## 2018-11-03 DIAGNOSIS — G35 Multiple sclerosis: Secondary | ICD-10-CM

## 2018-11-03 DIAGNOSIS — M47899 Other spondylosis, site unspecified: Secondary | ICD-10-CM

## 2018-11-03 DIAGNOSIS — M5410 Radiculopathy, site unspecified: Secondary | ICD-10-CM

## 2018-11-07 ENCOUNTER — Other Ambulatory Visit: Payer: Self-pay | Admitting: *Deleted

## 2018-11-07 MED ORDER — ESOMEPRAZOLE MAGNESIUM 40 MG PO CPDR: DELAYED_RELEASE_CAPSULE | ORAL | 1 refills | Status: DC

## 2018-11-08 ENCOUNTER — Other Ambulatory Visit: Payer: Self-pay | Admitting: *Deleted

## 2018-11-08 ENCOUNTER — Telehealth: Payer: Self-pay | Admitting: Neurology

## 2018-11-08 DIAGNOSIS — G9389 Other specified disorders of brain: Secondary | ICD-10-CM

## 2018-11-08 DIAGNOSIS — M4317 Spondylolisthesis, lumbosacral region: Secondary | ICD-10-CM

## 2018-11-08 MED ORDER — BUSPIRONE HCL 30 MG PO TABS: 30.0000 mg | ORAL_TABLET | Freq: Three times a day (TID) | ORAL | 1 refills | Status: DC

## 2018-11-08 NOTE — Telephone Encounter (Signed)
I spoke to Jocelyn Wilson about the MRI results.  MRI of the lumbar spine shows that there has been significant changes at L3-L4 noted on the current MRI that have progressed compared to the 2017 MRI.  Specifically, she now has spinal stenosis at L3-L4 with an AP diameter of 8.2 mm.  That diameter was 10.8 mm in 2017.  Additionally, there is about 2 mm of anterolisthesis likely due to the facet hypertrophy that has progressed.  I believe it is playing some role in her pain.  We will check x-rays with flexion and extension views.  If there is significant change, I will recommend that she see a Psychologist, sport and exercise.   If the anterolisthesis is stable, then it would be reasonable to have her try epidural steroids once or twice first (she has seen Dr. Nelva Bush in the past)  MRI of the brain shows that the MS is mild and stable.  She has a 12-minute millimeter apparent extra-axial mass in the olfactory groove on the right that was not present on MRI 11/09/2017.  It is hyperintense on T2 and FLAIR images and hypointense on T1-weighted images.  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  I am concerned about the rapid growth from not being visible to 12 mm in just 1 year.  I recommend that we repeat the MRI in about 2 months to determine stability.  She has had an allergic reaction to contrast in the past so we would do the study without.

## 2018-11-09 ENCOUNTER — Telehealth: Payer: Self-pay | Admitting: Neurology

## 2018-11-09 NOTE — Telephone Encounter (Signed)
Spoke to the patient she is aware.  °

## 2018-11-09 NOTE — Telephone Encounter (Signed)
[employer] medicare order sent to [facility]. No auth they will reach out to the pt to schedule.  °

## 2018-11-13 ENCOUNTER — Telehealth: Payer: Self-pay | Admitting: Neurology

## 2018-11-13 ENCOUNTER — Ambulatory Visit
Admission: RE | Admit: 2018-11-13 | Discharge: 2018-11-13 | Disposition: A | Discharge status: Home / Self Care | Payer: Medicare Other | Source: Ambulatory Visit | Attending: Neurology | Admitting: Neurology

## 2018-11-13 DIAGNOSIS — M47817 Spondylosis without myelopathy or radiculopathy, lumbosacral region: Secondary | ICD-10-CM

## 2018-11-13 DIAGNOSIS — M4317 Spondylolisthesis, lumbosacral region: Secondary | ICD-10-CM

## 2018-11-13 DIAGNOSIS — M4310 Spondylolisthesis, site unspecified: Secondary | ICD-10-CM

## 2018-11-13 DIAGNOSIS — M47816 Spondylosis without myelopathy or radiculopathy, lumbar region: Secondary | ICD-10-CM

## 2018-11-13 DIAGNOSIS — M5416 Radiculopathy, lumbar region: Secondary | ICD-10-CM

## 2018-11-13 DIAGNOSIS — M47819 Spondylosis without myelopathy or radiculopathy, site unspecified: Secondary | ICD-10-CM

## 2018-11-13 DIAGNOSIS — M47899 Other spondylosis, site unspecified: Secondary | ICD-10-CM

## 2018-11-14 NOTE — Telephone Encounter (Signed)
-----   Message from Britt Bottom, MD sent at 11/13/2018  7:31 PM EST ----- Please let her know that the x-ray showed that the misalignment at L3-L4 was unstable on the x-ray with flexion and extension.  Therefore, I would like her to see neurosurgery  as we discussed over the phone last week.  We can refer her to [facility]  Consult " 68 year old woman with dynamic instability at L3-L4 with 5 mm anterolisthesis and severe facet hypertrophy"

## 2018-11-14 NOTE — Telephone Encounter (Signed)
Spoke with [name] and reviewed below x-ray results. She verbalized understanding of same, is agreeable to NS referral. Referral order placed in Epic.  She will keep MRI B appt. on 01/03/19, and her 11/15/18 appt. has been r/s to 01/04/19, to discuss MRI results./fim

## 2018-11-15 ENCOUNTER — Ambulatory Visit: Payer: Medicare Other | Admitting: Neurology

## 2018-11-30 ENCOUNTER — Other Ambulatory Visit: Payer: Self-pay | Admitting: Neurosurgery

## 2018-12-01 ENCOUNTER — Telehealth: Payer: Self-pay

## 2018-12-01 NOTE — Telephone Encounter (Signed)
   [facility] Pre-operative Risk Assessment    Request for surgical clearance:  1. What type of surgery is being performed? L3-4 Anterior Lateral Lumbar Fusion with pedicle screws   2. When is this surgery scheduled? 01/02/19   3. What type of clearance is required (medical clearance vs. Pharmacy clearance to hold med vs. Both)? Medical  4. Are there any medications that need to be held prior to surgery and how long? No   5. Practice name and name of physician performing surgery? [facility]/ Dr. Earnie Larsson   6. What is your office phone number [phone]    7.   What is your office fax number [phone]  8.   Anesthesia type (None, local, MAC, general) ? General   Jocelyn Wilson 12/01/2018, 12:15 PM  _________________________________________________________________   (provider comments below)

## 2018-12-01 NOTE — Telephone Encounter (Signed)
   Primary Cardiologist: Last seen by Dr. Irish Lack in 2016  Chart reviewed as part of pre-operative protocol coverage. Because of Jocelyn Wilson's past medical history and time since last visit, he/she will require a follow-up visit in order to better assess preoperative cardiovascular risk.  Pre-op covering staff: - Please schedule appointment and call patient to inform them. - Please contact requesting surgeon's office via preferred method (i.e, phone, fax) to inform them of need for appointment prior to surgery.  If applicable, this message will also be routed to pharmacy pool and/or primary cardiologist for input on holding anticoagulant/antiplatelet agent as requested below so that this information is available at time of patient's appointment.   [place], PA  12/01/2018, 1:03 PM

## 2018-12-01 NOTE — Telephone Encounter (Signed)
LM2CB IF FAX NOT RECEIVED

## 2018-12-01 NOTE — Telephone Encounter (Signed)
Notified pt and scheduled appt for cardiac clearance 12-18-2018

## 2018-12-01 NOTE — Telephone Encounter (Signed)
FAXED VIA EPIC FAX FUNTION TO REQUESTING PARTY 

## 2018-12-15 NOTE — Progress Notes (Signed)
Cardiology Office Note   Date:  12/18/2018   ID:  Jocelyn Wilson, DOB [birth year], MRN [id number]  PCP:  Aretta Nip, MD  Cardiologist:  Dr.Varanasi   Chief Complaint  Patient presents with  . Follow-up  . Pre-op Exam  . Tachycardia     History of Present Illness: Jocelyn Wilson is a 68 y.o. female who presents for pre-operative cardiac evaluation. She has not been seen by cardiology since 12/2014., She has a history of SVT in 2008, and symptoms of palpitations. She had been placed on metoprolol at that time.  Last echo revealed normal LV function.   Other history includes, MS, GERD, Obesity, Hyperlipidemia, Hyperthyroidism, and Anxiety.   She is scheduled to have L3-4 Anterior Lateral Lumbar Fusion with pedicle screws by Dr. Earnie Larsson with [facility] on 01/02/2019.    She has no complaints of chest pain, tachycardia, or fatigue. Due to obesity, she has some complaints of DOE. She is medically compliant and has medications refilled by PCP. She is not active due to MS and chronic back pain.   Past Medical History:  Diagnosis Date  . Allergy   . Anxiety   . Asthma   . Back pain   . Chronic kidney disease    KIDNEY STONES WITH STENTS  . Depression   . Diabetes ([place])   . Diverticula of colon   . Dyspnea   . Eczema   . Food allergy   . Gallbladder problem   . GERD (gastroesophageal reflux disease)   . Hypercholesterolemia   . Hyperlipidemia   . Hypothyroidism   . Joint pain   . Kidney stones   . Leg edema   . Migraine headache   . Multiple sclerosis ([place])   . Obesity   . Palpitations   . PONV (postoperative nausea and vomiting)   . Restless leg syndrome   . Right sided sciatica 10/29/2015  . Snoring   . Thyroid disease    hyperthyroidism  . Unsteady gait   . Vertigo   . Vision abnormalities     Past Surgical History:  Procedure Laterality Date  . BREAST SURGERY     AUGMENTATION  . carpel tunnel    . CHOLECYSTECTOMY    . DILATION  AND CURETTAGE OF UTERUS  1983  . FINGER SURGERY  x8  . HERNIA REPAIR    . LITHOTRIPSY    . NISSEN FUNDOPLICATION    . VENTRAL HERNIA REPAIR  07/25/2012   Procedure: LAPAROSCOPIC VENTRAL HERNIA;  Surgeon: Adin Hector, MD;  Location: [facility];  Service: General;  Laterality: N/A;     Current Outpatient Medications  Medication Sig Dispense Refill  . atorvastatin (LIPITOR) 20 MG tablet Take 20 mg by mouth daily.    . busPIRone (BUSPAR) 30 MG tablet Take 1 tablet (30 mg total) by mouth 3 (three) times daily. 270 tablet 1  . esomeprazole (NEXIUM) 40 MG capsule TAKE 1 CAPSULE BY MOUTH TWO TIMES DAILY 180 capsule 1  . ferrous sulfate 325 (65 FE) MG tablet Take 325 mg by mouth daily.     . Ibuprofen (ADVIL) 200 MG CAPS Take 2 capsules by mouth 3 (three) times daily as needed. For migraines    . Insulin Pen Needle (BD PEN NEEDLE NANO U/F) 32G X 4 MM MISC 1 Package by Does not apply route 2 (two) times daily. 100 each 0  . Interferon Beta-1b (BETASERON) 0.3 MG KIT injection INJECT 0.25 MG UNDER THE SKIN EVERY OTHER  DAY AS DIRECTED 42 each 2  . levothyroxine (SYNTHROID, LEVOTHROID) 100 MCG tablet TK 1 T PO QD  0  . liraglutide (VICTOZA) 18 MG/3ML SOPN ADMINISTER 0.6 MG UNDER THE SKIN EVERY MORNING 3 mL 0  . meclizine (ANTIVERT) 12.5 MG tablet TAKE 1 TABLET(12.5 MG) BY MOUTH THREE TIMES DAILY AS NEEDED FOR DIZZINESS 270 tablet 1  . metFORMIN (GLUCOPHAGE) 500 MG tablet Take 1 tablet (500 mg total) by mouth 2 (two) times daily with a meal. 60 tablet 0  . metoprolol succinate (TOPROL-XL) 50 MG 24 hr tablet TAKE 1 TABLET BY MOUTH  DAILY 90 tablet 3  . ONE TOUCH ULTRA TEST test strip USE TO CHECK FASTING AND PRE-DINNER BLOOD GLUCOSE BID  12  . ONETOUCH DELICA LANCETS 56D MISC USE TO CHECK FASTING AND PRE-DINNER BLOOD GLUCOSE BID  12  . potassium citrate (UROCIT-K) 10 MEQ (1080 MG) SR tablet Take 10 mEq by mouth 3 (three) times daily with meals.    Marland Kitchen rOPINIRole (REQUIP) 1 MG tablet TAKE 1 TABLET BY MOUTH 4   TIMES DAILY 360 tablet 3  . sertraline (ZOLOFT) 100 MG tablet TAKE 1 AND 1/2 TABLETS BY  MOUTH DAILY 135 tablet 1  . traMADol (ULTRAM) 50 MG tablet Take 1 tablet (50 mg total) by mouth 3 (three) times daily as needed. 270 tablet 1  . traZODone (DESYREL) 100 MG tablet TAKE 1/2 TO 1 TABLET BY  MOUTH NIGHTLY FOR SLEEP 90 tablet 1  . vitamin B-12 (CYANOCOBALAMIN) 100 MCG tablet Take 100 mcg by mouth daily.    Marland Kitchen VITAMIN D PO Take 1 Dose by mouth daily. OTC     No current facility-administered medications for this visit.     Allergies:   Iron; Ferrous gluconate; Latex; Ciprofloxacin; Contrast media [iodinated diagnostic agents]; Other; Sulfa antibiotics; and Tape    Social History:  The patient  reports that she has quit smoking. She has never used smokeless tobacco. She reports that she does not drink alcohol or use drugs.   Family History:  The patient's family history includes Anxiety disorder in her mother; Arthritis in her sister; Cancer in her brother and father; Dementia in her father; Depression in her mother; Diabetes in her father; Glaucoma in her mother; Heart disease in her father; Hyperlipidemia in her father and mother; Hypertension in her father and mother; Obesity in her father; Sleep apnea in her father.    ROS: All other systems are reviewed and negative. Unless otherwise mentioned in H&P    PHYSICAL EXAM: VS:  BP (!) 144/88   Pulse 92   Ht '4\' 9"'  (1.448 m)   Wt 217 lb (98.4 kg)   SpO2 98%   BMI 46.96 kg/m  , BMI Body mass index is 46.96 kg/m. GEN: Well nourished, well developed, in no acute distress, morbidly obese.  HEENT: normal Neck: no JVD, carotid bruits, or masses Cardiac: RRR; no murmurs, rubs, or gallops,no edema  Respiratory:  Clear to auscultation bilaterally, normal work of breathing GI: soft, nontender, nondistended, + BS MS: no deformity or atrophy Skin: warm and dry, no rash Neuro:  Strength and sensation are intact Psych: euthymic mood, full  affect   EKG: NSR rate of 79 bpm, non-specific ST-T wave abnormalities. (No change since 05/10/2017).  Recent Labs: 02/14/2018: ALT 18; BUN 16; Creatinine, Ser 0.65; Potassium 4.2; Sodium 142    Lipid Panel    Component Value Date/Time   CHOL 179 02/14/2018 0801   TRIG 95 02/14/2018 0801  HDL 51 02/14/2018 0801   LDLCALC 109 (H) 02/14/2018 0801      Wt Readings from Last 3 Encounters:  12/18/18 217 lb (98.4 kg)  10/19/18 213 lb 8 oz (96.8 kg)  04/18/18 216 lb (98 kg)      Other studies Reviewed: See scanned documents.   ASSESSMENT AND PLAN:  1. Pre-Operative Cardiac Evaluation:  Chart reviewed as part of pre-operative protocol coverage. Given past medical history and time since last visit, based on ACC/AHA guidelines, Jocelyn Wilson would be at acceptable risk for the planned procedure without further cardiovascular testing.   Recommend follow up echocardiogram on next visit, but this will not delay scheduled procedure.   2. Hypertension: Slightly elevated today. She just received word this morning that her mother died. Her mother lived in [place]. Continue metoprolol as directed.   3. Hypercholesterolemia; Followed by PCP. LDL 109 on 02/14/2018, TC 179, HDL 41.   4. Diabetes: Followed by PCP, Hgb A1c  6.4   Current medicines are reviewed at length with the patient today.    Labs/ tests ordered today include: None Phill Myron. [place] Pugh, ANP, AACC   12/18/2018 8:35 AM    [facility] [place] [address] Office [phone] Fax [phone]

## 2018-12-18 ENCOUNTER — Encounter: Payer: Self-pay | Admitting: Adult Health

## 2018-12-18 ENCOUNTER — Other Ambulatory Visit: Payer: Self-pay

## 2018-12-18 ENCOUNTER — Ambulatory Visit (INDEPENDENT_AMBULATORY_CARE_PROVIDER_SITE_OTHER): Payer: Medicare Other | Admitting: Adult Health

## 2018-12-18 VITALS — BP 144/88 | HR 92 | Ht <= 58 in | Wt 217.0 lb

## 2018-12-18 DIAGNOSIS — I10 Essential (primary) hypertension: Secondary | ICD-10-CM

## 2018-12-18 DIAGNOSIS — E7800 Pure hypercholesterolemia, unspecified: Secondary | ICD-10-CM

## 2018-12-18 DIAGNOSIS — I471 Supraventricular tachycardia: Secondary | ICD-10-CM | POA: Diagnosis not present

## 2018-12-18 DIAGNOSIS — Z01810 Encounter for preprocedural cardiovascular examination: Secondary | ICD-10-CM

## 2018-12-18 NOTE — Patient Instructions (Signed)
Cleared for Anterior Lateral Lumbar Fusion-[facility]/ Dr. Earnie Larsson  Follow-Up: You will need a follow up appointment in 12 months.  Please call our office 2 months in advance, JAN 2021, to schedule this, [facility] 2021, appointment.  You may see Larae Grooms, MD or one of the following Advanced Practice Providers on your designated Care Team:  Lyda Jester, PA-C  Dayna Dunn, PA-C  Ermalinda Barrios, PA-C      Medication Instructions:  NO CHANGES- Your physician recommends that you continue on your current medications as directed. Please refer to the Current Medication list given to you today. If you need a refill on your cardiac medications before your next appointment, please call your pharmacy. Labwork: When you have labs (blood work) and your tests are completely normal, you will receive your results ONLY by [place] (if you have MyChart) -OR- A paper copy in the mail.  At [facility], you and your health needs are our priority.  As part of our continuing mission to provide you with exceptional heart care, we have created designated Provider Care Teams.  These Care Teams include your primary Cardiologist (physician) and Advanced Practice Providers (APPs -  Physician Assistants and Nurse Practitioners) who all work together to provide you with the care you need, when you need it.  Thank you for choosing [facility] at [facility]!!

## 2018-12-22 NOTE — Pre-Procedure Instructions (Signed)
Timisha Mondry  12/22/2018      [id number] [place], [address] AT [place] [address] [place] [address] Phone: [phone] Fax: [phone]  [place], [place] [facility] [address] [place] [address] [place] [address] Phone: [phone] Fax: [phone]    Your procedure is scheduled on March 24th.  Report to [facility] Entrance "A" Admitting at 6:00 A.M.  Call this number if you have problems the morning of surgery:  [phone]   Remember:  Do not eat or drink after midnight.     Take these medicines the morning of surgery with A SIP OF WATER   Atorvastatin (Lipitor)  Nexium - if needed  Levothyroxine (Synthroid)  Meclizine   Metoprolol  Buspar  Ropinirole (Requip)  7 days prior to surgery STOP taking any Aspirin (unless otherwise instructed by your surgeon), Aleve, Naproxen, Ibuprofen, Motrin, Advil, Goody's, BC's, all herbal medications, fish oil, and all vitamins.   WHAT DO I DO ABOUT MY DIABETES MEDICATION?   Marland Kitchen Do not take oral diabetes medicines (pills) the morning of surgery. - Metformin & Jardiance   . The day of surgery, do not take Victoza (liraglutide).    How to Manage Your Diabetes Before and After Surgery  Why is it important to control my blood sugar before and after surgery? . Improving blood sugar levels before and after surgery helps healing and can limit problems. . A way of improving blood sugar control is eating a healthy diet by: o  Eating less sugar and carbohydrates o  Increasing activity/exercise o  Talking with your doctor about reaching your blood sugar goals . High blood sugars (greater than 180 mg/dL) can raise your risk of infections and slow your recovery, so you will need to focus on controlling your diabetes during the weeks before surgery. . Make sure that the doctor who takes care of your diabetes knows about  your planned surgery including the date and location.  How do I manage my blood sugar before surgery? . Check your blood sugar at least 4 times a day, starting 2 days before surgery, to make sure that the level is not too high or low. o Check your blood sugar the morning of your surgery when you wake up and every 2 hours until you get to the Short Stay unit. . If your blood sugar is less than 70 mg/dL, you will need to treat for low blood sugar: o Do not take insulin. o Treat a low blood sugar (less than 70 mg/dL) with  cup of clear juice (cranberry or apple), 4 glucose tablets, OR glucose gel. o Recheck blood sugar in 15 minutes after treatment (to make sure it is greater than 70 mg/dL). If your blood sugar is not greater than 70 mg/dL on recheck, call [phone] for further instructions. . Report your blood sugar to the short stay nurse when you get to Short Stay.  . If you are admitted to the hospital after surgery: o Your blood sugar will be checked by the staff and you will probably be given insulin after surgery (instead of oral diabetes medicines) to make sure you have good blood sugar levels. o The goal for blood sugar control after surgery is 80-180 mg/dL.     Do not wear jewelry, make-up or nail polish.  Do not wear lotions, powders, or perfumes, or deodorant.  Do not shave  48 hours prior to surgery.    Do not bring valuables to the hospital.  [facility] is not responsible for any belongings or valuables.   [place]- Preparing For Surgery  Before surgery, you can play an important role. Because skin is not sterile, your skin needs to be as free of germs as possible. You can reduce the number of germs on your skin by washing with CHG (chlorahexidine gluconate) Soap before surgery.  CHG is an antiseptic cleaner which kills germs and bonds with the skin to continue killing germs even after washing.    Oral Hygiene is also important to reduce your risk of infection.  Remember  - BRUSH YOUR TEETH THE MORNING OF SURGERY WITH YOUR REGULAR TOOTHPASTE  Please do not use if you have an allergy to CHG or antibacterial soaps. If your skin becomes reddened/irritated stop using the CHG.  Do not shave (including legs and underarms) for at least 48 hours prior to first CHG shower. It is OK to shave your face.  Please follow these instructions carefully.   1. Shower the NIGHT BEFORE SURGERY and the MORNING OF SURGERY with CHG.   2. If you chose to wash your hair, wash your hair first as usual with your normal shampoo.  3. After you shampoo, rinse your hair and body thoroughly to remove the shampoo.  4. Use CHG as you would any other liquid soap. You can apply CHG directly to the skin and wash gently with a scrungie or a clean washcloth.   5. Apply the CHG Soap to your body ONLY FROM THE NECK DOWN.  Do not use on open wounds or open sores. Avoid contact with your eyes, ears, mouth and genitals (private parts). Wash Face and genitals (private parts)  with your normal soap.  6. Wash thoroughly, paying special attention to the area where your surgery will be performed.  7. Thoroughly rinse your body with warm water from the neck down.  8. DO NOT shower/wash with your normal soap after using and rinsing off the CHG Soap.  9. Pat yourself dry with a CLEAN TOWEL.  10. Wear CLEAN PAJAMAS to bed the night before surgery, wear comfortable clothes the morning of surgery  11. Place CLEAN SHEETS on your bed the night of your first shower and DO NOT SLEEP WITH PETS.   Day of Surgery:  Do not apply any deodorants/lotions.  Please wear clean clothes to the hospital/surgery center.   Remember to brush your teeth WITH YOUR REGULAR TOOTHPASTE.   Contacts, dentures or bridgework may not be worn into surgery.  Leave your suitcase in the car.  After surgery it may be brought to your room.  For patients admitted to the hospital, discharge time will be determined by your treatment  team.  Patients discharged the day of surgery will not be allowed to drive home.    Please read over the following fact sheets that you were given. Coughing and Deep Breathing and Surgical Site Infection Prevention

## 2018-12-25 ENCOUNTER — Encounter (HOSPITAL_COMMUNITY): Payer: Self-pay

## 2018-12-25 ENCOUNTER — Encounter (HOSPITAL_COMMUNITY)
Admission: RE | Admit: 2018-12-25 | Discharge: 2018-12-25 | Disposition: A | Discharge status: Home / Self Care | Payer: Medicare Other | Source: Ambulatory Visit | Attending: Neurosurgery | Admitting: Neurosurgery

## 2018-12-25 ENCOUNTER — Encounter (HOSPITAL_COMMUNITY): Payer: Self-pay | Admitting: Physician Assistant

## 2018-12-25 ENCOUNTER — Other Ambulatory Visit: Payer: Self-pay

## 2018-12-25 DIAGNOSIS — Z01812 Encounter for preprocedural laboratory examination: Secondary | ICD-10-CM | POA: Insufficient documentation

## 2018-12-25 HISTORY — DX: Personal history of urinary calculi: Z87.442

## 2018-12-25 LAB — CBC WITH DIFFERENTIAL/PLATELET
Abs Immature Granulocytes: 0.08 10*3/uL — ABNORMAL HIGH (ref 0.00–0.07)
BASOS ABS: 0.1 10*3/uL (ref 0.0–0.1)
Basophils Relative: 1 %
Eosinophils Absolute: 0.2 10*3/uL (ref 0.0–0.5)
Eosinophils Relative: 3 %
HCT: 46.3 % — ABNORMAL HIGH (ref 36.0–46.0)
HEMOGLOBIN: 14 g/dL (ref 12.0–15.0)
Immature Granulocytes: 1 %
Lymphocytes Relative: 20 %
Lymphs Abs: 1.4 10*3/uL (ref 0.7–4.0)
MCH: 27.9 pg (ref 26.0–34.0)
MCHC: 30.2 g/dL (ref 30.0–36.0)
MCV: 92.2 fL (ref 80.0–100.0)
Monocytes Absolute: 0.6 10*3/uL (ref 0.1–1.0)
Monocytes Relative: 8 %
NEUTROS PCT: 67 %
NRBC: 0 % (ref 0.0–0.2)
Neutro Abs: 4.7 10*3/uL (ref 1.7–7.7)
Platelets: 257 10*3/uL (ref 150–400)
RBC: 5.02 MIL/uL (ref 3.87–5.11)
RDW: 14.2 % (ref 11.5–15.5)
WBC: 7 10*3/uL (ref 4.0–10.5)

## 2018-12-25 LAB — TYPE AND SCREEN
ABO/RH(D): O NEG
Antibody Screen: NEGATIVE

## 2018-12-25 LAB — BASIC METABOLIC PANEL
ANION GAP: 12 (ref 5–15)
BUN: 16 mg/dL (ref 8–23)
CO2: 21 mmol/L — ABNORMAL LOW (ref 22–32)
Calcium: 9.5 mg/dL (ref 8.9–10.3)
Chloride: 108 mmol/L (ref 98–111)
Creatinine, Ser: 0.82 mg/dL (ref 0.44–1.00)
GFR calc Af Amer: >60 mL/min (ref >60)
GFR calc non Af Amer: >60 mL/min (ref >60)
Glucose, Bld: 168 mg/dL — ABNORMAL HIGH (ref 70–99)
Potassium: 4.3 mmol/L (ref 3.5–5.1)
Sodium: 141 mmol/L (ref 135–145)

## 2018-12-25 LAB — HEMOGLOBIN A1C
HEMOGLOBIN A1C: 7 % — AB (ref 4.8–5.6)
Mean Plasma Glucose: 154.2 mg/dL

## 2018-12-25 LAB — SURGICAL PCR SCREEN
MRSA, PCR: NEGATIVE
Staphylococcus aureus: NEGATIVE

## 2018-12-25 LAB — GLUCOSE, CAPILLARY: Glucose-Capillary: 156 mg/dL — ABNORMAL HIGH (ref 70–99)

## 2018-12-25 NOTE — Progress Notes (Signed)
PCP - [place] Cardiologist - Dr. Irish Lack  Chest x-ray - N/A EKG - 12/18/18  ECHO - 2008  SA - denies  DM - Type 2 Fasting Blood Sugar - 100-130s Pt stated she does not check blood sugars every day  Anesthesia review: yes, EKG  Patient denies shortness of breath, fever, cough and chest pain at PAT appointment   Patient verbalized understanding of instructions that were given to them at the PAT appointment. Patient was also instructed that they will need to review over the PAT instructions again at home before surgery.

## 2018-12-25 NOTE — Anesthesia Preprocedure Evaluation (Deleted)
Anesthesia Evaluation    Airway        Dental   Pulmonary former smoker,           Cardiovascular      Neuro/Psych    GI/Hepatic   Endo/Other  diabetes  Renal/GU      Musculoskeletal   Abdominal   Peds  Hematology   Anesthesia Other Findings   Reproductive/Obstetrics                             Anesthesia Physical Anesthesia Plan  ASA:   Anesthesia Plan:    Post-op Pain Management:    Induction:   PONV Risk Score and Plan:   Airway Management Planned:   Additional Equipment:   Intra-op Plan:   Post-operative Plan:   Informed Consent:   Plan Discussed with:   Anesthesia Plan Comments: (History of SVT and Palpitations. Cleared by cardiology 12/18/18, no further testing needed prior to surgery. )        Anesthesia Quick Evaluation

## 2018-12-26 LAB — ABO/RH: ABO/RH(D): O NEG

## 2019-01-02 ENCOUNTER — Inpatient Hospital Stay: Admit: 2019-01-02 | Payer: Medicare Other | Admitting: Neurosurgery

## 2019-01-02 SURGERY — ANTERIOR LATERAL LUMBAR FUSION WITH PERCUTANEOUS SCREW 1 LEVEL
Anesthesia: General | Laterality: Left

## 2019-01-03 ENCOUNTER — Other Ambulatory Visit: Payer: Medicare Other

## 2019-01-04 ENCOUNTER — Ambulatory Visit: Payer: Self-pay | Admitting: Neurology

## 2019-02-10 ENCOUNTER — Other Ambulatory Visit: Payer: Self-pay | Admitting: Neurology

## 2019-04-16 ENCOUNTER — Other Ambulatory Visit: Payer: Self-pay | Admitting: Neurology

## 2019-04-16 ENCOUNTER — Telehealth: Payer: Self-pay

## 2019-04-16 NOTE — Telephone Encounter (Signed)
Pt called in requesting 04/19/19 be changed to a virtual visit.  I confirmed with Terrence Dupont ok to transition virtual visit.    Pt understands that although there may be some limitations with this type of visit, we will take all precautions to reduce any security or privacy concerns.  Pt understands that this will be treated like an in office visit and we will file with pt's insurance, and there may be a patient responsible charge related to this service.  I have submitted the link for my chart visit with Dr. Felecia Shelling on 04/19/19. Chart has been updated for visit.

## 2019-04-19 ENCOUNTER — Telehealth (INDEPENDENT_AMBULATORY_CARE_PROVIDER_SITE_OTHER): Payer: Medicare Other | Admitting: Neurology

## 2019-04-19 ENCOUNTER — Encounter: Payer: Self-pay | Admitting: Neurology

## 2019-04-19 DIAGNOSIS — E119 Type 2 diabetes mellitus without complications: Secondary | ICD-10-CM

## 2019-04-19 DIAGNOSIS — G4719 Other hypersomnia: Secondary | ICD-10-CM | POA: Diagnosis not present

## 2019-04-19 DIAGNOSIS — G43009 Migraine without aura, not intractable, without status migrainosus: Secondary | ICD-10-CM

## 2019-04-19 DIAGNOSIS — D329 Benign neoplasm of meninges, unspecified: Secondary | ICD-10-CM | POA: Insufficient documentation

## 2019-04-19 DIAGNOSIS — R0683 Snoring: Secondary | ICD-10-CM | POA: Insufficient documentation

## 2019-04-19 DIAGNOSIS — G35 Multiple sclerosis: Secondary | ICD-10-CM

## 2019-04-19 DIAGNOSIS — M47817 Spondylosis without myelopathy or radiculopathy, lumbosacral region: Secondary | ICD-10-CM

## 2019-04-19 NOTE — Progress Notes (Signed)
[facility]  PATIENT: Jocelyn Wilson DOB: [birth year]  REFERRING DOCTOR OR PCP:  Adaku Nnodi SOURCE: Patient  _________________________________   HISTORICAL  CHIEF COMPLAINT:  No chief complaint on file.   HISTORY OF PRESENT ILLNESS:  Jocelyn Wilson is a 68 y.o. woman with multiple sclerosis, back pain (facet syndrome), and migraines.   Update 04/19/2019: Virtual Visit via Video Note I connected with@ on 04/19/19 at 11:30 AM EDT by a video enabled telemedicine application and verified that I am speaking with the correct person.  I discussed the limitations of evaluation and management by telemedicine and the availability of in person appointments. The patient expressed understanding and agreed to proceed.  History of Present Illness: She saw Dr. Annette Stable and was scheduled for surgery.   However, she had her Neurosurgery cancelled twice due to Covid-19.   She reports Dr. Radene Ou is very concerned about her risk of infection with surgery.   For pain, she is on tramadol 50 mg but rarely takes it.   We discussed trying tid to see if more benefit.  Her MS seems stable,   She is off Betaseron and has had no exacerbations or new symptosm.  Gait is affected mostly by LBP.   No new weakness, numbness or visual change.    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    The MS should not add significant risk and she is not on any immunomodulatory therapy.    I personally reviewed her MRI lumbar and brain from 11/03/2018 and xray from 11/13/2018: 11/03/18  MRI of the lumbar spine shows that there has been significant changes at L3-L4 noted on the current MRI that have progressed compared to the 2017 MRI.  Specifically, she now has spinal stenosis at L3-L4 with an AP diameter of 8.2 mm.  That diameter was 10.8 mm in 2017.  Additionally, there is about 2 mm of anterolisthesis likely due to the facet  hypertrophy that has progressed.  I believe it is playing some role in her pain.     11/03/2018 MRI of the brain shows that the MS is mild and stable.  She has a 12-millimeter extra-axial mass in the olfactory groove on the right that was not present on MRI 11/09/2017.  It is hyperintense on T2 and FLAIR images and hypointense on T1-weighted images.  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  I am concerned about the rapid growth from not being visible to 12 mm in just 1 year.  I recommend that we repeat the MRI.   She has had an allergic reaction to contrast in the past so we would do the study without.  11/13/2018  Lumbar flex/ext  IMPRESSION: Mild dynamic instability at the L3-4 level, up to 5 mm anterolisthesis with patient standing in flexion. This is facet mediated, based on plain film appearance as well as review of MR.  Severe degenerative disease L4-5, near complete loss of interspace Height.  EPWORTH SLEEPINESS SCALE  On a scale of 0 - 3 what is the chance of dozing:  Sitting and Reading:  2   Watching TV:   2 Sitting inactive in a public place: 0 Passenger in car for one hour: 3 Lying down to rest in the afternoon: 3 Sitting and talking to someone: 0 Sitting quietly after lunch:  1 In a car, stopped in traffic:  0  Total (out of 24): 11/24 mild excessive  daytime sleepiness.      Observations/Objective:   She is a well-developed well-nourished woman in no acute distress.  The head is normocephalic and atraumatic.  Sclera are anicteric.  Visible skin appears normal.  The neck has a good range of motion.    She is alert and fully oriented with fluent speech and good attention, knowledge and memory.  Extraocular muscles are intact.  Facial strength is normal.   She appears to have normal strength in the arms.  Rapid alternating movements and finger-nose-finger are performed well.  Assessment and Plan: 1. Multiple sclerosis ([place])   2. Meningioma ([place])   3.  Excessive daytime sleepiness   4. Snoring   5. Migraine without aura and without status migrainosus, not intractable   6. Type 2 diabetes mellitus without complication, without long-term current use of insulin (HCC)   7. Lumbosacral facet joint syndrome    1.  Her MS is stable and she will continue off of a disease modifying therapy.. 2.  She has a probable meningioma that was 12 mm on the January 2020 MRI but not evident on the January 2019 MRI.  We need to reassess with an MRI to determine if it is rapidly growing. 3.   For back pain, she will increase the tramadol to 3 times a day.  Advised to follow-up with Dr. Annette Stable if tramadol does not help enough for possible surgery. 4.    Home sleep study for possible sleep apnea.  She has snoring and excessive daytime sleepiness. 5.v follow-up in 4 months or sooner if there are new or worsening neurologic symptoms.   Follow Up Instructions: I discussed the assessment and treatment plan with the patient. The patient was provided an opportunity to ask questions and all were answered. The patient agreed with the plan and demonstrated an understanding of the instructions.    The patient was advised to call back or seek an in-person evaluation if the symptoms worsen or if the condition fails to improve as anticipated.  I provided 25 minutes of non-face-to-face time during this encounter.  Update 10/19/2018: She is on Betaseron for her MS and she tolerates it well.   She has not had any recent exacerbations.    Gait is worse and she now needs  She is seeing [facility] for her back.   She saw Dr. Nelva Bush who felt she was not a candidate for further injections.    Her last 2 MBB RFA's did not help.  Dr. Nelva Bush may do a piriformis muscle injection.     She has tramadol but it made her sleepy and she has not taken it recently  She is more depressed and sometimes cries.   She is upset about her pain and physical abilities.    She is on Zoloft 100 mg, trazodone 100  mg (qHS) and buspar 30 mg bid.     Update 04/18/2018: She is noting more difficulty with her balance.   Her ankle gives out some and she has a couple falls breaking her foot.    Mri brain 10/2017 showed no new lesions.     Her last RFA ablation in 2017 did not help.   The first one she had with me helped but a second one did not help much.     Update 10/19/2017: She feels her MS remains stable on Betaseron. She tolerates it well and has not had any exacerbations in many years.    Her last MRI of the brain was  11/12/2015 and it shows foci in the brainstem and hemispheres consistent with MS. There was no change with compared to an MRI from 2016. Unfortunately she is having difficulty with her co-pay and she is not eligible for patient assistance.      We had a long discussion about her options (see plan) for a DMT.  She notes that her gait is stiff when she begins to walk after sitting for a while. She denies any change in strength and sensation. She notes no change in bladder function. Vision is fine.    She sleeps well with her current medications. She has restless leg syndrome that is helped by the medications. She still notes some depression.   Headaches are doing well  Back pain is doing about the same as last visit and better than 2 years ago.   She received a lot of benefit from radiofrequency ablation of the medial branch nerves   From 06/16/2016: Her LBP is severe at times.   Pain is mostly in the right > left buttock, and across the lower back.   Pain sometimes will radiate to the proximal leg but never down towards the foot    MRI of the lumbar spine shows severe degenerative disc disease and facet hypertrophy at L4-L5.   She has had radiofrequency ablations of the medial branch nerves with benefit. She also has had ESI's, SI joint injections, piriformis muscles injections with less benefit.   She had RFA of the medial branch nerves 08/2014 with some benefit and got much more benefit after  also doing PT 11/15 - 1/16.     She felt better for one month after a bilateral piriformis trigger point injection at the last visit.  Second shot did not help as much.    She will be having RFA by Dr. Jola Baptist soon.    MS:   Her MS has been stable. She denies any recent exacerbations. She continues on Betaseron and tolerates it well. MRI 2017 has shown periventricular white matter foci consistent with multiple sclerosis. When compared to prior MRI dated 08/25/2009, there were no new lesions. The older scan also showed a small left medullary focus that was not observed on the more recent study.      She is on Betaseron.   She denies any new changes with gait, strength or sensation. She denies any significant vision or bladder problems.    Dysesthesias:   She has painful dysesthesia in her shins and feet at times.     Sleep:   She has RLS day and night but worse at night.   Ropinirole greatly helps RLS and she now takes qid.    She gets slightly sleepy at times.     Mood:  She has a chronic mild depression and often feels sad with occasional crying. She takes sertraline, BuSpar and alprazolam. She feels mood is better this visit than it was last visit.     Migraine headaches:  This is generally doing well.    MS History:   Her MS was diagnosed around 2004.    She was actually seeing me for migraine headache when she presented with optic neuritis and vertigo and had another MRI.   She had interval development of multiple new foci and underwent a lumbar puncture.  CSF was also consistent with MS.   She was placed on Betaseron and continues on that medication with good control of the MS. No definite exacerbations.    REVIEW OF SYSTEMS: Constitutional:  No fevers, chills, sweats, or change in appetite Eyes: No visual changes, double vision, eye pain Ear, nose and throat: No hearing loss, ear pain, nasal congestion, sore throat Cardiovascular: No chest pain, palpitations Respiratory: No shortness of  breath at rest or with exertion.   No wheezes GastrointestinaI: No nausea, vomiting, diarrhea, abdominal pain, fecal incontinence Genitourinary: No dysuria, urinary retention or frequency.  No nocturia. Musculoskeletal: No neck pain, back pain Integumentary: No rash, pruritus, skin lesions Neurological: as above Psychiatric: No depression at this time.  No anxiety Endocrine: No palpitations, diaphoresis, change in appetite, change in weigh or increased thirst Hematologic/Lymphatic: No anemia, purpura, petechiae. Allergic/Immunologic: No itchy/runny eyes, nasal congestion, recent allergic reactions, rashes  ALLERGIES: Allergies  Allergen Reactions  . Iron Anaphylaxis    Iv iron  . Ferrous Gluconate   . Ciprofloxacin Nausea Only  . Contrast Media [Iodinated Diagnostic Agents] Itching and Rash  . Latex Rash  . Sulfa Antibiotics Itching and Rash  . Tape Rash    Adhesive tape    HOME MEDICATIONS:  Current Outpatient Medications:  .  atorvastatin (LIPITOR) 20 MG tablet, Take 20 mg by mouth daily., Disp: , Rfl:  .  busPIRone (BUSPAR) 30 MG tablet, TAKE 1 TABLET BY MOUTH 3  TIMES DAILY, Disp: 270 tablet, Rfl: 1 .  empagliflozin (JARDIANCE) 25 MG TABS tablet, Take 25 mg by mouth daily., Disp: , Rfl:  .  esomeprazole (NEXIUM) 40 MG capsule, TAKE 1 CAPSULE BY MOUTH TWO TIMES DAILY, Disp: 180 capsule, Rfl: 1 .  ferrous sulfate 325 (65 FE) MG tablet, Take 325 mg by mouth daily. , Disp: , Rfl:  .  Ibuprofen (ADVIL) 200 MG CAPS, Take 400 mg by mouth 3 (three) times daily as needed (headache or migraine). , Disp: , Rfl:  .  Insulin Pen Needle (BD PEN NEEDLE NANO U/F) 32G X 4 MM MISC, 1 Package by Does not apply route 2 (two) times daily., Disp: 100 each, Rfl: 0 .  Interferon Beta-1b (BETASERON) 0.3 MG KIT injection, INJECT 0.25 MG UNDER THE SKIN EVERY OTHER DAY AS DIRECTED (Patient not taking: Reported on 12/20/2018), Disp: 42 each, Rfl: 2 .  levothyroxine (SYNTHROID, LEVOTHROID) 100 MCG  tablet, Take 100 mcg by mouth daily before breakfast. , Disp: , Rfl: 0 .  liraglutide (VICTOZA) 18 MG/3ML SOPN, ADMINISTER 0.6 MG UNDER THE SKIN EVERY MORNING (Patient taking differently: Inject 0.6 mg into the skin daily with breakfast. ), Disp: 3 mL, Rfl: 0 .  meclizine (ANTIVERT) 12.5 MG tablet, TAKE 1 TABLET(12.5 MG) BY MOUTH THREE TIMES DAILY AS NEEDED FOR DIZZINESS (Patient taking differently: Take 12.5 mg by mouth 3 (three) times daily as needed for dizziness. TAKE 1 TABLET(12.5 MG) BY MOUTH THREE TIMES DAILY AS NEEDED FOR DIZZINESS), Disp: 270 tablet, Rfl: 1 .  metFORMIN (GLUCOPHAGE) 500 MG tablet, Take 1 tablet (500 mg total) by mouth 2 (two) times daily with a meal., Disp: 60 tablet, Rfl: 0 .  metoprolol succinate (TOPROL-XL) 50 MG 24 hr tablet, TAKE 1 TABLET BY MOUTH  DAILY (Patient taking differently: Take 50 mg by mouth daily. ), Disp: 90 tablet, Rfl: 3 .  ONE TOUCH ULTRA TEST test strip, USE TO CHECK FASTING AND PRE-DINNER BLOOD GLUCOSE BID, Disp: , Rfl: 12 .  ONETOUCH DELICA LANCETS 71G MISC, USE TO CHECK FASTING AND PRE-DINNER BLOOD GLUCOSE BID, Disp: , Rfl: 12 .  potassium citrate (UROCIT-K) 10 MEQ (1080 MG) SR tablet, Take 20 mEq by mouth 3 (three) times daily with meals. ,  Disp: , Rfl:  .  rOPINIRole (REQUIP) 1 MG tablet, TAKE 1 TABLET BY MOUTH 4  TIMES DAILY (Patient taking differently: Take 1 mg by mouth 4 (four) times daily. ), Disp: 360 tablet, Rfl: 3 .  sertraline (ZOLOFT) 100 MG tablet, TAKE 1 AND 1/2 TABLETS BY  MOUTH DAILY, Disp: 135 tablet, Rfl: 1 .  traMADol (ULTRAM) 50 MG tablet, Take 1 tablet (50 mg total) by mouth 3 (three) times daily as needed. (Patient taking differently: Take 50 mg by mouth 3 (three) times daily as needed for moderate pain. ), Disp: 270 tablet, Rfl: 1 .  traZODone (DESYREL) 100 MG tablet, TAKE 1/2 TO 1 TABLET BY  MOUTH NIGHTLY FOR SLEEP (Patient taking differently: Take 50-100 mg by mouth at bedtime. ), Disp: 90 tablet, Rfl: 1 .  vitamin B-12  (CYANOCOBALAMIN) 100 MCG tablet, Take 100 mcg by mouth daily., Disp: , Rfl:  .  VITAMIN D PO, Take 2,000 Units by mouth daily. , Disp: , Rfl:   PAST MEDICAL HISTORY: Past Medical History:  Diagnosis Date  . Allergy   . Anxiety   . Asthma   . Back pain   . Chronic kidney disease    KIDNEY STONES WITH STENTS  . Depression   . Diabetes ([place])   . Diverticula of colon   . Dyspnea   . Eczema   . Food allergy   . Gallbladder problem   . GERD (gastroesophageal reflux disease)   . History of kidney stones   . Hypercholesterolemia   . Hyperlipidemia   . Hypothyroidism   . Joint pain   . Kidney stones   . Leg edema   . Migraine headache   . Multiple sclerosis ([place])   . Obesity   . Palpitations   . PONV (postoperative nausea and vomiting)   . Restless leg syndrome   . Right sided sciatica 10/29/2015  . Snoring   . Thyroid disease    hyperthyroidism  . Unsteady gait   . Vertigo   . Vision abnormalities     PAST SURGICAL HISTORY: Past Surgical History:  Procedure Laterality Date  . BREAST SURGERY     AUGMENTATION  . carpel tunnel    . CHOLECYSTECTOMY    . DILATION AND CURETTAGE OF UTERUS  1983  . FINGER SURGERY  x8  . HERNIA REPAIR    . LITHOTRIPSY    . NISSEN FUNDOPLICATION    . VENTRAL HERNIA REPAIR  07/25/2012   Procedure: LAPAROSCOPIC VENTRAL HERNIA;  Surgeon: Adin Hector, MD;  Location: [facility];  Service: General;  Laterality: N/A;    FAMILY HISTORY: Family History  Problem Relation Age of Onset  . Hyperlipidemia Mother   . Glaucoma Mother   . Hypertension Mother   . Anxiety disorder Mother   . Depression Mother   . Heart disease Father   . Cancer Father        prostate  . Diabetes Father   . Dementia Father   . Hypertension Father   . Hyperlipidemia Father   . Sleep apnea Father   . Obesity Father   . Cancer Brother        esophageal  . Arthritis Sister        RA    SOCIAL HISTORY:  Social History   Socioeconomic History  . Marital  status: Married    Spouse name: Sallee Hogrefe  . Number of children: 1  . Years of education: Not on file  . Highest education level:  Not on file  Occupational History  . Occupation: retired  Scientific laboratory technician  . Financial resource strain: Not on file  . Food insecurity    Worry: Not on file    Inability: Not on file  . Transportation needs    Medical: Not on file    Non-medical: Not on file  Tobacco Use  . Smoking status: Former Research scientist (life sciences)  . Smokeless tobacco: Never Used  Substance and Sexual Activity  . Alcohol use: No  . Drug use: No  . Sexual activity: Not on file  Lifestyle  . Physical activity    Days per week: Not on file    Minutes per session: Not on file  . Stress: Not on file  Relationships  . Social Herbalist on phone: Not on file    Gets together: Not on file    Attends religious service: Not on file    Active member of club or organization: Not on file    Attends meetings of clubs or organizations: Not on file    Relationship status: Not on file  . Intimate partner violence    Fear of current or ex partner: Not on file    Emotionally abused: Not on file    Physically abused: Not on file    Forced sexual activity: Not on file  Other Topics Concern  . Not on file  Social History Narrative  . Not on file     PHYSICAL EXAM  There were no vitals filed for this visit.  There is no height or weight on file to calculate BMI.   General: The patient is well-developed and well-nourished and in no acute distress  Musculoskeletal:  Lower lumbar paraspinals are nontender.   She has reduced range of motion in the back.  Neurologic Exam  Mental status: The patient is alert and oriented x 3 at the time of the examination. The patient has apparent normal recent and remote memory, with an apparently normal attention span and concentration ability.   Speech is normal.  Cranial nerves: Extraocular movements are full.  Facial strength and sensation is normal.   Trapezius strength is normal.  The tongue is midline, and the patient has symmetric elevation of the soft palate. No obvious hearing deficits are noted.  Motor:  Muscle bulk is normal.   Tone is normal. Strength is  5 / 5 in all 4 extremities.   Sensory: Sensory testing is intact to soft touch.     Gait and station: Station is normal.   The gait is arthritic and mildly wide.  Tandem gait is wide.   Romberg negative  Reflexes: Deep tendon reflexes are symmetric and normal bilaterally.      Jocelyn Jamie A. Felecia Shelling, MD, PhD 10/19/6220, 97:98 AM Certified in Neurology, Clinical Neurophysiology, Sleep Medicine, Pain Medicine and Neuroimaging  [facility] [address], [place], [place] [address] [phone]  ;,

## 2019-04-23 ENCOUNTER — Telehealth: Payer: Self-pay | Admitting: Neurology

## 2019-04-23 NOTE — Telephone Encounter (Signed)
[employer] medicare order sent to [facility]. No auth they will reach out the patient to schedule.

## 2019-05-06 ENCOUNTER — Other Ambulatory Visit: Payer: Self-pay | Admitting: Neurology

## 2019-05-08 ENCOUNTER — Telehealth: Payer: Self-pay | Admitting: Neurology

## 2019-05-08 ENCOUNTER — Other Ambulatory Visit: Payer: Self-pay | Admitting: *Deleted

## 2019-05-08 MED ORDER — TRAMADOL HCL 50 MG PO TABS: 50.0000 mg | ORAL_TABLET | Freq: Four times a day (QID) | ORAL | 0 refills | Status: DC | PRN

## 2019-05-08 MED ORDER — TRAMADOL HCL 50 MG PO TABS: 50.0000 mg | ORAL_TABLET | Freq: Three times a day (TID) | ORAL | 1 refills | Status: DC | PRN

## 2019-05-08 NOTE — Telephone Encounter (Signed)
Called and spoke with daughter, Jocelyn Wilson (on [place]). Pt sleeping. Has about 4-5 days and left of tramadol. Wanting a weeks worth sent to local and then a 90 days supply to [facility]. Advised I will have to speak with Dr. Felecia Shelling and call back about this.  Made 4 month f/u for 08/21/19 at 11:30am with Dr. Felecia Shelling

## 2019-05-08 NOTE — Telephone Encounter (Signed)
Spoke with Dr. Felecia Shelling. Ok to send 7 days supply to local pharmacy and 90days supply to [facility].

## 2019-05-08 NOTE — Addendum Note (Signed)
Addended by: Hope Pigeon on: 05/08/2019 12:19 PM   Modules accepted: Orders

## 2019-05-08 NOTE — Addendum Note (Signed)
Addended by: Arlice Colt A on: 05/08/2019 12:30 PM   Modules accepted: Orders

## 2019-05-08 NOTE — Telephone Encounter (Signed)
Pt states she has been told by [facility] she is out of refills on traMADol (ULTRAM) 50 MG tablet, pt states she is almost out.  Pt is asking for a 90 day script.  Pt is also asking for about a weeks worth to be called into [place] [phone]

## 2019-05-09 NOTE — Telephone Encounter (Signed)
Called and spoke with husband, Jocelyn Wilson (on [place]). Informed them we received fax notification from [facility] that they will no longer dispense more than a 30 day supply at one time of tramadol. They have reduced recent rx sent from #[phone] to #[phone]. He verbalized understanding and will let his wife know.

## 2019-05-15 ENCOUNTER — Ambulatory Visit
Admission: RE | Admit: 2019-05-15 | Discharge: 2019-05-15 | Disposition: A | Discharge status: Home / Self Care | Payer: Medicare Other | Source: Ambulatory Visit | Attending: Neurology | Admitting: Neurology

## 2019-05-15 DIAGNOSIS — G35 Multiple sclerosis: Secondary | ICD-10-CM

## 2019-05-16 ENCOUNTER — Ambulatory Visit (INDEPENDENT_AMBULATORY_CARE_PROVIDER_SITE_OTHER): Payer: Medicare Other | Admitting: Neurology

## 2019-05-16 DIAGNOSIS — R0683 Snoring: Secondary | ICD-10-CM

## 2019-05-16 DIAGNOSIS — G4710 Hypersomnia, unspecified: Secondary | ICD-10-CM

## 2019-05-16 DIAGNOSIS — G4719 Other hypersomnia: Secondary | ICD-10-CM

## 2019-05-17 ENCOUNTER — Telehealth: Payer: Self-pay | Admitting: Neurology

## 2019-05-17 NOTE — Telephone Encounter (Signed)
I spoke to Mrs. Kensinger about the MRI.  The area I was concerned represented a rapidly growing meningioma is smaller on the current scan and therefore is more likely to represent inflammation.  I cannot rule out a large gray matter demyelinating plaque.  Either way, the small size implies that this is not an aggressive pathologic lesion.  Her back pain is doing better on tramadol.

## 2019-06-04 ENCOUNTER — Other Ambulatory Visit: Payer: Self-pay | Admitting: Neurology

## 2019-06-04 NOTE — Progress Notes (Signed)
Patient Information     First Name: Jocelyn Last Name: Wilson ID: [id number]  Birth Date: [birth year] Age: 68 Gender: Female  Referring Provider: Aretta Nip, MD BMI: 47.1 (W=218 lb, H=4' 9'')    Epworth:  11/24   Sleep Study Information    Study Date: May 16, 2019 S/H/A Version: 001.001.001.001 / 4.1.1528 / 25  History:    She is a 68 yo woman with MS, snoring and EDS (ESS = 11/24).    Summary & Diagnosis:    1.   No significant OSA with a pAHI = 4.0   Recommendations:     1.   CPAP is not indicated.   The snoring and EDS might improve with weight loss and/or an oral appliance  Richard A. Felecia Shelling, MD, PhD, FAAN Certified in Neurology, Clinical Neurophysiology, Sleep Medicine, Pain Medicine and Neuroimaging Director, [facility] [address], [place], [place] [address] [phone]      Sleep Summary  Oxygen Saturation Statistics   Start Study Time: End Study Time: Total Recording Time:  11:55:21 PM  7:31:28 AM  7 h, 36 min  Total Sleep Time % REM of Sleep Time:  6 h, 48 min  19.4    Mean: 95 Minimum: 86 Maximum: 98  Mean of Desaturations Nadirs (%):   90  Oxygen Desaturation. %:   4-9 10-20 >20 Total  Events Number Total    6  2 75.0 25.0  0 0.0  8 100.0  Oxygen Saturation: <90 <=88 <85 <80 <70  Duration (minutes): Sleep % 0.1 0.0  0.1 0.0  0.0 0.0 0.0 0.0 0.0 0.0     Respiratory Indices      Total Events REM NREM All Night  pRDI:  41 pAHI:  27 ODI:  8  pAHIc:  1 % CSR: 0.0 8.4 7.6 2.3 0.9 5.6 3.1 0.9 0.0 6.1 4.0 1.2 0.2       Pulse Rate Statistics during Sleep (BPM)      Mean: 74 Minimum: 54 Maximum: 101     Indices are calculated using technically valid sleep time of  6 hrs, 43 min. Central-Indices are calculated using technically valid sleep time of  6  hrs, 33 min. pRDI/pAHI are calculated using oxi desaturations ? 3%

## 2019-07-17 ENCOUNTER — Other Ambulatory Visit: Payer: Self-pay | Admitting: Neurology

## 2019-08-21 ENCOUNTER — Ambulatory Visit: Payer: Self-pay | Admitting: Neurology

## 2019-08-21 ENCOUNTER — Other Ambulatory Visit: Payer: Self-pay

## 2019-08-21 ENCOUNTER — Encounter: Payer: Self-pay | Admitting: Neurology

## 2019-08-21 ENCOUNTER — Ambulatory Visit: Payer: Medicare Other | Admitting: Neurology

## 2019-08-21 VITALS — BP 128/78 | HR 97 | Temp 97.7°F | Ht <= 58 in | Wt 219.0 lb

## 2019-08-21 DIAGNOSIS — F329 Major depressive disorder, single episode, unspecified: Secondary | ICD-10-CM | POA: Diagnosis not present

## 2019-08-21 DIAGNOSIS — G35 Multiple sclerosis: Secondary | ICD-10-CM | POA: Diagnosis not present

## 2019-08-21 DIAGNOSIS — F32A Depression, unspecified: Secondary | ICD-10-CM

## 2019-08-21 DIAGNOSIS — G43009 Migraine without aura, not intractable, without status migrainosus: Secondary | ICD-10-CM | POA: Diagnosis not present

## 2019-08-21 DIAGNOSIS — M47817 Spondylosis without myelopathy or radiculopathy, lumbosacral region: Secondary | ICD-10-CM

## 2019-08-21 DIAGNOSIS — F411 Generalized anxiety disorder: Secondary | ICD-10-CM

## 2019-08-21 MED ORDER — ARIPIPRAZOLE 2 MG PO TABS: ORAL_TABLET | ORAL | 5 refills | Status: DC

## 2019-08-21 NOTE — Progress Notes (Signed)
[facility]  PATIENT: Jocelyn Wilson DOB: 1951-06-30  REFERRING DOCTOR OR PCP:  Adaku Nnodi SOURCE: Patient  _________________________________   HISTORICAL  CHIEF COMPLAINT:  Chief Complaint  Patient presents with  . Follow-up    RM 12 with husband. Pt reports she is more anxious, more unstable while walking. Reports she has blind spot in right eye when she gets more stressed within the last week. She has hx of right optic neuritis.   . Gait Problem    Ambulates with cane.   . Multiple Sclerosis    Off DMT.  . Back Pain    Takes tramadol prn    HISTORY OF PRESENT ILLNESS:  Jocelyn Wilson is a 68 y.o. woman with multiple sclerosis, back pain (facet syndrome), and migraines.  Update 08/21/19: Her MS is stable off Betaseron x 1+ years.  She had right eye ON as her first symptom..  For a short period of time, she had some OD visual changes.   She notes a dull pain in the right eye, not worse with movements.   Colors are symmetric.   She notes she is doing a lot of puzzles and I-pad use so feels she is straning her eyes.    She is feeling more unsteady.  When she stands up she gets more LBP.   She also has a lot of knee pain.  She was to have lumbar surgery in March but due to Covid-19 it was canceled.  She is having a lot of anxiety and is on Buspar 30 mg po tid and sertraline 150 mg daily   Update 04/19/2019 (virtual): She saw Dr. Annette Stable and was scheduled for surgery.   However, she had her Neurosurgery cancelled twice due to Covid-19.   She reports Dr. Radene Ou is very concerned about her risk of infection with surgery.   For pain, she is on tramadol 50 mg but rarely takes it.   We discussed trying tid to see if more benefit.  Her MS seems stable,   She is off Betaseron and has had no exacerbations or new symptosm.  Gait is affected mostly by LBP.   No new weakness, numbness or visual change.    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    The MS should not add significant risk and she is not on any immunomodulatory therapy.    I personally reviewed her MRI lumbar and brain from 11/03/2018 and xray from 11/13/2018: 11/03/18  MRI of the lumbar spine shows that there has been significant changes at L3-L4 noted on the current MRI that have progressed compared to the 2017 MRI.  Specifically, she now has spinal stenosis at L3-L4 with an AP diameter of 8.2 mm.  That diameter was 10.8 mm in 2017.  Additionally, there is about 2 mm of anterolisthesis likely due to the facet hypertrophy that has progressed.  I believe it is playing some role in her pain.     11/03/2018 MRI of the brain shows that the MS is mild and stable.  She has a 12-millimeter extra-axial mass in the olfactory groove on the right that was not present on MRI 11/09/2017.  It is hyperintense on T2 and FLAIR images and hypointense on T1-weighted images.  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  I am concerned about the rapid growth from not being visible to 12 mm in just 1 year.  I  recommend that we repeat the MRI.   She has had an allergic reaction to contrast in the past so we would do the study without.  11/13/2018  Lumbar flex/ext  IMPRESSION: Mild dynamic instability at the L3-4 level, up to 5 mm anterolisthesis with patient standing in flexion. This is facet mediated, based on plain film appearance as well as review of MR.  Severe degenerative disease L4-5, near complete loss of interspace Height.  EPWORTH SLEEPINESS SCALE  On a scale of 0 - 3 what is the chance of dozing:  Sitting and Reading:  2   Watching TV:   2 Sitting inactive in a public place: 0 Passenger in car for one hour: 3 Lying down to rest in the afternoon: 3 Sitting and talking to someone: 0 Sitting quietly after lunch:  1 In a car, stopped in traffic:  0  Total (out of 24): 11/24 mild excessive daytime sleepiness.        Update 10/19/2018: She is on Betaseron for her MS and she tolerates it well.   She has not had any recent exacerbations.    Gait is worse and she now needs  She is seeing [facility] for her back.   She saw Dr. Nelva Bush who felt she was not a candidate for further injections.    Her last 2 MBB RFA's did not help.  Dr. Nelva Bush may do a piriformis muscle injection.     She has tramadol but it made her sleepy and she has not taken it recently  She is more depressed and sometimes cries.   She is upset about her pain and physical abilities.    She is on Zoloft 100 mg, trazodone 100 mg (qHS) and buspar 30 mg bid.     Update 04/18/2018: She is noting more difficulty with her balance.   Her ankle gives out some and she has a couple falls breaking her foot.    Mri brain 10/2017 showed no new lesions.     Her last RFA ablation in 2017 did not help.   The first one she had with me helped but a second one did not help much.     Update 10/19/2017: She feels her MS remains stable on Betaseron. She tolerates it well and has not had any exacerbations in many years.    Her last MRI of the brain was 11/12/2015 and it shows foci in the brainstem and hemispheres consistent with MS. There was no change with compared to an MRI from 2016. Unfortunately she is having difficulty with her co-pay and she is not eligible for patient assistance.      We had a long discussion about her options (see plan) for a DMT.  She notes that her gait is stiff when she begins to walk after sitting for a while. She denies any change in strength and sensation. She notes no change in bladder function. Vision is fine.    She sleeps well with her current medications. She has restless leg syndrome that is helped by the medications. She still notes some depression.   Headaches are doing well  Back pain is doing about the same as last visit and better than 2 years ago.   She received a lot of benefit from radiofrequency ablation of the medial  branch nerves   From 06/16/2016: Her LBP is severe at times.   Pain is mostly in the right > left buttock, and across the lower back.   Pain sometimes will radiate  to the proximal leg but never down towards the foot    MRI of the lumbar spine shows severe degenerative disc disease and facet hypertrophy at L4-L5.   She has had radiofrequency ablations of the medial branch nerves with benefit. She also has had ESI's, SI joint injections, piriformis muscles injections with less benefit.   She had RFA of the medial branch nerves 08/2014 with some benefit and got much more benefit after also doing PT 11/15 - 1/16.     She felt better for one month after a bilateral piriformis trigger point injection at the last visit.  Second shot did not help as much.    She will be having RFA by Dr. Jola Baptist soon.    MS:   Her MS has been stable. She denies any recent exacerbations. She continues on Betaseron and tolerates it well. MRI 2017 has shown periventricular white matter foci consistent with multiple sclerosis. When compared to prior MRI dated 08/25/2009, there were no new lesions. The older scan also showed a small left medullary focus that was not observed on the more recent study.      She is on Betaseron.   She denies any new changes with gait, strength or sensation. She denies any significant vision or bladder problems.    Dysesthesias:   She has painful dysesthesia in her shins and feet at times.     Sleep:   She has RLS day and night but worse at night.   Ropinirole greatly helps RLS and she now takes qid.    She gets slightly sleepy at times.     Mood:  She has a chronic mild depression and often feels sad with occasional crying. She takes sertraline, BuSpar and alprazolam. She feels mood is better this visit than it was last visit.     Migraine headaches:  This is generally doing well.    MS History:   Her MS was diagnosed around 2004.    She was actually seeing me for migraine headache when she presented  with optic neuritis and vertigo and had another MRI.   She had interval development of multiple new foci and underwent a lumbar puncture.  CSF was also consistent with MS.   She was placed on Betaseron and continues on that medication with good control of the MS. No definite exacerbations.    REVIEW OF SYSTEMS: Constitutional: No fevers, chills, sweats, or change in appetite Eyes: No visual changes, double vision, eye pain Ear, nose and throat: No hearing loss, ear pain, nasal congestion, sore throat Cardiovascular: No chest pain, palpitations Respiratory: No shortness of breath at rest or with exertion.   No wheezes GastrointestinaI: No nausea, vomiting, diarrhea, abdominal pain, fecal incontinence Genitourinary: No dysuria, urinary retention or frequency.  No nocturia. Musculoskeletal: No neck pain, back pain Integumentary: No rash, pruritus, skin lesions Neurological: as above Psychiatric: No depression at this time.  No anxiety Endocrine: No palpitations, diaphoresis, change in appetite, change in weigh or increased thirst Hematologic/Lymphatic: No anemia, purpura, petechiae. Allergic/Immunologic: No itchy/runny eyes, nasal congestion, recent allergic reactions, rashes  ALLERGIES: Allergies  Allergen Reactions  . Iron Anaphylaxis    Iv iron  . Ferrous Gluconate   . Ciprofloxacin Nausea Only  . Contrast Media [Iodinated Diagnostic Agents] Itching and Rash  . Latex Rash  . Sulfa Antibiotics Itching and Rash  . Tape Rash    Adhesive tape    HOME MEDICATIONS:  Current Outpatient Medications:  .  atorvastatin (LIPITOR) 20 MG tablet,  Take 20 mg by mouth daily., Disp: , Rfl:  .  busPIRone (BUSPAR) 30 MG tablet, TAKE 1 TABLET BY MOUTH 3  TIMES DAILY, Disp: 270 tablet, Rfl: 1 .  empagliflozin (JARDIANCE) 25 MG TABS tablet, Take 25 mg by mouth daily., Disp: , Rfl:  .  esomeprazole (NEXIUM) 40 MG capsule, TAKE 1 CAPSULE BY MOUTH TWO TIMES DAILY, Disp: 180 capsule, Rfl: 1 .   ferrous sulfate 325 (65 FE) MG tablet, Take 325 mg by mouth daily. , Disp: , Rfl:  .  Ibuprofen (ADVIL) 200 MG CAPS, Take 400 mg by mouth 3 (three) times daily as needed (headache or migraine). , Disp: , Rfl:  .  Insulin Pen Needle (BD PEN NEEDLE NANO U/F) 32G X 4 MM MISC, 1 Package by Does not apply route 2 (two) times daily., Disp: 100 each, Rfl: 0 .  levothyroxine (SYNTHROID, LEVOTHROID) 100 MCG tablet, Take 100 mcg by mouth daily before breakfast. , Disp: , Rfl: 0 .  liraglutide (VICTOZA) 18 MG/3ML SOPN, ADMINISTER 0.6 MG UNDER THE SKIN EVERY MORNING (Patient taking differently: Inject 0.6 mg into the skin daily with breakfast. ), Disp: 3 mL, Rfl: 0 .  meclizine (ANTIVERT) 12.5 MG tablet, TAKE 1 TABLET(12.5 MG) BY MOUTH THREE TIMES DAILY AS NEEDED FOR DIZZINESS (Patient taking differently: Take 12.5 mg by mouth 3 (three) times daily as needed for dizziness. TAKE 1 TABLET(12.5 MG) BY MOUTH THREE TIMES DAILY AS NEEDED FOR DIZZINESS), Disp: 270 tablet, Rfl: 1 .  metFORMIN (GLUCOPHAGE) 500 MG tablet, Take 1 tablet (500 mg total) by mouth 2 (two) times daily with a meal., Disp: 60 tablet, Rfl: 0 .  metoprolol succinate (TOPROL-XL) 50 MG 24 hr tablet, TAKE 1 TABLET BY MOUTH  DAILY (Patient taking differently: Take 50 mg by mouth daily. ), Disp: 90 tablet, Rfl: 3 .  ONE TOUCH ULTRA TEST test strip, USE TO CHECK FASTING AND PRE-DINNER BLOOD GLUCOSE BID, Disp: , Rfl: 12 .  ONETOUCH DELICA LANCETS [id number] MISC, USE TO CHECK FASTING AND PRE-DINNER BLOOD GLUCOSE BID, Disp: , Rfl: 12 .  potassium citrate (UROCIT-K) 10 MEQ (1080 MG) SR tablet, Take 20 mEq by mouth 3 (three) times daily with meals. , Disp: , Rfl:  .  rOPINIRole (REQUIP) 1 MG tablet, TAKE 1 TABLET BY MOUTH 4  TIMES DAILY, Disp: 360 tablet, Rfl: 3 .  sertraline (ZOLOFT) 100 MG tablet, TAKE 1 AND 1/2 TABLETS BY  MOUTH DAILY, Disp: 135 tablet, Rfl: 3 .  traMADol (ULTRAM) 50 MG tablet, Take 1 tablet (50 mg total) by mouth 3 (three) times daily as needed.,  Disp: 270 tablet, Rfl: 1 .  traZODone (DESYREL) 100 MG tablet, TAKE 1/2 TO 1 TABLET BY  MOUTH NIGHTLY FOR SLEEP, Disp: 90 tablet, Rfl: 3 .  vitamin B-12 (CYANOCOBALAMIN) 100 MCG tablet, Take 100 mcg by mouth daily., Disp: , Rfl:  .  VITAMIN D PO, Take 2,000 Units by mouth daily. , Disp: , Rfl:  .  ARIPiprazole (ABILIFY) 2 MG tablet, One or two po daily, Disp: 60 tablet, Rfl: 5  PAST MEDICAL HISTORY: Past Medical History:  Diagnosis Date  . Allergy   . Anxiety   . Asthma   . Back pain   . Chronic kidney disease    KIDNEY STONES WITH STENTS  . Depression   . Diabetes ([place])   . Diverticula of colon   . Dyspnea   . Eczema   . Food allergy   . Gallbladder problem   . GERD (  gastroesophageal reflux disease)   . History of kidney stones   . Hypercholesterolemia   . Hyperlipidemia   . Hypothyroidism   . Joint pain   . Kidney stones   . Leg edema   . Migraine headache   . Multiple sclerosis ([place])   . Obesity   . Palpitations   . PONV (postoperative nausea and vomiting)   . Restless leg syndrome   . Right sided sciatica 10/29/2015  . Snoring   . Thyroid disease    hyperthyroidism  . Unsteady gait   . Vertigo   . Vision abnormalities     PAST SURGICAL HISTORY: Past Surgical History:  Procedure Laterality Date  . BREAST SURGERY     AUGMENTATION  . carpel tunnel    . CHOLECYSTECTOMY    . DILATION AND CURETTAGE OF UTERUS  1983  . FINGER SURGERY  x8  . HERNIA REPAIR    . LITHOTRIPSY    . NISSEN FUNDOPLICATION    . VENTRAL HERNIA REPAIR  07/25/2012   Procedure: LAPAROSCOPIC VENTRAL HERNIA;  Surgeon: Adin Hector, MD;  Location: [facility];  Service: General;  Laterality: N/A;    FAMILY HISTORY: Family History  Problem Relation Age of Onset  . Hyperlipidemia Mother   . Glaucoma Mother   . Hypertension Mother   . Anxiety disorder Mother   . Depression Mother   . Heart disease Father   . Cancer Father        prostate  . Diabetes Father   . Dementia Father   .  Hypertension Father   . Hyperlipidemia Father   . Sleep apnea Father   . Obesity Father   . Cancer Brother        esophageal  . Arthritis Sister        RA    SOCIAL HISTORY:  Social History   Socioeconomic History  . Marital status: Married    Spouse name: Jolita Martins  . Number of children: 1  . Years of education: Not on file  . Highest education level: Not on file  Occupational History  . Occupation: retired  Scientific laboratory technician  . Financial resource strain: Not on file  . Food insecurity    Worry: Not on file    Inability: Not on file  . Transportation needs    Medical: Not on file    Non-medical: Not on file  Tobacco Use  . Smoking status: Former Research scientist (life sciences)  . Smokeless tobacco: Never Used  Substance and Sexual Activity  . Alcohol use: No  . Drug use: No  . Sexual activity: Not on file  Lifestyle  . Physical activity    Days per week: Not on file    Minutes per session: Not on file  . Stress: Not on file  Relationships  . Social Herbalist on phone: Not on file    Gets together: Not on file    Attends religious service: Not on file    Active member of club or organization: Not on file    Attends meetings of clubs or organizations: Not on file    Relationship status: Not on file  . Intimate partner violence    Fear of current or ex partner: Not on file    Emotionally abused: Not on file    Physically abused: Not on file    Forced sexual activity: Not on file  Other Topics Concern  . Not on file  Social History Narrative  . Not on  file     PHYSICAL EXAM  Vitals:   08/21/19 1434  BP: 128/78  Pulse: 97  Temp: 97.7 F (36.5 C)  SpO2: 96%  Weight: 219 lb (99.3 kg)  Height: 4\' 9"  (1.448 m)    Body mass index is 47.39 kg/m.   General: The patient is well-developed and well-nourished and in no acute distress  Musculoskeletal:  Lower lumbar paraspinals are nontender.   She has reduced range of motion in the back.  Neurologic Exam  Mental  status: The patient is alert and oriented x 3 at the time of the examination. The patient has apparent normal recent and remote memory, with an apparently normal attention span and concentration ability.   Speech is normal.  Cranial nerves: Extraocular movements are full.  Facial strength and sensation is normal.  Trapezius strength is normal.  The tongue is midline, and the patient has symmetric elevation of the soft palate. No obvious hearing deficits are noted.  Motor:  Muscle bulk is normal.   Tone is normal. Strength is  5 / 5 in all 4 extremities.   Sensory: Sensory testing is intact to soft touch.     Gait and station: Station is normal.  The gait is arthritic and mildly wide..  Tandem gait is wide.   Romberg negative  Reflexes: Deep tendon reflexes are symmetric and normal bilaterally.     ______________________________________________  Multiple sclerosis ([place])  Migraine without aura and without status migrainosus, not intractable  Anxiety state  Depression, unspecified depression type  Lumbosacral facet joint syndrome  1.  We discussed her lower back.  She would like to hold off on surgery until the risk of COVID-19 is less.  I will refill her tramadol. 2.   Her MS has done well off of Betaseron and she will continue off of a disease modifying therapy. 3.   Stay active and exercise as tolerated. 4.   Low-dose Abilify for anxiety.  She is already on sertraline and BuSpar.   5/   Return in 6 months or sooner if there are new or worsening neurologic symptoms.   Alle Difabio A. Felecia Shelling, MD, PhD [id number], [id number] PM Certified in Neurology, Clinical Neurophysiology, Sleep Medicine, Pain Medicine and Neuroimaging  [facility] [address] [place], [place] [address] [phone]  ;,

## 2019-08-23 ENCOUNTER — Telehealth: Payer: Self-pay | Admitting: *Deleted

## 2019-08-23 NOTE — Telephone Encounter (Signed)
Submitted PA Abilify on CMM. Key: [id number]. Waiting on determination from [facility].

## 2019-08-24 ENCOUNTER — Other Ambulatory Visit: Payer: Self-pay | Admitting: Neurology

## 2019-08-27 NOTE — Telephone Encounter (Addendum)
PA for Abilify was denied. [facility] states denial was made because plan will only approve 1 tablet daily unless medication is being rx'd for generalized anxiety disorder. Will fwd to MD to review and confirm if ok for the pt to take 1 tablet daily.

## 2019-08-27 NOTE — Telephone Encounter (Signed)
It is okay for her to take 1 pill a day.

## 2019-08-28 MED ORDER — ARIPIPRAZOLE 2 MG PO TABS: ORAL_TABLET | ORAL | 5 refills | Status: DC

## 2019-08-28 NOTE — Addendum Note (Signed)
Addended by: Verlin Grills T on: 08/28/2019 07:43 AM   Modules accepted: Orders

## 2019-08-28 NOTE — Telephone Encounter (Signed)
Updated rx for one 2 mg abilify rx has been submitted to [facility]. I have advised pharmacy to d/c previous rx.

## 2019-09-23 ENCOUNTER — Other Ambulatory Visit: Payer: Self-pay | Admitting: Neurology

## 2019-09-24 ENCOUNTER — Other Ambulatory Visit: Payer: Self-pay | Admitting: *Deleted

## 2019-09-24 MED ORDER — ARIPIPRAZOLE 2 MG PO TABS: ORAL_TABLET | ORAL | 1 refills | Status: DC

## 2019-11-09 ENCOUNTER — Ambulatory Visit: Payer: Medicare Other

## 2019-11-17 ENCOUNTER — Ambulatory Visit: Payer: Medicare Other | Attending: Internal Medicine

## 2019-11-17 DIAGNOSIS — Z23 Encounter for immunization: Secondary | ICD-10-CM | POA: Insufficient documentation

## 2019-11-17 NOTE — Progress Notes (Signed)
   Covid-19 Vaccination Clinic  Name:  Jocelyn Wilson    MRN: [id number] DOB: 02-08-1951  11/17/2019  [name] was observed post Covid-19 immunization for 15 minutes without incidence. She was provided with Vaccine Information Sheet and instruction to access the V-Safe system.   [name] was instructed to call 911 with any severe reactions post vaccine: Marland Kitchen Difficulty breathing  . Swelling of your face and throat  . A fast heartbeat  . A bad rash all over your body  . Dizziness and weakness    Immunizations Administered    Name Date Dose VIS Date Route   Pfizer COVID-19 Vaccine 11/17/2019  2:55 PM 0.3 mL 09/21/2019 Intramuscular   Manufacturer: [place]   Lot: [id number]   [place]: [id number]

## 2019-11-30 ENCOUNTER — Ambulatory Visit: Payer: Medicare Other

## 2019-12-12 ENCOUNTER — Ambulatory Visit: Payer: Medicare Other | Attending: Internal Medicine

## 2019-12-12 DIAGNOSIS — Z23 Encounter for immunization: Secondary | ICD-10-CM | POA: Insufficient documentation

## 2019-12-12 NOTE — Progress Notes (Signed)
   Covid-19 Vaccination Clinic  Name:  Jocelyn Wilson    MRN: [id number] DOB: [birth year]  12/12/2019  [name] was observed post Covid-19 immunization for 30 minutes based on pre-vaccination screening without incident. She was provided with Vaccine Information Sheet and instruction to access the V-Safe system.   [name] was instructed to call 911 with any severe reactions post vaccine: Marland Kitchen Difficulty breathing  . Swelling of face and throat  . A fast heartbeat  . A bad rash all over body  . Dizziness and weakness   Immunizations Administered    Name Date Dose VIS Date Route   Pfizer COVID-19 Vaccine 12/12/2019 11:08 AM 0.3 mL 09/21/2019 Intramuscular   Manufacturer: [place]   Lot: [id number]   [place]: [id number]

## 2020-02-07 ENCOUNTER — Other Ambulatory Visit: Payer: Self-pay | Admitting: Neurosurgery

## 2020-02-18 NOTE — Pre-Procedure Instructions (Signed)
KEYOSHIA EATMON  02/18/2020     Your procedure is scheduled on Friday, May 14.  Report to [facility], Main Entrance or Entrance "A" at 6:00 AM                     Your surgery or procedure is scheduled for 8:00 A.M.   Call this number if you have problems the morning of surgery: [phone]  This is the number for the Pre- Surgical Desk.                   For any other questions, please call [phone], Monday - Friday 8 AM - 4 PM.   Remember:  Do not eat or drink after midnight Thursday, May 13.  Take these medicines the morning of surgery with A SIP OF WATER:  busPIRone (BUSPAR)             esomeprazole (NEXIUM)              levothyroxine (SYNTHROID)              metoprolol succinate (TOPROL-XL)              rOPINIRole (REQUIP)             meclizine (ANTIVERT)             sertraline (ZOLOFT)              Take if needed:  traMADol Veatrice Bourbon)  STOP taking Aspirin, Aspirin Products (Goody Powder, Excedrin Migraine), Ibuprofen (Advil), Naproxen (Aleve), Vitamins and Herbal Products (ie Fish Oil). Contacts, dentures or bridgework may not be worn into surgery.  Leave your suitcase in the car.  After surgery it may be brought to your room.   WHAT DO I DO ABOUT MY DIABETES MEDICATION?  Marland Kitchen Do not take oral diabetes medicines (pills) the morning of surgery  . The day of surgery, do not take other diabetes injectables, including Byetta (exenatide), Bydureon (exenatide ER), Victoza (liraglutide), or Trulicity (dulaglutide).   How to Manage Your Diabetes Before and After Surgery  Why is it important to control my blood sugar before and after surgery? . Improving blood sugar levels before and after surgery helps healing and can limit problems. . A way of improving blood sugar control is eating a healthy diet by: o  Eating less sugar and carbohydrates o  Increasing activity/exercise o  Talking with your doctor about reaching your blood sugar goals . High blood sugars  (greater than 180 mg/dL) can raise your risk of infections and slow your recovery, so you will need to focus on controlling your diabetes during the weeks before surgery. . Make sure that the doctor who takes care of your diabetes knows about your planned surgery including the date and location.  How do I manage my blood sugar before surgery? . Check your blood sugar at least 4 times a day, starting 2 days before surgery, to make sure that the level is not too high or low. o Check your blood sugar the morning of your surgery when you wake up and every 2 hours until you get to the Short Stay unit. . If your blood sugar is less than 70 mg/dL, you will need to treat for low blood sugar: o Do not take insulin. o Treat a low blood sugar (less than 70 mg/dL) with  cup of clear juice (cranberry or apple), 4 glucose tablets, OR glucose gel. Recheck blood sugar  in 15 minutes after treatment (to make sure it is greater than 70 mg/dL). If your blood sugar is not greater than 70 mg/dL on recheck, call [phone] for further instructions. . Report your blood sugar to the short stay nurse when you get to Short Stay.  . If you are admitted to the hospital after surgery: o Your blood sugar will be checked by the staff and you will probably be given insulin after surgery (instead of oral diabetes medicines) to make sure you have good blood sugar levels. o The goal for blood sugar control after surgery is 80-180 mg/dL.  Special instructions:   [facility]- Preparing For Surgery  Before surgery, you can play an important role. Because skin is not sterile, your skin needs to be as free of germs as possible. You can reduce the number of germs on your skin by washing with CHG (chlorahexidine gluconate) Soap before surgery.  CHG is an antiseptic cleaner which kills germs and bonds with the skin to continue killing germs even after washing.    Oral Hygiene is also important to reduce your risk of infection.   Remember - BRUSH YOUR TEETH THE MORNING OF SURGERY WITH YOUR REGULAR TOOTHPASTE  Please do not use if you have an allergy to CHG or antibacterial soaps. If your skin becomes reddened/irritated stop using the CHG.  Do not shave (including legs and underarms) for at least 48 hours prior to first CHG shower. It is OK to shave your face.  Please follow these instructions carefully.   1. Shower the NIGHT BEFORE SURGERY and the MORNING OF SURGERY with CHG.   2. If you chose to wash your hair, wash your hair first as usual with your normal shampoo.  3. After you shampoo, wash your face and private area with the soap you use at home, then rinse your hair and body thoroughly to remove the shampoo and soap.  4. Use CHG as you would any other liquid soap. You can apply CHG directly to the skin and wash gently with a scrungie or a clean washcloth.   5. Apply the CHG Soap to your body ONLY FROM THE NECK DOWN.  Do not use on open wounds or open sores. Avoid contact with your eyes, ears, mouth and genitals (private parts).   6. Wash thoroughly, paying special attention to the area where your surgery will be performed.  7. Thoroughly rinse your body with warm water from the neck down.  8. DO NOT shower/wash with your normal soap after using and rinsing off the CHG Soap.  9. Pat yourself dry with a CLEAN TOWEL.  10. Wear CLEAN PAJAMAS to bed the night before surgery, wear comfortable clothes the morning of surgery  11. Place CLEAN SHEETS on your bed the night of your first shower and DO NOT SLEEP WITH PETS.  Day of Surgery: Shower as instructed above. Do not wear lotions, powders, or perfumes, or deodorant. Please wear clean clothes to the hospital/surgery center.   Remember to brush your teeth WITH YOUR REGULAR TOOTHPASTE               Do not wear jewelry, make-up or nail polish.  Do not wear lotions, powders, or perfumes, or deodorant.  Do not shave 48 hours prior to surgery.    Do not bring  valuables to the hospital.Contacts, dentures or bridgework may not be worn into surgery.  Leave your suitcase in the car.  After surgery it may be brought to your  room  [facility] is not responsible for any belongings or valuables. For patients admitted to the hospital, discharge time will be determined by your treatment team.  Patients discharged the day of surgery will not be allowed to drive home.   Please read over the following fact sheets that you were given: Coughing and Deep Breathing, Pain Booklet and Surgical Site Infections

## 2020-02-19 ENCOUNTER — Other Ambulatory Visit: Payer: Self-pay

## 2020-02-19 ENCOUNTER — Other Ambulatory Visit (HOSPITAL_COMMUNITY)
Admission: RE | Admit: 2020-02-19 | Discharge: 2020-02-19 | Disposition: A | Discharge status: Home / Self Care | Payer: Medicare Other | Source: Ambulatory Visit | Attending: Neurosurgery | Admitting: Neurosurgery

## 2020-02-19 ENCOUNTER — Encounter (HOSPITAL_COMMUNITY): Payer: Self-pay

## 2020-02-19 ENCOUNTER — Encounter (HOSPITAL_COMMUNITY)
Admission: RE | Admit: 2020-02-19 | Discharge: 2020-02-19 | Disposition: A | Discharge status: Home / Self Care | Payer: Medicare Other | Source: Ambulatory Visit | Attending: Neurosurgery | Admitting: Neurosurgery

## 2020-02-19 ENCOUNTER — Other Ambulatory Visit: Payer: Self-pay | Admitting: Neurosurgery

## 2020-02-19 DIAGNOSIS — K219 Gastro-esophageal reflux disease without esophagitis: Secondary | ICD-10-CM | POA: Insufficient documentation

## 2020-02-19 DIAGNOSIS — Z7984 Long term (current) use of oral hypoglycemic drugs: Secondary | ICD-10-CM | POA: Diagnosis not present

## 2020-02-19 DIAGNOSIS — Z01818 Encounter for other preprocedural examination: Secondary | ICD-10-CM | POA: Insufficient documentation

## 2020-02-19 DIAGNOSIS — G2581 Restless legs syndrome: Secondary | ICD-10-CM | POA: Insufficient documentation

## 2020-02-19 DIAGNOSIS — R9431 Abnormal electrocardiogram [ECG] [EKG]: Secondary | ICD-10-CM | POA: Insufficient documentation

## 2020-02-19 DIAGNOSIS — E1122 Type 2 diabetes mellitus with diabetic chronic kidney disease: Secondary | ICD-10-CM | POA: Diagnosis not present

## 2020-02-19 DIAGNOSIS — G35 Multiple sclerosis: Secondary | ICD-10-CM | POA: Diagnosis not present

## 2020-02-19 DIAGNOSIS — F329 Major depressive disorder, single episode, unspecified: Secondary | ICD-10-CM | POA: Diagnosis not present

## 2020-02-19 DIAGNOSIS — F419 Anxiety disorder, unspecified: Secondary | ICD-10-CM | POA: Insufficient documentation

## 2020-02-19 DIAGNOSIS — Z20822 Contact with and (suspected) exposure to covid-19: Secondary | ICD-10-CM | POA: Insufficient documentation

## 2020-02-19 DIAGNOSIS — E669 Obesity, unspecified: Secondary | ICD-10-CM | POA: Diagnosis not present

## 2020-02-19 DIAGNOSIS — Z87891 Personal history of nicotine dependence: Secondary | ICD-10-CM | POA: Diagnosis not present

## 2020-02-19 DIAGNOSIS — Z87442 Personal history of urinary calculi: Secondary | ICD-10-CM | POA: Insufficient documentation

## 2020-02-19 DIAGNOSIS — Z6841 Body Mass Index (BMI) 40.0 and over, adult: Secondary | ICD-10-CM | POA: Diagnosis not present

## 2020-02-19 DIAGNOSIS — E039 Hypothyroidism, unspecified: Secondary | ICD-10-CM | POA: Insufficient documentation

## 2020-02-19 DIAGNOSIS — Z79890 Hormone replacement therapy (postmenopausal): Secondary | ICD-10-CM | POA: Insufficient documentation

## 2020-02-19 DIAGNOSIS — E7800 Pure hypercholesterolemia, unspecified: Secondary | ICD-10-CM | POA: Diagnosis not present

## 2020-02-19 DIAGNOSIS — N189 Chronic kidney disease, unspecified: Secondary | ICD-10-CM | POA: Diagnosis not present

## 2020-02-19 DIAGNOSIS — Z79899 Other long term (current) drug therapy: Secondary | ICD-10-CM | POA: Diagnosis not present

## 2020-02-19 HISTORY — DX: Unspecified osteoarthritis, unspecified site: M19.90

## 2020-02-19 LAB — CBC WITH DIFFERENTIAL/PLATELET
Abs Immature Granulocytes: 0.1 10*3/uL — ABNORMAL HIGH (ref 0.00–0.07)
Basophils Absolute: 0.1 10*3/uL (ref 0.0–0.1)
Basophils Relative: 1 %
Eosinophils Absolute: 0.2 10*3/uL (ref 0.0–0.5)
Eosinophils Relative: 2 %
HCT: 44.9 % (ref 36.0–46.0)
Hemoglobin: 13.6 g/dL (ref 12.0–15.0)
Immature Granulocytes: 1 %
Lymphocytes Relative: 17 %
Lymphs Abs: 1.3 10*3/uL (ref 0.7–4.0)
MCH: 28.1 pg (ref 26.0–34.0)
MCHC: 30.3 g/dL (ref 30.0–36.0)
MCV: 92.8 fL (ref 80.0–100.0)
Monocytes Absolute: 0.6 10*3/uL (ref 0.1–1.0)
Monocytes Relative: 8 %
Neutro Abs: 5.2 10*3/uL (ref 1.7–7.7)
Neutrophils Relative %: 71 %
Platelets: 317 10*3/uL (ref 150–400)
RBC: 4.84 MIL/uL (ref 3.87–5.11)
RDW: 14.2 % (ref 11.5–15.5)
WBC: 7.4 10*3/uL (ref 4.0–10.5)
nRBC: 0 % (ref 0.0–0.2)

## 2020-02-19 LAB — SARS CORONAVIRUS 2 (TAT 6-24 HRS): SARS Coronavirus 2: NEGATIVE

## 2020-02-19 LAB — HEMOGLOBIN A1C
Hgb A1c MFr Bld: 6.7 % — ABNORMAL HIGH (ref 4.8–5.6)
Mean Plasma Glucose: 145.59 mg/dL

## 2020-02-19 LAB — BASIC METABOLIC PANEL
Anion gap: 11 (ref 5–15)
BUN: 15 mg/dL (ref 8–23)
CO2: 26 mmol/L (ref 22–32)
Calcium: 9.3 mg/dL (ref 8.9–10.3)
Chloride: 102 mmol/L (ref 98–111)
Creatinine, Ser: 0.69 mg/dL (ref 0.44–1.00)
GFR calc Af Amer: >60 mL/min (ref >60)
GFR calc non Af Amer: >60 mL/min (ref >60)
Glucose, Bld: 156 mg/dL — ABNORMAL HIGH (ref 70–99)
Potassium: 4 mmol/L (ref 3.5–5.1)
Sodium: 139 mmol/L (ref 135–145)

## 2020-02-19 LAB — TYPE AND SCREEN
ABO/RH(D): O NEG
Antibody Screen: NEGATIVE

## 2020-02-19 LAB — SURGICAL PCR SCREEN
MRSA, PCR: NEGATIVE
Staphylococcus aureus: NEGATIVE

## 2020-02-19 LAB — GLUCOSE, CAPILLARY: Glucose-Capillary: 181 mg/dL — ABNORMAL HIGH (ref 70–99)

## 2020-02-19 NOTE — Progress Notes (Addendum)
PCP - Dr. Milagros Evener  Cardiologist - Dr. Beau Fanny  Chest x-ray -   EKG - 02/19/2020  Stress Test -   ECHO -   Cardiac Cath -  Sleep Study -  CPAP -   LABS-CBC  With diff, T/S, BMP, EKG, PCR  ASA-na  ERAS-no  HA1C-6.3 02/19/2020 Fasting Blood Sugar - 120'or less_ times a day  Anesthesia- Please view EKG Pt denies having chest pain, sob, or fever at this time. All instructions explained to the pt, with a verbal understanding of the material. Pt agrees to go over the instructions while at home for a better understanding. Pt also instructed to self quarantine after being tested for COVID-19. The opportunity to ask questions was provided.

## 2020-02-20 ENCOUNTER — Ambulatory Visit: Payer: Medicare Other | Admitting: Neurology

## 2020-02-20 NOTE — Progress Notes (Signed)
Anesthesia Chart Review:  Case: [id number] Date/Time: 02/22/20 0745   Procedure: XLIF - L3-L4 with perc pedicle screws (Left )   Anesthesia type: General   Pre-op diagnosis: Spondylolisthesis   Location: [facility] OR ROOM 18 / [facility]   Surgeons: Earnie Larsson, MD      DISCUSSION: Patient is a 69 year old female scheduled for the above procedure.  Surgery was initially scheduled for 01/02/19, but cancelled due to COVID-19 pandemic. I believe it was also rescheduled from last summer as well.   History includes former smoker (quit 10/11/76), post-operative N/V, DM2, GERD, Multiple Sclerosis (diagnosed ~ 2004), HLD, anxiety, nephrolithiasis, hyper/hypothyroidism (now on levothyroxine), RLS, hypercholesterolemia, palpitations (SVT 2008), back pain, edema, migraines, exertional dyspnea, vertigo, snoring ("minimal sleep apnea" on 05/2019 home sleep study), ventral hernia repair (for multiple incarcerated ventral incisional hernias, 07/25/12). BMI is consistent with morbid obesity.  She was last seen by cardiology on 12-30-2018 by Jory Sims, DNP for preoperative evaluation. At that time,  patient without chest pain, tachycardia, or fatigue but known chronic DOE and limited activity due to MS and chronic back pain. She wrote, "...based on ACC/AHA guidelines, Jocelyn Wilson would be at acceptable risk for the planned procedure without further cardiovascular testing.  Recommend follow up echocardiogram on next visit, but this will not delay scheduled procedure."  Patient has not seen cardiology since this visit. Her 02/19/20 EKG showed anterolateral T wave inversion which is similar to 2018-12-30 tracing, but more pronounced then 05/22/2017 EKG.   I reviewed available information with anesthesiologist Albertha Ghee, MD with recommendations to follow-up with patient to verify that she has not had any new or progressive CV symptoms since her last cardiology visit. I spoke with her. She reports that she has not had an cardiac issues  other than the run of SVT in 2008. She denied chest pain, palpitations, edema, and SOB at rest. She has stable DOE with activities such as climbing stairs. She remains limited in activity primarily due to her back and knee pain (and to a lesser degree her MS). She walks independently in her home, but uses a cane otherwise or a motorized cart when at a grocery store.  She reports issues with anxiety, but denied new fatigue, change in activity tolerance, or new or progressive dyspnea. She sleeps in a chair for "years" due to GERD from reported lower esophageal sphincter dysfunction.    S/p second [place] COVID-19 vaccine 12/12/19. 02/19/2020 presurgical COVID-19 test negative. She denied any new or progressive CV symptoms since her cardiology clearance in March 2020. She has no known CAD history. Her EKG appears stable. Based on my conversation with Dr. Marcie Bal and with patient, I would anticipate that she can proceed as planned if no acute changes. She had significant post-operative N/V around the early 1990's for breast reduction surgery in [place] and wants to make sure anesthesia team is aware so measures can be taken to reduce this risk.  Her signed anesthesiologist will evaluate on the day of surgery and discuss definitive anesthesia plan.   VS: BP (!) 148/83   Pulse 91   Temp 36.5 C (Oral)   Resp 20   Ht 4\' 9"  (1.448 m)   Wt 102.3 kg   SpO2 98%   BMI 48.80 kg/m    PROVIDERS: Rankins, Bill Salinas, MD his PCP - Arlice Colt, MD is neurologist. Last evaluation 08/21/19.  MS stable off Betaseron.  She felt gait was more unsteady due to low back pain. Larae Grooms, MD is  cardiologist.  Seen in 2016 for palpitations with history of SVT on 2008 monitor.  LVEF then was normal.  At 2016 visit symptoms well controlled with metoprolol.  Most recently had reevaluation by Jory Sims, DNP on 12/18/2018 for preoperative evaluation.    LABS: Labs reviewed: Acceptable for surgery. (all labs  ordered are listed, but only abnormal results are displayed)  Labs Reviewed  GLUCOSE, CAPILLARY - Abnormal; Notable for the following components:      Result Value   Glucose-Capillary 181 (*)    All other components within normal limits  CBC WITH DIFFERENTIAL/PLATELET - Abnormal; Notable for the following components:   Abs Immature Granulocytes 0.10 (*)    All other components within normal limits  BASIC METABOLIC PANEL - Abnormal; Notable for the following components:   Glucose, Bld 156 (*)    All other components within normal limits  HEMOGLOBIN A1C - Abnormal; Notable for the following components:   Hgb A1c MFr Bld 6.7 (*)    All other components within normal limits  SURGICAL PCR SCREEN  TYPE AND SCREEN    Home Sleep Test 06/06/19: Per patient message from Dr. Felecia Shelling, "It shows minimal sleep apnea (only 4 per hour).  That's not severe enough to consider CPAP.   Therefore we would recommend weight loss and to consider an oral appliance.    If interested in an oral appliance let us know.  There are several dentists in town who have experience with OSA and can make custom appliances."   IMAGES: MRI Brain 05/15/19: IMPRESSION:  1.   There are multiple T2/flair hyperintense foci in the hemispheres and brainstem in a pattern and configuration consistent with chronic demyelinating plaque associated with multiple sclerosis.  None of the foci appears to be acute and there are no new lesions compared to the 11/03/2018 MRI. 2.    The right subfrontal focus seen on the 11/03/2018 MRI and concerning for meningioma is smaller on the current study.  This makes meningioma less likely.  It might possibly have represented a demyelinating plaque. 3.    There are no acute findings.  MRI L-spine 11/03/18: IMPRESSION:  1.    At L3-L4, there is mild spinal stenosis due to 2 mm anterolisthesis, disc bulging, facet hypertrophy and ligamenta flava hypertrophy.  There is mild to moderate bilateral foraminal and  lateral recess stenosis.  There does not appear to be nerve root compression.   The changes at this level have progressed compared to the 2017 MRI 2.    At L4-L5, there is severe loss of disc height and endplate spurring and mild facet hypertrophy.  There is moderate bilateral foraminal narrowing that has progressed compared to the previous MRI.  There is no definite nerve root compression. 3.    At L5-S1, there is a central disc protrusion and mild facet hypertrophy causing moderate right lateral recess stenosis and mild foraminal narrowing and left lateral recess stenosis.  There does not appear to be definite nerve root compression.  The degenerative changes have slightly progressed compared to the previous MRI.   EKG: 02/19/20:  Normal sinus rhythm T wave abnormality, consider anterolateral ischemia No previous tracing Confirmed by Croitoru, Mihai [phone]) on 02/20/2020 10:36:16 AM   CV: Cardiac event monitor 11/28/06-12/27/06:  Summary: Normal sinus rhythm, sinus tachycardia.  One episode of supraventricular tachycardia with a rate of 148 bpm.  It did not appear to be atrial flutter.   Echo 11/22/06 [facility]): Impression: Technically difficult study. Normal left  ventricular function.  Estimated LVEF 60 to 65%. No pathologic valvular abnormalities identified.   Past Medical History:  Diagnosis Date  . Allergy   . Anxiety   . Arthritis   . Asthma    does not use an inhaer- patient does not think she does  . Back pain   . Chronic kidney disease    KIDNEY STONES WITH STENTS  . Depression   . Diabetes ([place])    type II  . Diverticula of colon   . Dyspnea    if climbing steps  . Eczema   . Food allergy   . Gallbladder problem   . GERD (gastroesophageal reflux disease)   . History of kidney stones   . Hypercholesterolemia   . Hyperlipidemia   . Hypothyroidism   . Joint pain   . Kidney stones   . Leg edema   . Migraine headache    with pressure  change  . Multiple sclerosis ([place])   . Obesity   . Palpitations   . PONV (postoperative nausea and vomiting)   . Restless leg syndrome   . Right sided sciatica 10/29/2015  . Snoring   . Thyroid disease    hyperthyroidism  . Unsteady gait   . Vertigo   . Vision abnormalities     Past Surgical History:  Procedure Laterality Date  . BREAST SURGERY     AUGMENTATION  . carpel tunnel Bilateral   . CHOLECYSTECTOMY    . DILATION AND CURETTAGE OF UTERUS  1983  . ESOPHAGOGASTRODUODENOSCOPY    . FINGER SURGERY  x8    trigger finger all except left pinky- right pinky didnt work  . LITHOTRIPSY    . NISSEN FUNDOPLICATION    . VENTRAL HERNIA REPAIR  07/25/2012   Procedure: LAPAROSCOPIC VENTRAL HERNIA;  Surgeon: Adin Hector, MD;  Location: [facility];  Service: General;  Laterality: N/A;    MEDICATIONS: . atorvastatin (LIPITOR) 20 MG tablet  . busPIRone (BUSPAR) 30 MG tablet  . Cyanocobalamin (VITAMIN B12) 1000 MCG TBCR  . esomeprazole (NEXIUM) 40 MG capsule  . ferrous sulfate 325 (65 FE) MG tablet  . Ibuprofen (ADVIL) 200 MG CAPS  . Insulin Pen Needle (BD PEN NEEDLE NANO U/F) 32G X 4 MM MISC  . levothyroxine (SYNTHROID) 137 MCG tablet  . liraglutide (VICTOZA) 18 MG/3ML SOPN  . meclizine (ANTIVERT) 12.5 MG tablet  . metFORMIN (GLUCOPHAGE-XR) 500 MG 24 hr tablet  . metoprolol succinate (TOPROL-XL) 50 MG 24 hr tablet  . ONE TOUCH ULTRA TEST test strip  . ONETOUCH DELICA LANCETS [id number] MISC  . potassium citrate (UROCIT-K) 10 MEQ (1080 MG) SR tablet  . rOPINIRole (REQUIP) 1 MG tablet  . sertraline (ZOLOFT) 100 MG tablet  . traMADol (ULTRAM) 50 MG tablet  . traZODone (DESYREL) 150 MG tablet  . VITAMIN D PO   No current facility-administered medications for this encounter.     Myra Gianotti, PA-C Surgical Short Stay/Anesthesiology [facility] Phone [phone] [facility] Phone [phone] 02/21/2020 11:05 AM

## 2020-02-21 ENCOUNTER — Encounter (HOSPITAL_COMMUNITY): Payer: Self-pay

## 2020-02-21 NOTE — Anesthesia Preprocedure Evaluation (Addendum)
Anesthesia Evaluation  Patient identified by MRN, date of birth, ID band Patient awake    Reviewed: Allergy & Precautions, NPO status , Patient's Chart, lab work & pertinent test results, reviewed documented beta blocker date and time   History of Anesthesia Complications (+) PONV and history of anesthetic complications  Airway Mallampati: III  TM Distance: >3 FB Neck ROM: Full    Dental no notable dental hx.    Pulmonary asthma (controlled) , former smoker,    Pulmonary exam normal breath sounds clear to auscultation       Cardiovascular hypertension, Pt. on medications and Pt. on home beta blockers Normal cardiovascular exam Rhythm:Regular Rate:Normal  ECG: NSR, rate 90   Neuro/Psych  Headaches, PSYCHIATRIC DISORDERS Anxiety Depression Multiple sclerosis   Neuromuscular disease    GI/Hepatic Neg liver ROS, GERD  Medicated and Controlled,  Endo/Other  diabetes, Insulin Dependent, Oral Hypoglycemic AgentsHypothyroidism Morbid obesity  Renal/GU Renal disease     Musculoskeletal negative musculoskeletal ROS (+)   Abdominal (+) + obese,   Peds  Hematology HLD   Anesthesia Other Findings Spondylolisthesis  Reproductive/Obstetrics                           Anesthesia Physical Anesthesia Plan  ASA: III  Anesthesia Plan: General   Post-op Pain Management:    Induction: Intravenous and Rapid sequence  PONV Risk Score and Plan: 3 and Ondansetron, Dexamethasone, Midazolam, Treatment may vary due to age or medical condition and Propofol infusion  Airway Management Planned: Oral ETT  Additional Equipment:   Intra-op Plan:   Post-operative Plan: Extubation in OR  Informed Consent: I have reviewed the patients History and Physical, chart, labs and discussed the procedure including the risks, benefits and alternatives for the proposed anesthesia with the patient or authorized representative  who has indicated his/her understanding and acceptance.     Dental advisory given  Plan Discussed with: CRNA  Anesthesia Plan Comments: (PAT note written by Myra Gianotti, PA-C. Has remote history of severe post-operative N/V and reports sleeping in chair due to GERD from lower esophageal sphincter dysfunction. Marland Kitchen  )     Anesthesia Quick Evaluation

## 2020-02-22 ENCOUNTER — Encounter (HOSPITAL_COMMUNITY): Payer: Self-pay | Admitting: Neurosurgery

## 2020-02-22 ENCOUNTER — Encounter (HOSPITAL_COMMUNITY)
Admission: RE | Disposition: A | Discharge status: Home / Self Care | Payer: Self-pay | Source: Home / Self Care | Attending: Neurosurgery

## 2020-02-22 ENCOUNTER — Observation Stay (HOSPITAL_COMMUNITY)
Admission: RE | Admit: 2020-02-22 | Discharge: 2020-02-23 | Disposition: A | Discharge status: Home / Self Care | Payer: Medicare Other | Attending: Neurosurgery | Admitting: Neurosurgery

## 2020-02-22 ENCOUNTER — Ambulatory Visit (HOSPITAL_COMMUNITY): Payer: Medicare Other

## 2020-02-22 ENCOUNTER — Other Ambulatory Visit: Payer: Self-pay

## 2020-02-22 ENCOUNTER — Ambulatory Visit (HOSPITAL_COMMUNITY): Payer: Medicare Other | Admitting: Vascular Surgery

## 2020-02-22 ENCOUNTER — Ambulatory Visit (HOSPITAL_COMMUNITY): Payer: Medicare Other | Admitting: Anesthesiology

## 2020-02-22 DIAGNOSIS — Z881 Allergy status to other antibiotic agents status: Secondary | ICD-10-CM | POA: Insufficient documentation

## 2020-02-22 DIAGNOSIS — N189 Chronic kidney disease, unspecified: Secondary | ICD-10-CM | POA: Insufficient documentation

## 2020-02-22 DIAGNOSIS — Z419 Encounter for procedure for purposes other than remedying health state, unspecified: Secondary | ICD-10-CM

## 2020-02-22 DIAGNOSIS — E785 Hyperlipidemia, unspecified: Secondary | ICD-10-CM | POA: Diagnosis not present

## 2020-02-22 DIAGNOSIS — Z79899 Other long term (current) drug therapy: Secondary | ICD-10-CM | POA: Diagnosis not present

## 2020-02-22 DIAGNOSIS — Z882 Allergy status to sulfonamides status: Secondary | ICD-10-CM | POA: Insufficient documentation

## 2020-02-22 DIAGNOSIS — E039 Hypothyroidism, unspecified: Secondary | ICD-10-CM | POA: Insufficient documentation

## 2020-02-22 DIAGNOSIS — Z87891 Personal history of nicotine dependence: Secondary | ICD-10-CM | POA: Insufficient documentation

## 2020-02-22 DIAGNOSIS — M4310 Spondylolisthesis, site unspecified: Secondary | ICD-10-CM | POA: Diagnosis present

## 2020-02-22 DIAGNOSIS — Z7984 Long term (current) use of oral hypoglycemic drugs: Secondary | ICD-10-CM | POA: Diagnosis not present

## 2020-02-22 DIAGNOSIS — Z6841 Body Mass Index (BMI) 40.0 and over, adult: Secondary | ICD-10-CM | POA: Diagnosis not present

## 2020-02-22 DIAGNOSIS — G35 Multiple sclerosis: Secondary | ICD-10-CM | POA: Diagnosis not present

## 2020-02-22 DIAGNOSIS — M4316 Spondylolisthesis, lumbar region: Principal | ICD-10-CM | POA: Insufficient documentation

## 2020-02-22 DIAGNOSIS — E1122 Type 2 diabetes mellitus with diabetic chronic kidney disease: Secondary | ICD-10-CM | POA: Insufficient documentation

## 2020-02-22 DIAGNOSIS — M48061 Spinal stenosis, lumbar region without neurogenic claudication: Secondary | ICD-10-CM | POA: Insufficient documentation

## 2020-02-22 DIAGNOSIS — M1990 Unspecified osteoarthritis, unspecified site: Secondary | ICD-10-CM | POA: Diagnosis not present

## 2020-02-22 DIAGNOSIS — I129 Hypertensive chronic kidney disease with stage 1 through stage 4 chronic kidney disease, or unspecified chronic kidney disease: Secondary | ICD-10-CM | POA: Diagnosis not present

## 2020-02-22 DIAGNOSIS — Z888 Allergy status to other drugs, medicaments and biological substances status: Secondary | ICD-10-CM | POA: Diagnosis not present

## 2020-02-22 HISTORY — PX: ANTERIOR LATERAL LUMBAR FUSION WITH PERCUTANEOUS SCREW 1 LEVEL: SHX5553

## 2020-02-22 LAB — GLUCOSE, CAPILLARY
Glucose-Capillary: 113 mg/dL — ABNORMAL HIGH (ref 70–99)
Glucose-Capillary: 167 mg/dL — ABNORMAL HIGH (ref 70–99)
Glucose-Capillary: 273 mg/dL — ABNORMAL HIGH (ref 70–99)
Glucose-Capillary: 232 mg/dL — ABNORMAL HIGH (ref 70–99)

## 2020-02-22 SURGERY — ANTERIOR LATERAL LUMBAR FUSION WITH PERCUTANEOUS SCREW 1 LEVEL
Anesthesia: General | Laterality: Left

## 2020-02-22 MED ORDER — DEXAMETHASONE SODIUM PHOSPHATE 10 MG/ML IJ SOLN: 10.0000 mg | Freq: Once | INTRAMUSCULAR | Status: DC

## 2020-02-22 MED ORDER — SODIUM CHLORIDE 0.9 % IV SOLN: 250.0000 mL | INTRAVENOUS | Status: DC

## 2020-02-22 MED ORDER — FLEET ENEMA 7-19 GM/118ML RE ENEM: 1.0000 | ENEMA | Freq: Once | RECTAL | Status: DC | PRN

## 2020-02-22 MED ORDER — LIDOCAINE 2% (20 MG/ML) 5 ML SYRINGE
INTRAMUSCULAR | Status: DC | PRN
  Administered 2020-02-22: 80 mg via INTRAVENOUS

## 2020-02-22 MED ORDER — SUCCINYLCHOLINE CHLORIDE 20 MG/ML IJ SOLN
INTRAMUSCULAR | Status: DC | PRN
Start: 2020-02-22 — End: 2020-02-22
  Administered 2020-02-22: 100 mg via INTRAVENOUS

## 2020-02-22 MED ORDER — ATORVASTATIN CALCIUM 10 MG PO TABS
20.0000 mg | ORAL_TABLET | Freq: Every day | ORAL | Status: DC
  Administered 2020-02-22: 20 mg via ORAL
  Filled 2020-02-22: qty 2

## 2020-02-22 MED ORDER — SERTRALINE HCL 50 MG PO TABS: 150.0000 mg | ORAL_TABLET | Freq: Every day | ORAL | Status: DC

## 2020-02-22 MED ORDER — METFORMIN HCL ER 500 MG PO TB24
1000.0000 mg | ORAL_TABLET | Freq: Two times a day (BID) | ORAL | Status: DC
  Administered 2020-02-22: 1000 mg via ORAL
  Filled 2020-02-22: qty 2

## 2020-02-22 MED ORDER — VITAMIN B-12 1000 MCG PO TABS: 1000.0000 ug | ORAL_TABLET | Freq: Every day | ORAL | Status: DC

## 2020-02-22 MED ORDER — INSULIN ASPART 100 UNIT/ML ~~LOC~~ SOLN
0.0000 [IU] | Freq: Three times a day (TID) | SUBCUTANEOUS | Status: DC
  Administered 2020-02-22: 11 [IU] via SUBCUTANEOUS
  Administered 2020-02-22: 4 [IU] via SUBCUTANEOUS

## 2020-02-22 MED ORDER — BUPIVACAINE HCL (PF) 0.25 % IJ SOLN
INTRAMUSCULAR | Status: DC | PRN
  Administered 2020-02-22: 20 mL

## 2020-02-22 MED ORDER — CEFAZOLIN SODIUM-DEXTROSE 2-4 GM/100ML-% IV SOLN
2.0000 g | INTRAVENOUS | Status: DC
Start: 2020-02-22 — End: 2020-02-22

## 2020-02-22 MED ORDER — ONDANSETRON HCL 4 MG/2ML IJ SOLN
INTRAMUSCULAR | Status: AC
  Filled 2020-02-22: qty 2

## 2020-02-22 MED ORDER — ONDANSETRON HCL 4 MG PO TABS: 4.0000 mg | ORAL_TABLET | Freq: Four times a day (QID) | ORAL | Status: DC | PRN

## 2020-02-22 MED ORDER — FENTANYL CITRATE (PF) 100 MCG/2ML IJ SOLN
25.0000 ug | INTRAMUSCULAR | Status: DC | PRN
  Administered 2020-02-22 (×2): 50 ug via INTRAVENOUS

## 2020-02-22 MED ORDER — 0.9 % SODIUM CHLORIDE (POUR BTL) OPTIME
TOPICAL | Status: DC | PRN
  Administered 2020-02-22: 1000 mL

## 2020-02-22 MED ORDER — ONDANSETRON HCL 4 MG/2ML IJ SOLN
INTRAMUSCULAR | Status: DC | PRN
  Administered 2020-02-22: 4 mg via INTRAVENOUS

## 2020-02-22 MED ORDER — HYDROMORPHONE HCL 1 MG/ML IJ SOLN
1.0000 mg | INTRAMUSCULAR | Status: DC | PRN
  Administered 2020-02-22: 1 mg via INTRAVENOUS
  Filled 2020-02-22: qty 1

## 2020-02-22 MED ORDER — SODIUM CHLORIDE 0.9% FLUSH: 3.0000 mL | INTRAVENOUS | Status: DC | PRN

## 2020-02-22 MED ORDER — POLYETHYLENE GLYCOL 3350 17 G PO PACK: 17.0000 g | PACK | Freq: Every day | ORAL | Status: DC | PRN

## 2020-02-22 MED ORDER — CHLORHEXIDINE GLUCONATE CLOTH 2 % EX PADS: 6.0000 | MEDICATED_PAD | Freq: Once | CUTANEOUS | Status: DC

## 2020-02-22 MED ORDER — PHENYLEPHRINE HCL-NACL 10-0.9 MG/250ML-% IV SOLN
INTRAVENOUS | Status: DC | PRN
  Administered 2020-02-22: 50 ug/min via INTRAVENOUS

## 2020-02-22 MED ORDER — POTASSIUM CITRATE ER 10 MEQ (1080 MG) PO TBCR
20.0000 meq | EXTENDED_RELEASE_TABLET | Freq: Three times a day (TID) | ORAL | Status: DC
  Filled 2020-02-22 (×3): qty 2

## 2020-02-22 MED ORDER — PROPOFOL 10 MG/ML IV BOLUS
INTRAVENOUS | Status: AC
  Filled 2020-02-22: qty 20

## 2020-02-22 MED ORDER — ACETAMINOPHEN 500 MG PO TABS
ORAL_TABLET | ORAL | Status: AC
  Administered 2020-02-22: 1000 mg
  Filled 2020-02-22: qty 2

## 2020-02-22 MED ORDER — ACETAMINOPHEN 500 MG PO TABS
1000.0000 mg | ORAL_TABLET | Freq: Once | ORAL | Status: AC
  Administered 2020-02-22: 1000 mg via ORAL

## 2020-02-22 MED ORDER — LEVOTHYROXINE SODIUM 137 MCG PO TABS
137.0000 ug | ORAL_TABLET | Freq: Every day | ORAL | Status: DC
  Administered 2020-02-23: 137 ug via ORAL
  Filled 2020-02-22: qty 1

## 2020-02-22 MED ORDER — THROMBIN 5000 UNITS EX SOLR
CUTANEOUS | Status: AC
  Filled 2020-02-22: qty 10000

## 2020-02-22 MED ORDER — TRAZODONE HCL 50 MG PO TABS
50.0000 mg | ORAL_TABLET | Freq: Every day | ORAL | Status: DC
  Administered 2020-02-22: 50 mg via ORAL
  Filled 2020-02-22: qty 1

## 2020-02-22 MED ORDER — SODIUM CHLORIDE 0.9 % IV SOLN
INTRAVENOUS | Status: DC | PRN
  Administered 2020-02-22: 500 mL

## 2020-02-22 MED ORDER — ROPINIROLE HCL 1 MG PO TABS
1.0000 mg | ORAL_TABLET | Freq: Three times a day (TID) | ORAL | Status: DC
  Administered 2020-02-22 (×2): 1 mg via ORAL
  Filled 2020-02-22 (×2): qty 1

## 2020-02-22 MED ORDER — PROPOFOL 10 MG/ML IV BOLUS
INTRAVENOUS | Status: DC | PRN
  Administered 2020-02-22: 100 mg via INTRAVENOUS

## 2020-02-22 MED ORDER — OXYCODONE HCL 5 MG PO TABS
10.0000 mg | ORAL_TABLET | ORAL | Status: DC | PRN
  Administered 2020-02-22 – 2020-02-23 (×4): 10 mg via ORAL
  Filled 2020-02-22 (×4): qty 2

## 2020-02-22 MED ORDER — SCOPOLAMINE 1 MG/3DAYS TD PT72
MEDICATED_PATCH | TRANSDERMAL | Status: AC
  Filled 2020-02-22: qty 1

## 2020-02-22 MED ORDER — PHENOL 1.4 % MT LIQD: 1.0000 | OROMUCOSAL | Status: DC | PRN

## 2020-02-22 MED ORDER — HEMOSTATIC AGENTS (NO CHARGE) OPTIME
TOPICAL | Status: DC | PRN
Start: 2020-02-22 — End: 2020-02-22
  Administered 2020-02-22: 1 via TOPICAL

## 2020-02-22 MED ORDER — ONDANSETRON HCL 4 MG/2ML IJ SOLN: 4.0000 mg | Freq: Four times a day (QID) | INTRAMUSCULAR | Status: DC | PRN

## 2020-02-22 MED ORDER — FENTANYL CITRATE (PF) 250 MCG/5ML IJ SOLN
INTRAMUSCULAR | Status: AC
  Filled 2020-02-22: qty 5

## 2020-02-22 MED ORDER — DEXAMETHASONE SODIUM PHOSPHATE 10 MG/ML IJ SOLN
INTRAMUSCULAR | Status: AC
  Filled 2020-02-22: qty 1

## 2020-02-22 MED ORDER — DIAZEPAM 5 MG PO TABS
ORAL_TABLET | ORAL | Status: AC
  Filled 2020-02-22: qty 2

## 2020-02-22 MED ORDER — PHENYLEPHRINE HCL (PRESSORS) 10 MG/ML IV SOLN
INTRAVENOUS | Status: DC | PRN
  Administered 2020-02-22: 120 ug via INTRAVENOUS
  Administered 2020-02-22: 80 ug via INTRAVENOUS

## 2020-02-22 MED ORDER — CEFAZOLIN SODIUM-DEXTROSE 1-4 GM/50ML-% IV SOLN
1.0000 g | Freq: Three times a day (TID) | INTRAVENOUS | Status: AC
  Administered 2020-02-22 (×2): 1 g via INTRAVENOUS
  Filled 2020-02-22 (×2): qty 50

## 2020-02-22 MED ORDER — MIDAZOLAM HCL 2 MG/2ML IJ SOLN
INTRAMUSCULAR | Status: AC
  Filled 2020-02-22: qty 2

## 2020-02-22 MED ORDER — ONDANSETRON HCL 4 MG/2ML IJ SOLN
4.0000 mg | Freq: Once | INTRAMUSCULAR | Status: AC | PRN
  Administered 2020-02-22: 4 mg via INTRAVENOUS

## 2020-02-22 MED ORDER — FENTANYL CITRATE (PF) 100 MCG/2ML IJ SOLN
INTRAMUSCULAR | Status: DC | PRN
  Administered 2020-02-22 (×2): 50 ug via INTRAVENOUS
  Administered 2020-02-22: 100 ug via INTRAVENOUS
  Administered 2020-02-22: 50 ug via INTRAVENOUS

## 2020-02-22 MED ORDER — HYDROMORPHONE HCL 1 MG/ML IJ SOLN
INTRAMUSCULAR | Status: DC | PRN
  Administered 2020-02-22: .5 mg via INTRAVENOUS

## 2020-02-22 MED ORDER — DIAZEPAM 5 MG PO TABS
5.0000 mg | ORAL_TABLET | Freq: Four times a day (QID) | ORAL | Status: DC | PRN
  Administered 2020-02-22: 5 mg via ORAL

## 2020-02-22 MED ORDER — PANTOPRAZOLE SODIUM 40 MG PO TBEC: 80.0000 mg | DELAYED_RELEASE_TABLET | Freq: Every day | ORAL | Status: DC

## 2020-02-22 MED ORDER — METOPROLOL SUCCINATE ER 50 MG PO TB24: 50.0000 mg | ORAL_TABLET | Freq: Every day | ORAL | Status: DC

## 2020-02-22 MED ORDER — MECLIZINE HCL 12.5 MG PO TABS: 12.5000 mg | ORAL_TABLET | Freq: Three times a day (TID) | ORAL | Status: DC | PRN

## 2020-02-22 MED ORDER — BISACODYL 10 MG RE SUPP: 10.0000 mg | Freq: Every day | RECTAL | Status: DC | PRN

## 2020-02-22 MED ORDER — DEXAMETHASONE SODIUM PHOSPHATE 10 MG/ML IJ SOLN
10.0000 mg | Freq: Once | INTRAMUSCULAR | Status: AC
  Administered 2020-02-22: 10 mg via INTRAVENOUS

## 2020-02-22 MED ORDER — FENTANYL CITRATE (PF) 100 MCG/2ML IJ SOLN
INTRAMUSCULAR | Status: AC
  Filled 2020-02-22: qty 2

## 2020-02-22 MED ORDER — BUSPIRONE HCL 15 MG PO TABS
30.0000 mg | ORAL_TABLET | Freq: Three times a day (TID) | ORAL | Status: DC
  Administered 2020-02-22 (×2): 30 mg via ORAL
  Filled 2020-02-22 (×3): qty 2

## 2020-02-22 MED ORDER — FERROUS SULFATE 325 (65 FE) MG PO TABS: 325.0000 mg | ORAL_TABLET | Freq: Every day | ORAL | Status: DC

## 2020-02-22 MED ORDER — LACTATED RINGERS IV SOLN
INTRAVENOUS | Status: DC | PRN
Start: 2020-02-22 — End: 2020-02-22

## 2020-02-22 MED ORDER — THROMBIN 5000 UNITS EX SOLR
CUTANEOUS | Status: DC | PRN
  Administered 2020-02-22: 10000 [IU] via TOPICAL

## 2020-02-22 MED ORDER — CEFAZOLIN SODIUM-DEXTROSE 2-4 GM/100ML-% IV SOLN
INTRAVENOUS | Status: AC
  Filled 2020-02-22: qty 100

## 2020-02-22 MED ORDER — ACETAMINOPHEN 650 MG RE SUPP: 650.0000 mg | RECTAL | Status: DC | PRN

## 2020-02-22 MED ORDER — CEFAZOLIN SODIUM-DEXTROSE 2-4 GM/100ML-% IV SOLN
2.0000 g | INTRAVENOUS | Status: AC
  Administered 2020-02-22: 2 g via INTRAVENOUS

## 2020-02-22 MED ORDER — MENTHOL 3 MG MT LOZG: 1.0000 | LOZENGE | OROMUCOSAL | Status: DC | PRN

## 2020-02-22 MED ORDER — VITAMIN D 25 MCG (1000 UNIT) PO TABS: 5000.0000 [IU] | ORAL_TABLET | Freq: Every day | ORAL | Status: DC

## 2020-02-22 MED ORDER — ACETAMINOPHEN 325 MG PO TABS: 650.0000 mg | ORAL_TABLET | ORAL | Status: DC | PRN

## 2020-02-22 MED ORDER — HYDROMORPHONE HCL 1 MG/ML IJ SOLN
INTRAMUSCULAR | Status: AC
  Filled 2020-02-22: qty 0.5

## 2020-02-22 MED ORDER — LIRAGLUTIDE 18 MG/3ML ~~LOC~~ SOPN: 0.6000 mg | PEN_INJECTOR | Freq: Every day | SUBCUTANEOUS | Status: DC

## 2020-02-22 MED ORDER — BUPIVACAINE HCL (PF) 0.25 % IJ SOLN
INTRAMUSCULAR | Status: AC
  Filled 2020-02-22: qty 30

## 2020-02-22 MED ORDER — LACTATED RINGERS IV SOLN: INTRAVENOUS | Status: DC | PRN

## 2020-02-22 MED ORDER — SODIUM CHLORIDE 0.9% FLUSH
3.0000 mL | Freq: Two times a day (BID) | INTRAVENOUS | Status: DC
  Administered 2020-02-22: 3 mL via INTRAVENOUS

## 2020-02-22 MED ORDER — PROPOFOL 500 MG/50ML IV EMUL
INTRAVENOUS | Status: DC | PRN
Start: 2020-02-22 — End: 2020-02-22
  Administered 2020-02-22: 25 ug/kg/min via INTRAVENOUS

## 2020-02-22 MED ORDER — MIDAZOLAM HCL 5 MG/5ML IJ SOLN
INTRAMUSCULAR | Status: DC | PRN
  Administered 2020-02-22: 2 mg via INTRAVENOUS

## 2020-02-22 MED ORDER — HYDROCODONE-ACETAMINOPHEN 10-325 MG PO TABS: 1.0000 | ORAL_TABLET | ORAL | Status: DC | PRN

## 2020-02-22 MED ORDER — IBUPROFEN 200 MG PO CAPS: 400.0000 mg | ORAL_CAPSULE | Freq: Three times a day (TID) | ORAL | Status: DC | PRN

## 2020-02-22 SURGICAL SUPPLY — 64 items
ADH SKN CLS APL DERMABOND .7 (GAUZE/BANDAGES/DRESSINGS) ×1
BAG DECANTER FOR FLEXI CONT (MISCELLANEOUS) ×3 IMPLANT
BENZOIN TINCTURE PRP APPL 2/3 (GAUZE/BANDAGES/DRESSINGS) ×3 IMPLANT
BLADE CLIPPER SURG (BLADE) IMPLANT
BONE MATRIX OSTEOCEL PRO MED (Bone Implant) ×3 IMPLANT
BUR MATCHSTICK NEURO 3.0 LAGG (BURR) IMPLANT
CAGE MODULUS XL 12X18X50 - 10 (Cage) ×3 IMPLANT
CARTRIDGE OIL MAESTRO DRILL (MISCELLANEOUS) IMPLANT
CLOSURE WOUND 1/2 X4 (GAUZE/BANDAGES/DRESSINGS) ×1
COVER BACK TABLE 60X90IN (DRAPES) ×3 IMPLANT
COVER WAND RF STERILE (DRAPES) IMPLANT
DERMABOND ADVANCED (GAUZE/BANDAGES/DRESSINGS) ×2
DERMABOND ADVANCED .7 DNX12 (GAUZE/BANDAGES/DRESSINGS) ×1 IMPLANT
DIFFUSER DRILL AIR PNEUMATIC (MISCELLANEOUS) IMPLANT
DRAPE C-ARM 42X72 X-RAY (DRAPES) ×3 IMPLANT
DRAPE C-ARMOR (DRAPES) ×3 IMPLANT
DRAPE LAPAROTOMY 100X72X124 (DRAPES) ×3 IMPLANT
DRAPE SURG 17X23 STRL (DRAPES) ×6 IMPLANT
DRSG OPSITE POSTOP 3X4 (GAUZE/BANDAGES/DRESSINGS) ×9 IMPLANT
DURAPREP 26ML APPLICATOR (WOUND CARE) ×3 IMPLANT
ELECT REM PT RETURN 9FT ADLT (ELECTROSURGICAL) ×3
ELECTRODE REM PT RTRN 9FT ADLT (ELECTROSURGICAL) ×1 IMPLANT
GAUZE 4X4 16PLY RFD (DISPOSABLE) IMPLANT
GLOVE BIO SURGEON STRL SZ 6.5 (GLOVE) ×2 IMPLANT
GLOVE BIO SURGEONS STRL SZ 6.5 (GLOVE) ×1
GLOVE BIOGEL PI IND STRL 6.5 (GLOVE) ×2 IMPLANT
GLOVE BIOGEL PI IND STRL 7.0 (GLOVE) ×2 IMPLANT
GLOVE BIOGEL PI INDICATOR 6.5 (GLOVE) ×4
GLOVE BIOGEL PI INDICATOR 7.0 (GLOVE) ×4
GLOVE ECLIPSE 9.0 STRL (GLOVE) IMPLANT
GLOVE EXAM NITRILE XL STR (GLOVE) IMPLANT
GLOVE SKINSENSE 9.0 STRL ORNG (GLOVE) ×6 IMPLANT
GLOVE SURG SS PI 6.5 STRL IVOR (GLOVE) ×18 IMPLANT
GOWN STRL REUS W/ TWL LRG LVL3 (GOWN DISPOSABLE) IMPLANT
GOWN STRL REUS W/ TWL XL LVL3 (GOWN DISPOSABLE) ×2 IMPLANT
GOWN STRL REUS W/TWL 2XL LVL3 (GOWN DISPOSABLE) IMPLANT
GOWN STRL REUS W/TWL LRG LVL3 (GOWN DISPOSABLE)
GOWN STRL REUS W/TWL XL LVL3 (GOWN DISPOSABLE) ×6
GUIDEWIRE NITINOL BEVEL TIP (WIRE) ×9 IMPLANT
KIT BASIN OR (CUSTOM PROCEDURE TRAY) ×3 IMPLANT
KIT DILATOR XLIF 5 (KITS) ×2 IMPLANT
KIT SURGICAL ACCESS MAXCESS 4 (KITS) ×3 IMPLANT
KIT TURNOVER KIT B (KITS) ×3 IMPLANT
KIT XLIF (KITS) ×1
MODULE NVM5 NEXT GEN EMG (NEEDLE) ×3 IMPLANT
NEEDLE HYPO 22GX1.5 SAFETY (NEEDLE) ×3 IMPLANT
NEEDLE I-PASS III (NEEDLE) ×3 IMPLANT
NS IRRIG 1000ML POUR BTL (IV SOLUTION) ×6 IMPLANT
OIL CARTRIDGE MAESTRO DRILL (MISCELLANEOUS)
PACK LAMINECTOMY NEURO (CUSTOM PROCEDURE TRAY) ×3 IMPLANT
ROD RELINE MAS LORD 5.5X50 (Rod) ×3 IMPLANT
SCREW LOCK RELINE 5.5 TULIP (Screw) ×6 IMPLANT
SCREW MAS RELINE 6.5X45 POLY (Screw) ×9 IMPLANT
SPONGE LAP 4X18 RFD (DISPOSABLE) IMPLANT
SPONGE SURGIFOAM ABS GEL SZ50 (HEMOSTASIS) ×3 IMPLANT
STRIP CLOSURE SKIN 1/2X4 (GAUZE/BANDAGES/DRESSINGS) ×2 IMPLANT
SUT VIC AB 2-0 CT1 18 (SUTURE) ×6 IMPLANT
SUT VIC AB 3-0 SH 8-18 (SUTURE) ×6 IMPLANT
TOWEL GREEN STERILE (TOWEL DISPOSABLE) ×3 IMPLANT
TOWEL GREEN STERILE FF (TOWEL DISPOSABLE) ×3 IMPLANT
TRAY FOL W/BAG SLVR 16FR STRL (SET/KITS/TRAYS/PACK) ×1 IMPLANT
TRAY FOLEY MTR SLVR 16FR STAT (SET/KITS/TRAYS/PACK) IMPLANT
TRAY FOLEY W/BAG SLVR 16FR LF (SET/KITS/TRAYS/PACK) ×3
WATER STERILE IRR 1000ML POUR (IV SOLUTION) ×3 IMPLANT

## 2020-02-22 NOTE — Anesthesia Procedure Notes (Signed)
Procedure Name: Intubation Date/Time: 02/22/2020 8:17 AM Performed by: Neldon Newport, CRNA Pre-anesthesia Checklist: Timeout performed, Patient being monitored, Suction available, Patient identified and Emergency Drugs available Patient Re-evaluated:Patient Re-evaluated prior to induction Oxygen Delivery Method: Circle system utilized Preoxygenation: Pre-oxygenation with 100% oxygen Induction Type: IV induction and Rapid sequence Laryngoscope Size: Mac and 3 Grade View: Grade I Tube type: Oral Tube size: 7.0 mm Number of attempts: 1 Placement Confirmation: ETT inserted through vocal cords under direct vision,  positive ETCO2 and breath sounds checked- equal and bilateral Secured at: 20 cm Tube secured with: Tape Dental Injury: Teeth and Oropharynx as per pre-operative assessment

## 2020-02-22 NOTE — Anesthesia Postprocedure Evaluation (Signed)
Anesthesia Post Note  Patient: Jocelyn Wilson  Procedure(s) Performed: Anterior Lateral Lumbar Fusion - Lumbar Three-Lumbar Four with percutaneous pedicle screws (Left )     Patient location during evaluation: PACU Anesthesia Type: General Level of consciousness: awake Pain management: pain level controlled Vital Signs Assessment: post-procedure vital signs reviewed and stable Respiratory status: spontaneous breathing, nonlabored ventilation, respiratory function stable and patient connected to nasal cannula oxygen Cardiovascular status: blood pressure returned to baseline and stable Postop Assessment: no apparent nausea or vomiting Anesthetic complications: no    Last Vitals:  Vitals:   02/22/20 1207 02/22/20 1459  BP: 137/73 (!) 119/53  Pulse: 93 96  Resp: 20 18  Temp: 36.6 C 36.8 C  SpO2: 95% 95%    Last Pain:  Vitals:   02/22/20 1459  TempSrc: Oral  PainSc:                  Jaymie Mckiddy P Concetta Guion

## 2020-02-22 NOTE — Op Note (Signed)
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  Asst. Surgeon: Reinaldo Meeker, NP  Anesthesia: General  Indication: 69 year old female with intractable back and bilateral lower extremity symptoms failing conservative management her work-up demonstrates evidence of significant degeneration with degenerative spondylolisthesis and stenosis at L3-4.  Patient presents now for decompression and fusion in hopes of improving her symptoms.  Operative note: After induction of anesthesia, patient positioned in the right lateral decubitus position and appropriately padded.  Patient's left flank and lumbar region prepped and draped sterilely.  Patient's bed was flexed.  Intraoperative localizing fluoroscopy was used.  Incision made over the L3-4 disc space laterally and a secondary incision was made in the left flank for access to the retroperitoneal space.  Using the flank incision blunt dissection was then performed to the retroperitoneal space.  The atrial peritoneal sac was swept anteriorly.  Using the lateral incision a dilator was then passed through the abdominal wall and docked into the psoas muscle and left L3-4 disc space.  Using intraoperative neural monitoring safe passage of the dilator was confirmed.  A K wire was then passed into the disc space.  The dilators were sequentially dilated and stimulation was performed ensuring safe passage free of the lumbar plexus.  Self-retaining tractor was placed.  This was stimulated and found to be in good position.  It was then docked into the disc space with a shim under fluoroscopic guidance.   Retractor was widened.  The disc space was directly visualized.  The disc base was stimulated and found to be free of any overlying nerves.  The disc was then incised with a 15 blade.  Discectomy then performed using various instruments.  Contralateral release was performed.  Endplates were prepared for interbody fusion.  The space was sequentially dilated.  A 12 mm lordotic implant was found to be most appropriate.  A 12 mm x 50 x 40mm NuVasive titanium cage was packed with osteocell plus.  This was then packed in the place and confirmed be in good position both the AP and lateral planes.  Applier and retractor was removed.  The bed was then placed in a neutral position.  The entry sites for the left L3 and L4 pedicles were determined using fluoroscopic guidance.  An incision was made overlying the entry sites.  Jamshidi needle introducers were then passed into the L3 and L4 pedicles under fluoroscopic visualization with intraoperative neural monitoring.  K wires were placed.  The pedicles were tapped again with intraoperative monitoring and 6.5 mm x 45 mm screws were placed at L3 and L4 on the left side.  Short segment titanium rod was then passed through the towers and pushed down into the screw heads.  Locking caps then placed over the screws.  Locking caps then given a final tightening and fully engaged.  The rod holder was removed as were the towers.  Final images reveal good position of the cage and the hardware at the proper upper level with normal alignment of the spine.  Wounds were then irrigated with antibiotic solution and then closed in typical fashion.  Steri-Strips and sterile dressing were applied.  No apparent complications.  Patient tolerated the procedure well and she returns to recovery room postop.

## 2020-02-22 NOTE — Transfer of Care (Signed)
Immediate Anesthesia Transfer of Care Note  Patient: Jocelyn Wilson  Procedure(s) Performed: Anterior Lateral Lumbar Fusion - Lumbar Three-Lumbar Four with percutaneous pedicle screws (Left )  Patient Location: PACU  Anesthesia Type:General  Level of Consciousness: awake, alert  and oriented  Airway & Oxygen Therapy: Patient Spontanous Breathing and Patient connected to nasal cannula oxygen  Post-op Assessment: Report given to RN, Post -op Vital signs reviewed and stable and Patient moving all extremities X 4  Post vital signs: Reviewed and stable  Last Vitals:  Vitals Value Taken Time  BP 146/81 02/22/20 1052  Temp    Pulse 95 02/22/20 1052  Resp 20 02/22/20 1052  SpO2 100 % 02/22/20 1052  Vitals shown include unvalidated device data.  Last Pain:  Vitals:   02/22/20 0717  TempSrc:   PainSc: 5          Complications: No apparent anesthesia complications

## 2020-02-22 NOTE — Evaluation (Signed)
Physical Therapy Evaluation Patient Details Name: Jocelyn Wilson MRN: [id number] DOB: [birth year] Today's Date: 02/22/2020   History of Present Illness  Pt is a 69 y/o female s/p ALIF L3-L4. PMH including but not limited to MS, DM and HLD.  Clinical Impression  Pt presented sitting upright in recliner chair, awake and willing to participate in therapy session. Prior to admission, pt reported that she ambulated with use of a cane and was independent with ADLs. Pt lives with her spouse in a two level home with a few steps to enter. At the time of evaluation, pt overall at a min guard level with functional mobility with use of RW for ambulation. PT provided pt education re: back precautions with handout provided, car transfers with demonstration and a generalized walking program for pt to initiate upon d/c home. PT will follow up with pt acutely to progress mobility as tolerated per PT POC. Plan for stair training at next session if appropriate.     Follow Up Recommendations No PT follow up;Supervision for mobility/OOB    Equipment Recommendations  Rolling walker with 5" wheels;Other (comment)(PEDIATRIC RW)    Recommendations for Other Services       Precautions / Restrictions Precautions Precautions: Fall;Back Precaution Booklet Issued: Yes (comment) Precaution Comments: reviewed 3/3 back precautions with pt and pt's spouse throughout Required Braces or Orthoses: Spinal Brace Spinal Brace: Lumbar corset;Applied in sitting position Restrictions Weight Bearing Restrictions: No      Mobility  Bed Mobility               General bed mobility comments: pt OOB in recliner chair upon arrival  Transfers Overall transfer level: Needs assistance Equipment used: Rolling walker (2 wheeled) Transfers: Sit to/from Stand Sit to Stand: Min guard         General transfer comment: pt perfromed x1 from recliner chair and x1 from toilet in bathroom  Ambulation/Gait Ambulation/Gait  assistance: Min guard Gait Distance (Feet): 30 Feet Assistive device: Rolling walker (2 wheeled) Gait Pattern/deviations: Step-to pattern;Decreased step length - right;Decreased step length - left;Decreased stride length Gait velocity: decreased   General Gait Details: pt with very slow, cautious and guarded gait with appropriate use of RW; no overt LOB or need for physical assistance, close min guard for safety  Stairs            Wheelchair Mobility    Modified Rankin (Stroke Patients Only)       Balance Overall balance assessment: Needs assistance Sitting-balance support: Feet supported Sitting balance-Leahy Scale: Good     Standing balance support: Bilateral upper extremity supported;Single extremity supported Standing balance-Leahy Scale: Poor                               Pertinent Vitals/Pain Pain Assessment: 0-10 Pain Score: 8  Pain Location: L buttock and back Pain Descriptors / Indicators: Grimacing;Guarding Pain Intervention(s): Monitored during session;Repositioned    Home Living Family/patient expects to be discharged to:: Private residence Living Arrangements: Spouse/significant other Available Help at Discharge: Family;Available 24 hours/day Type of Home: House Home Access: Stairs to enter   [employer] of Steps: 2 Home Layout: Two level;1/2 bath on main level Home Equipment: Walker - 4 wheels;Cane - single point;Toilet riser      Prior Function Level of Independence: Independent with assistive device(s)         Comments: ambulated with a cane PRN     Hand Dominance  Extremity/Trunk Assessment   Upper Extremity Assessment Upper Extremity Assessment: Defer to OT evaluation;Overall WFL for tasks assessed    Lower Extremity Assessment Lower Extremity Assessment: Overall WFL for tasks assessed    Cervical / Trunk Assessment Cervical / Trunk Assessment: Other exceptions Cervical / Trunk Exceptions: s/p  lumbar sx  Communication   Communication: No difficulties  Cognition Arousal/Alertness: Awake/alert Behavior During Therapy: WFL for tasks assessed/performed Overall Cognitive Status: Within Functional Limits for tasks assessed                                        General Comments      Exercises     Assessment/Plan    PT Assessment Patient needs continued PT services  PT Problem List Decreased strength;Decreased activity tolerance;Decreased balance;Decreased mobility;Decreased coordination;Decreased safety awareness;Decreased knowledge of precautions;Decreased knowledge of use of DME;Pain       PT Treatment Interventions DME instruction;Gait training;Stair training;Functional mobility training;Therapeutic activities;Therapeutic exercise;Balance training;Neuromuscular re-education;Patient/family education    PT Goals (Current goals can be found in the Care Plan section)  Acute Rehab PT Goals Patient Stated Goal: decrease pain PT Goal Formulation: With patient/family Time For Goal Achievement: 03/07/20 Potential to Achieve Goals: Good    Frequency Min 5X/week   Barriers to discharge        Co-evaluation               AM-PAC PT "6 Clicks" Mobility  Outcome Measure Help needed turning from your back to your side while in a flat bed without using bedrails?: A Little Help needed moving from lying on your back to sitting on the side of a flat bed without using bedrails?: A Little Help needed moving to and from a bed to a chair (including a wheelchair)?: None Help needed standing up from a chair using your arms (e.g., wheelchair or bedside chair)?: None Help needed to walk in hospital room?: None Help needed climbing 3-5 steps with a railing? : A Little 6 Click Score: 21    End of Session Equipment Utilized During Treatment: Gait belt Activity Tolerance: Patient tolerated treatment well Patient left: in chair;with call bell/phone within reach;with  family/visitor present Nurse Communication: Mobility status PT Visit Diagnosis: Other abnormalities of gait and mobility (R26.89)    Time: [id number] PT Time Calculation (min) (ACUTE ONLY): 38 min   Charges:   PT Evaluation $PT Eval Moderate Complexity: 1 Mod PT Treatments $Gait Training: 8-22 mins $Therapeutic Activity: 8-22 mins        Anastasio Champion, DPT  Acute Rehabilitation Services Pager [phone] Office [place] 02/22/2020, 3:33 PM

## 2020-02-22 NOTE — Progress Notes (Signed)
Orthopedic Tech Progress Note Patient Details:  Jocelyn Wilson [birth year] [id number] RN said patient has brace Patient ID: Jocelyn Wilson, female   DOB: 08-22-1951, 69 y.o.   MRN: [id number]   [name] 02/22/2020, 1:01 PM

## 2020-02-22 NOTE — H&P (Signed)
Jocelyn Wilson is an 69 y.o. female.   Chief Complaint: Back pain HPI: 69 year old female with progressive back pain with bilateral lower extremity symptoms failing conservative management.  Work-up demonstrates evidence of a grade 1 L3-4 degenerative spondylolisthesis with significant stenosis.  Patient has failed conservative management.  She presents now for L3-4 anterior lateral interbody fusion with posterior percutaneous fixation.  Past Medical History:  Diagnosis Date  . Allergy   . Anxiety   . Arthritis   . Asthma    does not use an inhaer- patient does not think she does  . Back pain   . Chronic kidney disease    KIDNEY STONES WITH STENTS  . Depression   . Diabetes ([place])    type II  . Diverticula of colon   . Dyspnea    if climbing steps  . Eczema   . Food allergy   . Gallbladder problem   . GERD (gastroesophageal reflux disease)   . History of kidney stones   . Hypercholesterolemia   . Hyperlipidemia   . Hypothyroidism   . Joint pain   . Kidney stones   . Leg edema   . Migraine headache    with pressure change  . Multiple sclerosis ([place])   . Obesity   . Palpitations   . PONV (postoperative nausea and vomiting)    with breast reduction surgery in [place] from the ~ 1980's/1990's  . Restless leg syndrome   . Right sided sciatica 10/29/2015  . Snoring   . Thyroid disease    hyperthyroidism  . Unsteady gait   . Vertigo   . Vision abnormalities     Past Surgical History:  Procedure Laterality Date  . BREAST SURGERY     AUGMENTATION  . carpel tunnel Bilateral   . CHOLECYSTECTOMY    . DILATION AND CURETTAGE OF UTERUS  1983  . ESOPHAGOGASTRODUODENOSCOPY    . FINGER SURGERY  x8    trigger finger all except left pinky- right pinky didnt work  . LITHOTRIPSY    . NISSEN FUNDOPLICATION    . VENTRAL HERNIA REPAIR  07/25/2012   Procedure: LAPAROSCOPIC VENTRAL HERNIA;  Surgeon: Adin Hector, MD;  Location: [facility];  Service: General;  Laterality: N/A;     Family History  Problem Relation Age of Onset  . Hyperlipidemia Mother   . Glaucoma Mother   . Hypertension Mother   . Anxiety disorder Mother   . Depression Mother   . Heart disease Father   . Cancer Father        prostate  . Diabetes Father   . Dementia Father   . Hypertension Father   . Hyperlipidemia Father   . Sleep apnea Father   . Obesity Father   . Cancer Brother        esophageal  . Arthritis Sister        RA   Social History:  reports that she quit smoking about 43 years ago. She has never used smokeless tobacco. She reports that she does not drink alcohol or use drugs.  Allergies:  Allergies  Allergen Reactions  . Iron Anaphylaxis    Iv iron  . Ciprofloxacin Nausea Only  . Contrast Media [Iodinated Diagnostic Agents] Itching and Rash  . Latex Rash  . Other Other (See Comments)    Raw apples. Cucumbers, hazelnuts, Raw vegetables, Raw fruits, except citrus    . Sulfa Antibiotics Itching and Rash  . Tape Rash    Adhesive tape  Medications Prior to Admission  Medication Sig Dispense Refill  . atorvastatin (LIPITOR) 20 MG tablet Take 20 mg by mouth at bedtime.     . busPIRone (BUSPAR) 30 MG tablet TAKE 1 TABLET BY MOUTH 3  TIMES DAILY (Patient taking differently: Take 30 mg by mouth 3 (three) times daily. ) 270 tablet 1  . Cyanocobalamin (VITAMIN B12) 1000 MCG TBCR Take 1,000 mcg by mouth daily.     Marland Kitchen esomeprazole (NEXIUM) 40 MG capsule TAKE 1 CAPSULE BY MOUTH  TWICE DAILY (Patient taking differently: Take 40 mg by mouth 2 (two) times daily before a meal. TAKE 1 CAPSULE BY MOUTH  TWICE DAILY) 180 capsule 3  . ferrous sulfate 325 (65 FE) MG tablet Take 325 mg by mouth daily.     . Ibuprofen (ADVIL) 200 MG CAPS Take 400 mg by mouth 3 (three) times daily as needed (headache or migraine).     Marland Kitchen levothyroxine (SYNTHROID) 137 MCG tablet Take 137 mcg by mouth daily before breakfast.     . liraglutide (VICTOZA) 18 MG/3ML SOPN ADMINISTER 0.6 MG UNDER THE SKIN  EVERY MORNING (Patient taking differently: Inject 0.6 mg into the skin daily with breakfast. ) 3 mL 0  . meclizine (ANTIVERT) 12.5 MG tablet TAKE 1 TABLET(12.5 MG) BY MOUTH THREE TIMES DAILY AS NEEDED FOR DIZZINESS (Patient taking differently: Take 12.5 mg by mouth 3 (three) times daily as needed for dizziness. TAKE 1 TABLET(12.5 MG) BY MOUTH THREE TIMES DAILY AS NEEDED FOR DIZZINESS) 270 tablet 1  . metFORMIN (GLUCOPHAGE-XR) 500 MG 24 hr tablet Take 1,000 mg by mouth 2 (two) times daily.    . metoprolol succinate (TOPROL-XL) 50 MG 24 hr tablet TAKE 1 TABLET BY MOUTH  DAILY (Patient taking differently: Take 50 mg by mouth daily. ) 90 tablet 3  . potassium citrate (UROCIT-K) 10 MEQ (1080 MG) SR tablet Take 20 mEq by mouth 3 (three) times daily with meals.     Marland Kitchen rOPINIRole (REQUIP) 1 MG tablet TAKE 1 TABLET BY MOUTH 4  TIMES DAILY (Patient taking differently: Take 1 mg by mouth in the morning, at noon, in the evening, and at bedtime. ) 360 tablet 3  . sertraline (ZOLOFT) 100 MG tablet TAKE 1 AND 1/2 TABLETS BY  MOUTH DAILY (Patient taking differently: Take 150 mg by mouth daily. ) 135 tablet 3  . traMADol (ULTRAM) 50 MG tablet Take 1 tablet (50 mg total) by mouth 3 (three) times daily as needed. 270 tablet 1  . traZODone (DESYREL) 150 MG tablet Take 50 mg by mouth at bedtime.    Marland Kitchen VITAMIN D PO Take 5,000 Units by mouth daily.     . Insulin Pen Needle (BD PEN NEEDLE NANO U/F) 32G X 4 MM MISC 1 Package by Does not apply route 2 (two) times daily. 100 each 0  . ONE TOUCH ULTRA TEST test strip USE TO CHECK FASTING AND PRE-DINNER BLOOD GLUCOSE BID  12  . ONETOUCH DELICA LANCETS [id number] MISC USE TO CHECK FASTING AND PRE-DINNER BLOOD GLUCOSE BID  12    Results for orders placed or performed during the hospital encounter of 02/22/20 (from the past 48 hour(s))  Glucose, capillary     Status: Abnormal   Collection Time: 02/22/20  6:52 AM  Result Value Ref Range   Glucose-Capillary 113 (H) 70 - 99 mg/dL     Comment: Glucose reference range applies only to samples taken after fasting for at least 8 hours.   No results found.  Pertinent items noted in HPI and remainder of comprehensive ROS otherwise negative.  Blood pressure 137/74, pulse 92, temperature 98.3 F (36.8 C), temperature source Oral, resp. rate 18, height 4\' 9"  (1.448 m), weight 102.1 kg, SpO2 98 %.  Patient is awake and alert.  She is oriented and appropriate.  Speech is fluent.  Judgment insight are intact.  Cranial nerve function normal bilateral.  Motor examination extremities intact.  Sensor examination nonfocal.  Reflexes hypoactive but symmetric.  No evidence of long track signs.  Gait antalgic.  Posture mildly flexed.  Examination of the head ears eyes nose throat is unremarkable chest and abdomen are benign.  Extremities are free from injury or deformity. Assessment/Plan L3-4 degenerative spondylolisthesis with stenosis.  Plan left L3-4 anterior lateral retroperitoneal interbody decompression and fusion utilizing interbody cage, morselized allograft, and augmented with posterior percutaneous pedicle screw fixation.  Risks and benefits been explained.  Patient wishes to proceed.  Mallie Mussel A Jay Kempe 02/22/2020, 7:55 AM

## 2020-02-22 NOTE — Brief Op Note (Signed)
02/22/2020  10:13 AM  PATIENT:  Jocelyn Wilson  69 y.o. female  PRE-OPERATIVE DIAGNOSIS:  Spondylolisthesis  POST-OPERATIVE DIAGNOSIS:  Spondylolisthesis  PROCEDURE:  Procedure(s) with comments: Anterior Lateral Lumbar Fusion - Lumbar Three-Lumbar Four with percutaneous pedicle screws (Left) - Anterior Lateral Lumbar Fusion - Lumbar Three-Lumbar Four with percutaneous pedicle screws  SURGEON:  Surgeon(s) and Role:    Earnie Larsson, MD - Primary  PHYSICIAN ASSISTANT:   ASSISTANTSMearl Latin   ANESTHESIA:   general  EBL:  50 mL   BLOOD ADMINISTERED:none  DRAINS: none   LOCAL MEDICATIONS USED:  MARCAINE     SPECIMEN:  No Specimen  DISPOSITION OF SPECIMEN:  N/A  COUNTS:  YES  TOURNIQUET:  * No tourniquets in log *  DICTATION: .Dragon Dictation  PLAN OF CARE: Admit for overnight observation  PATIENT DISPOSITION:  PACU - hemodynamically stable.   Delay start of Pharmacological VTE agent (>24hrs) due to surgical blood loss or risk of bleeding: yes

## 2020-02-23 DIAGNOSIS — M4316 Spondylolisthesis, lumbar region: Secondary | ICD-10-CM | POA: Diagnosis not present

## 2020-02-23 LAB — GLUCOSE, CAPILLARY: Glucose-Capillary: 108 mg/dL — ABNORMAL HIGH (ref 70–99)

## 2020-02-23 MED ORDER — METHOCARBAMOL 500 MG PO TABS: 500.0000 mg | ORAL_TABLET | Freq: Four times a day (QID) | ORAL | 0 refills | Status: DC

## 2020-02-23 MED ORDER — OXYCODONE-ACETAMINOPHEN 7.5-325 MG PO TABS: 1.0000 | ORAL_TABLET | ORAL | 0 refills | Status: DC | PRN

## 2020-02-23 NOTE — Evaluation (Signed)
Occupational Therapy Evaluation Patient Details Name: Jocelyn Wilson MRN: [id number] DOB: 1951/03/05 Today's Date: 02/23/2020    History of Present Illness Pt is a 69 y/o female s/p ALIF L3-L4. PMH including but not limited to MS, DM and HLD.   Clinical Impression   Patient is s/p see above surgery resulting in the deficits listed below (see OT Problem List).  Patient at this time when returning to home will have family assistance, going to remain on main level of the home and sleeping in recliner chair due to GERD at night. Patient requires min guard to min assist depending on surface with LE dressing and set up to supervision for UE dressing. Patient will benefit from skilled OT to increase their safety and independence with ADL and functional mobility for ADL (while adhering to their precautions) to facilitate discharge to venue listed below. Back handout reviewed adls in detail. Pt educated on: clothing between brace,avoid sitting for long periods of time, correct bed positioning for sleeping, correct sequence for bed mobility, avoiding lifting more than 5 pounds and never wash directly over incision. All education is complete and patient indicates understanding.        Follow Up Recommendations  Supervision - Intermittent;No OT follow up    Equipment Recommendations       Recommendations for Other Services       Precautions / Restrictions Precautions Precautions: Fall;Back Precaution Booklet Issued: Yes (comment) Precaution Comments: reviewed 3/3 back precautions with pt and pt's spouse throughout Required Braces or Orthoses: Spinal Brace Spinal Brace: Lumbar corset;Applied in sitting position Restrictions Weight Bearing Restrictions: No      Mobility Bed Mobility Overal bed mobility: Needs Assistance(per pt they sleep in recliner in home) Bed Mobility: Supine to Sit     Supine to sit: Min guard;HOB elevated        Transfers Overall transfer level: Needs  assistance Equipment used: Rolling walker (2 wheeled) Transfers: Sit to/from Stand Sit to Stand: Min guard              Balance Overall balance assessment: Needs assistance Sitting-balance support: Feet supported Sitting balance-Leahy Scale: Good     Standing balance support: Bilateral upper extremity supported;Single extremity supported Standing balance-Leahy Scale: Fair                             ADL either performed or assessed with clinical judgement   ADL Overall ADL's : Needs assistance/impaired Eating/Feeding: Independent;Sitting   Grooming: Wash/dry hands;Wash/dry face;Minimal assistance;Standing   Upper Body Bathing: Supervision/ safety;Sitting   Lower Body Bathing: Cueing for safety;Cueing for sequencing;Sit to/from stand;Min guard   Upper Body Dressing : Supervision/safety;Sitting;Standing   Lower Body Dressing: Min guard;Sit to/from stand   Toilet Transfer: Min guard;Grab bars;RW   Toileting- Water quality scientist and Hygiene: Min guard;Cueing for safety;Cueing for sequencing;Sit to/from stand   Tub/ Shower Transfer: Min guard   Functional mobility during ADLs: Min guard;Rolling walker       Vision Baseline Vision/History: Wears glasses Wears Glasses: At all times Patient Visual Report: No change from baseline Vision Assessment?: No apparent visual deficits     Perception Perception Perception Tested?: No   Praxis Praxis Praxis tested?: Not tested    Pertinent Vitals/Pain Pain Assessment: 0-10 Pain Score: 8  Pain Location: L buttock and back Pain Descriptors / Indicators: Grimacing;Guarding Pain Intervention(s): Monitored during session     Hand Dominance Right   Extremity/Trunk Assessment Upper Extremity Assessment Upper Extremity  Assessment: Overall WFL for tasks assessed   Lower Extremity Assessment Lower Extremity Assessment: Defer to PT evaluation   Cervical / Trunk Assessment Cervical / Trunk Assessment: Other  exceptions Cervical / Trunk Exceptions: s/p lumbar sx   Communication Communication Communication: No difficulties   Cognition Arousal/Alertness: Awake/alert Behavior During Therapy: WFL for tasks assessed/performed Overall Cognitive Status: Within Functional Limits for tasks assessed                                     General Comments       Exercises     Shoulder Instructions      Home Living Family/patient expects to be discharged to:: Private residence Living Arrangements: Spouse/significant other Available Help at Discharge: Family;Available 24 hours/day Type of Home: House Home Access: Stairs to enter [employer] of Steps: 3   Home Layout: Two level;1/2 bath on main level Alternate Level Stairs-Number of Steps: flight   Bathroom Shower/Tub: Walk-in shower         Home Equipment: Environmental consultant - 4 wheels;Cane - single point;Toilet riser   Additional Comments: per pt they sleep in chair on main level and plan to complete sponge bath      Prior Functioning/Environment Level of Independence: Independent with assistive device(s)        Comments: ambulated with a cane PRN        OT Problem List: Decreased strength;Decreased activity tolerance;Impaired balance (sitting and/or standing);Decreased safety awareness;Decreased knowledge of use of DME or AE;Pain      OT Treatment/Interventions:      OT Goals(Current goals can be found in the care plan section) Acute Rehab OT Goals Patient Stated Goal: decrease pain OT Goal Formulation: With patient Time For Goal Achievement: 03/08/20 Potential to Achieve Goals: Good  OT Frequency:     Barriers to D/C:            Co-evaluation              AM-PAC OT "6 Clicks" Daily Activity     Outcome Measure Help from another person eating meals?: None Help from another person taking care of personal grooming?: None Help from another person toileting, which includes using toliet, bedpan, or  urinal?: A Little Help from another person bathing (including washing, rinsing, drying)?: A Little Help from another person to put on and taking off regular upper body clothing?: None Help from another person to put on and taking off regular lower body clothing?: A Little 6 Click Score: 21   End of Session Equipment Utilized During Treatment: Gait belt;Rolling walker;Back brace  Activity Tolerance: Patient tolerated treatment well Patient left: in chair;with call bell/phone within reach  OT Visit Diagnosis: Unsteadiness on feet (R26.81);Muscle weakness (generalized) (M62.81);Pain Pain - Right/Left: Left(back)                Time: [id number] OT Time Calculation (min): 33 min Charges:  OT General Charges $OT Visit: 1 Visit OT Evaluation $OT Eval Low Complexity: 1 Low OT Treatments $Self Care/Home Management : 8-22 mins  Joeseph Amor OTR/L  Acute Rehab Services  [phone] office number [phone] pager number   Joeseph Amor 02/23/2020, 9:05 AM

## 2020-02-23 NOTE — Discharge Summary (Signed)
Physician Discharge Summary  Patient ID: Jocelyn Wilson MRN: [id number] DOB/AGE: 1951-02-13 69 y.o.  Admit date: 02/22/2020 Discharge date: 02/23/2020  Admission Diagnoses:Grade 1 L3-4 degenerative spondylolisthesis with stenosis   Discharge Diagnoses: same   Discharged Condition: good  Hospital Course: The patient was admitted on 02/22/2020 and taken to the operating room where the patient underwent Left L3-L4 anterior lateral retroperitoneal interbody decompression . The patient tolerated the procedure well and was taken to the recovery room and then to the floor in stable condition. The hospital course was routine. There were no complications. The wound remained clean dry and intact. Pt had appropriate back soreness. No complaints of leg pain or new N/T/W. The patient remained afebrile with stable vital signs, and tolerated a regular diet. The patient continued to increase activities, and pain was well controlled with oral pain medications.   Consults: None  Significant Diagnostic Studies:  Results for orders placed or performed during the hospital encounter of 02/22/20  Glucose, capillary  Result Value Ref Range   Glucose-Capillary 113 (H) 70 - 99 mg/dL  Glucose, capillary  Result Value Ref Range   Glucose-Capillary 167 (H) 70 - 99 mg/dL  Glucose, capillary  Result Value Ref Range   Glucose-Capillary 273 (H) 70 - 99 mg/dL  Glucose, capillary  Result Value Ref Range   Glucose-Capillary 232 (H) 70 - 99 mg/dL   Comment 1 Notify RN    Comment 2 Document in Chart   Glucose, capillary  Result Value Ref Range   Glucose-Capillary 108 (H) 70 - 99 mg/dL   Comment 1 Notify RN    Comment 2 Document in Chart     DG Lumbar Spine 2-3 Views  Result Date: 02/22/2020 CLINICAL DATA:  Anterior lumbar fusion, L3-4 with unilateral pedicle screw placement. EXAM: LUMBAR SPINE - 2-3 VIEW; DG C-ARM 1-60 MIN COMPARISON:  Lumbar spine from 11/13/2018 FINDINGS: Limited imaging of the lumbar spine  with coned in fluoroscopic views, 2 total images in AP and lateral projection spinal leveling is difficult due to projection and coned in view, but it appears to be at the L3-L4 level as described. Pedicle screw and rod placement with w LEFT-sided approach as well as interbody cage placement. No complicating features. Mild anterolisthesis of L3 on L4 is similar to the previous study. FLUOROSCOPY TIME:  2 minutes 26 seconds IMPRESSION: Signs of LEFT sided rod and pedicle screw fixation and interbody cage placement at the L3-L4 level on limited, coned in AP and lateral views as described. Electronically Signed   By: Zetta Bills M.D.   On: 02/22/2020 12:28   DG C-Arm 1-60 Min  Result Date: 02/22/2020 CLINICAL DATA:  Anterior lumbar fusion, L3-4 with unilateral pedicle screw placement. EXAM: LUMBAR SPINE - 2-3 VIEW; DG C-ARM 1-60 MIN COMPARISON:  Lumbar spine from 11/13/2018 FINDINGS: Limited imaging of the lumbar spine with coned in fluoroscopic views, 2 total images in AP and lateral projection spinal leveling is difficult due to projection and coned in view, but it appears to be at the L3-L4 level as described. Pedicle screw and rod placement with w LEFT-sided approach as well as interbody cage placement. No complicating features. Mild anterolisthesis of L3 on L4 is similar to the previous study. FLUOROSCOPY TIME:  2 minutes 26 seconds IMPRESSION: Signs of LEFT sided rod and pedicle screw fixation and interbody cage placement at the L3-L4 level on limited, coned in AP and lateral views as described. Electronically Signed   By: Jewel Baize.D.  On: 02/22/2020 12:28   DG OR LOCAL ABDOMEN  Result Date: 02/22/2020 CLINICAL DATA:  Back surgery. Rule out surgical foreign body. EXAM: OR LOCAL ABDOMEN COMPARISON:  None. FINDINGS: Left-sided L4-5 pedicle screws and posterior rod noted. There is an interbody fusion device in place. Extensive mesh is noted from anterior abdominal wall hernia repair. No  unexpected radiopaque foreign body/surgical instruments. IMPRESSION: L4-5 fusion changes. No unexpected radiopaque foreign body/surgical instruments. Electronically Signed   By: Marijo Sanes M.D.   On: 02/22/2020 10:32    Antibiotics:  Anti-infectives (From admission, onward)   Start     Dose/Rate Route Frequency Ordered Stop   02/22/20 1600  ceFAZolin (ANCEF) IVPB 1 g/50 mL premix     1 g 100 mL/hr over 30 Minutes Intravenous Every 8 hours 02/22/20 1155 02/23/20 0008   02/22/20 0906  bacitracin 50,000 Units in sodium chloride 0.9 % 500 mL irrigation  Status:  Discontinued       As needed 02/22/20 0907 02/22/20 1043   02/22/20 0700  ceFAZolin (ANCEF) IVPB 2g/100 mL premix     2 g 200 mL/hr over 30 Minutes Intravenous On call to O.R. 02/22/20 [id number] 02/22/20 0843   02/22/20 0700  ceFAZolin (ANCEF) IVPB 2g/100 mL premix  Status:  Discontinued     2 g 200 mL/hr over 30 Minutes Intravenous On call to O.R. 02/22/20 [id number] 02/22/20 0657   02/22/20 0657  ceFAZolin (ANCEF) 2-4 GM/100ML-% IVPB    Note to Pharmacy: Nyoka Cowden   : cabinet override      02/22/20 0657 02/22/20 0854      Discharge Exam: Blood pressure 103/80, pulse 87, temperature 97.7 F (36.5 C), temperature source Oral, resp. rate 18, height 4\' 9"  (1.448 m), weight 102.1 kg, SpO2 98 %. Neurologic: Grossly normal Ambulating and voiding well, incision cdi  Discharge Medications:   Allergies as of 02/23/2020      Reactions   Iron Anaphylaxis   Iv iron   Ciprofloxacin Nausea Only   Contrast Media [iodinated Diagnostic Agents] Itching, Rash   Latex Rash   Other Other (See Comments)   Raw apples. Cucumbers, hazelnuts, Raw vegetables, Raw fruits, except citrus    Sulfa Antibiotics Itching, Rash   Tape Rash   Adhesive tape      Medication List    TAKE these medications   Advil 200 MG Caps Generic drug: Ibuprofen Take 400 mg by mouth 3 (three) times daily as needed (headache or migraine).   atorvastatin 20 MG  tablet Commonly known as: LIPITOR Take 20 mg by mouth at bedtime.   busPIRone 30 MG tablet Commonly known as: BUSPAR TAKE 1 TABLET BY MOUTH 3  TIMES DAILY   esomeprazole 40 MG capsule Commonly known as: NEXIUM TAKE 1 CAPSULE BY MOUTH  TWICE DAILY What changed:   how much to take  how to take this  when to take this   ferrous sulfate 325 (65 FE) MG tablet Take 325 mg by mouth daily.   Insulin Pen Needle 32G X 4 MM Misc Commonly known as: BD Pen Needle Nano U/F 1 Package by Does not apply route 2 (two) times daily.   levothyroxine 137 MCG tablet Commonly known as: SYNTHROID Take 137 mcg by mouth daily before breakfast.   liraglutide 18 MG/3ML Sopn Commonly known as: Victoza ADMINISTER 0.6 MG UNDER THE SKIN EVERY MORNING What changed:   how much to take  how to take this  when to take this  additional instructions   meclizine  12.5 MG tablet Commonly known as: ANTIVERT TAKE 1 TABLET(12.5 MG) BY MOUTH THREE TIMES DAILY AS NEEDED FOR DIZZINESS What changed:   how much to take  how to take this  when to take this  reasons to take this   metFORMIN 500 MG 24 hr tablet Commonly known as: GLUCOPHAGE-XR Take 1,000 mg by mouth 2 (two) times daily.   methocarbamol 500 MG tablet Commonly known as: Robaxin Take 1 tablet (500 mg total) by mouth 4 (four) times daily.   metoprolol succinate 50 MG 24 hr tablet Commonly known as: TOPROL-XL TAKE 1 TABLET BY MOUTH  DAILY   ONE TOUCH ULTRA TEST test strip Generic drug: glucose blood USE TO CHECK FASTING AND PRE-DINNER BLOOD GLUCOSE BID   OneTouch Delica Lancets 99991111 Misc USE TO CHECK FASTING AND PRE-DINNER BLOOD GLUCOSE BID   oxyCODONE-acetaminophen 7.5-325 MG tablet Commonly known as: Percocet Take 1 tablet by mouth every 4 (four) hours as needed for severe pain.   potassium citrate 10 MEQ (1080 MG) SR tablet Commonly known as: UROCIT-K Take 20 mEq by mouth 3 (three) times daily with meals.   rOPINIRole 1 MG  tablet Commonly known as: REQUIP TAKE 1 TABLET BY MOUTH 4  TIMES DAILY What changed: See the new instructions.   sertraline 100 MG tablet Commonly known as: ZOLOFT TAKE 1 AND 1/2 TABLETS BY  MOUTH DAILY   traMADol 50 MG tablet Commonly known as: Ultram Take 1 tablet (50 mg total) by mouth 3 (three) times daily as needed.   traZODone 150 MG tablet Commonly known as: DESYREL Take 50 mg by mouth at bedtime.   Vitamin B12 1000 MCG Tbcr Take 1,000 mcg by mouth daily.   VITAMIN D PO Take 5,000 Units by mouth daily.            Durable Medical Equipment  (From admission, onward)         Start     Ordered   02/22/20 1155  DME Walker rolling  Once    Question:  Patient needs a walker to treat with the following condition  Answer:  Degenerative spondylolisthesis   02/22/20 1155   02/22/20 1155  DME 3 n 1  Once     02/22/20 1155          Disposition: home   Final Dx: L3-4 anterior lateral fusion  Discharge Instructions     Remove dressing in 72 hours   Complete by: As directed    Call MD for:  difficulty breathing, headache or visual disturbances   Complete by: As directed    Call MD for:  hives   Complete by: As directed    Call MD for:  persistant dizziness or light-headedness   Complete by: As directed    Call MD for:  persistant nausea and vomiting   Complete by: As directed    Call MD for:  redness, tenderness, or signs of infection (pain, swelling, redness, odor or green/yellow discharge around incision site)   Complete by: As directed    Call MD for:  severe uncontrolled pain   Complete by: As directed    Call MD for:  temperature >100.4   Complete by: As directed    Diet - low sodium heart healthy   Complete by: As directed    Driving Restrictions   Complete by: As directed    No driving for 2 weeks, no riding in the car for 1 week   Increase activity slowly   Complete by:  As directed    Lifting restrictions   Complete by: As directed    No  lifting more than 8 lbs         Signed: Ocie Cornfield Enola Siebers 02/23/2020, 7:53 AM

## 2020-02-23 NOTE — Discharge Instructions (Signed)

## 2020-02-23 NOTE — Progress Notes (Signed)
Physical Therapy Treatment Patient Details Name: Jocelyn Wilson MRN: [id number] DOB: 1951-05-29 Today's Date: 02/23/2020    History of Present Illness Pt is a 69 y/o female s/p ALIF L3-L4. PMH including but not limited to MS, DM and HLD.    PT Comments    Pt making steady progress with functional mobility as indicated by her ability to ambulate a further distance and participate in stair training this session. Pt's husband present throughout. Plan is to d/c home today with family support.    Follow Up Recommendations  No PT follow up;Supervision for mobility/OOB     Equipment Recommendations  Rolling walker with 5" wheels;Other (comment)(PEDIATRIC RW)    Recommendations for Other Services       Precautions / Restrictions Precautions Precautions: Fall;Back Precaution Booklet Issued: Yes (comment) Precaution Comments: reviewed 3/3 back precautions with pt and pt's spouse throughout Required Braces or Orthoses: Spinal Brace Spinal Brace: Lumbar corset;Applied in sitting position Restrictions Weight Bearing Restrictions: No    Mobility  Bed Mobility Overal bed mobility: Needs Assistance(per pt they sleep in recliner in home) Bed Mobility: Supine to Sit     Supine to sit: Min guard;HOB elevated     General bed mobility comments: pt OOB in recliner chair upon arrival  Transfers Overall transfer level: Needs assistance Equipment used: Rolling walker (2 wheeled) Transfers: Sit to/from Stand Sit to Stand: Supervision         General transfer comment: pt perfromed x1 from recliner chair and x1 from toilet in bathroom  Ambulation/Gait Ambulation/Gait assistance: Min guard Gait Distance (Feet): 75 Feet Assistive device: Rolling walker (2 wheeled) Gait Pattern/deviations: Step-to pattern;Decreased step length - right;Decreased step length - left;Decreased stride length Gait velocity: decreased   General Gait Details: pt with very slow, cautious and guarded gait  with appropriate use of RW; no overt LOB or need for physical assistance, close min guard for safety   Stairs Stairs: Yes Stairs assistance: Min assist Stair Management: One rail Right;Step to pattern;Sideways Number of Stairs: 2 General stair comments: min A as pt with LE weakness and requiring to power up and for stability with descending; pt's husband present throughout session   Wheelchair Mobility    Modified Rankin (Stroke Patients Only)       Balance Overall balance assessment: Needs assistance Sitting-balance support: Feet supported Sitting balance-Leahy Scale: Good     Standing balance support: Bilateral upper extremity supported;Single extremity supported Standing balance-Leahy Scale: Poor                              Cognition Arousal/Alertness: Awake/alert Behavior During Therapy: WFL for tasks assessed/performed Overall Cognitive Status: Within Functional Limits for tasks assessed                                        Exercises      General Comments        Pertinent Vitals/Pain Pain Assessment: Faces Pain Score: 8  Faces Pain Scale: Hurts even more Pain Location: L buttock and back Pain Descriptors / Indicators: Grimacing;Guarding Pain Intervention(s): Monitored during session;Repositioned    Home Living Family/patient expects to be discharged to:: Private residence Living Arrangements: Spouse/significant other Available Help at Discharge: Family;Available 24 hours/day Type of Home: House Home Access: Stairs to enter   Home Layout: Two level;1/2 bath on main level Home Equipment: Gilford Rile -  4 wheels;Cane - single point;Toilet riser Additional Comments: per pt they sleep in chair on main level and plan to complete sponge bath    Prior Function Level of Independence: Independent with assistive device(s)      Comments: ambulated with a cane PRN   PT Goals (current goals can now be found in the care plan section)  Acute Rehab PT Goals Patient Stated Goal: decrease pain PT Goal Formulation: With patient/family Time For Goal Achievement: 03/07/20 Potential to Achieve Goals: Good Progress towards PT goals: Progressing toward goals    Frequency    Min 5X/week      PT Plan Current plan remains appropriate    Co-evaluation              AM-PAC PT "6 Clicks" Mobility   Outcome Measure  Help needed turning from your back to your side while in a flat bed without using bedrails?: A Little Help needed moving from lying on your back to sitting on the side of a flat bed without using bedrails?: A Little Help needed moving to and from a bed to a chair (including a wheelchair)?: None Help needed standing up from a chair using your arms (e.g., wheelchair or bedside chair)?: None Help needed to walk in hospital room?: None Help needed climbing 3-5 steps with a railing? : A Little 6 Click Score: 21    End of Session Equipment Utilized During Treatment: Gait belt;Back brace Activity Tolerance: Patient tolerated treatment well Patient left: in chair;with call bell/phone within reach;with family/visitor present Nurse Communication: Mobility status PT Visit Diagnosis: Other abnormalities of gait and mobility (R26.89)     Time: [id number] PT Time Calculation (min) (ACUTE ONLY): 21 min  Charges:  $Gait Training: 8-22 mins                     Anastasio Champion, DPT  Acute Rehabilitation Services Pager [phone] Office [place] 02/23/2020, 9:11 AM

## 2020-02-23 NOTE — Plan of Care (Signed)

## 2020-02-23 NOTE — Progress Notes (Signed)
Pt was wheeled down by NT for discharge home; in no acute distress, no complaints of pain nor discomfort; belongings and equipments brought along by daughter; discharge instructions given by RN; pt and her daughter verbalized understanding.

## 2020-02-28 ENCOUNTER — Encounter: Payer: Self-pay | Admitting: *Deleted

## 2020-03-14 ENCOUNTER — Other Ambulatory Visit: Payer: Self-pay

## 2020-03-14 ENCOUNTER — Ambulatory Visit (HOSPITAL_COMMUNITY)
Admission: RE | Admit: 2020-03-14 | Discharge: 2020-03-14 | Disposition: A | Discharge status: Home / Self Care | Payer: Medicare Other | Source: Ambulatory Visit | Attending: Vascular Surgery | Admitting: Vascular Surgery

## 2020-03-14 ENCOUNTER — Other Ambulatory Visit (HOSPITAL_COMMUNITY): Payer: Self-pay | Admitting: Student

## 2020-03-14 DIAGNOSIS — R600 Localized edema: Secondary | ICD-10-CM

## 2020-04-09 ENCOUNTER — Ambulatory Visit: Payer: Medicare Other | Admitting: Neurology

## 2020-04-18 ENCOUNTER — Ambulatory Visit: Payer: Medicare Other | Admitting: Neurology

## 2020-04-19 ENCOUNTER — Other Ambulatory Visit: Payer: Self-pay | Admitting: Neurology

## 2020-04-28 ENCOUNTER — Other Ambulatory Visit: Payer: Self-pay | Admitting: Neurology

## 2020-04-30 ENCOUNTER — Other Ambulatory Visit: Payer: Self-pay | Admitting: Neurology

## 2020-04-30 NOTE — Telephone Encounter (Signed)
Reviewed pt chart. Appears pt had Left L3-L4 anterior lateral retroperitoneal interbody decompression on 02/22/20.  Received Oxycodone-Acetaminophn 7.5-325 #20 on 02/22/20 from Glenford Peers at [facility]. Received tramadol 50mg  #54 from Viona Gilmore at [facility] on 02/26/20 and 03/14/20.   Last saw Dr. Felecia Shelling 08/21/2019 and next f/u 06/11/20

## 2020-05-09 ENCOUNTER — Encounter (HOSPITAL_BASED_OUTPATIENT_CLINIC_OR_DEPARTMENT_OTHER): Payer: Medicare Other | Attending: Internal Medicine | Admitting: Physician Assistant

## 2020-05-09 DIAGNOSIS — G35 Multiple sclerosis: Secondary | ICD-10-CM | POA: Diagnosis not present

## 2020-05-09 DIAGNOSIS — Z87891 Personal history of nicotine dependence: Secondary | ICD-10-CM | POA: Insufficient documentation

## 2020-05-09 DIAGNOSIS — L89313 Pressure ulcer of right buttock, stage 3: Secondary | ICD-10-CM | POA: Diagnosis present

## 2020-05-09 DIAGNOSIS — E119 Type 2 diabetes mellitus without complications: Secondary | ICD-10-CM | POA: Insufficient documentation

## 2020-05-09 DIAGNOSIS — I10 Essential (primary) hypertension: Secondary | ICD-10-CM | POA: Insufficient documentation

## 2020-05-09 DIAGNOSIS — I471 Supraventricular tachycardia: Secondary | ICD-10-CM | POA: Diagnosis not present

## 2020-05-09 NOTE — Progress Notes (Signed)
Jocelyn Wilson, Jocelyn Wilson ([id number]) Visit Report for 05/09/2020 Chief Complaint Document Details Patient Name: Date of Service: [place], [place] M. 05/09/2020 1:15 PM Medical Record Number: [id number] Patient Account Number: [id number] Date of Birth/Sex: Treating RN: [birth year] (69 y.o. Jocelyn Wilson Primary Care Provider: Aretta Nip Other Clinician: Referring Provider: Treating Provider/Extender: Luella Cook, Flonnie Overman in Treatment: 0 Information Obtained from: Patient Chief Complaint Right gluteal ulcer Electronic Signature(s) Signed: 05/09/2020 2:56:45 PM By: Worthy Keeler PA-C Entered By: Worthy Keeler on 05/09/2020 14:56:45 -------------------------------------------------------------------------------- Debridement Details Patient Name: Date of Service: Jocelyn Wilson NN, [name] 05/09/2020 1:15 PM Medical Record Number: [id number] Patient Account Number: [id number] Date of Birth/Sex: Treating RN: September 04, 1951 (69 y.o. Jocelyn Wilson Primary Care Provider: Aretta Nip Other Clinician: Referring Provider: Treating Provider/Extender: Otis Brace Weeks in Treatment: 0 Debridement Performed for Assessment: Wound #1 Right Gluteus Performed By: Physician Worthy Keeler, PA Debridement Type: Debridement Level of Consciousness (Pre-procedure): Awake and Alert Pre-procedure Verification/Time Out Yes - 15:05 Taken: Start Time: 15:05 Pain Control: Other : benzocaine, 20% T Area Debrided (L x W): otal 0.5 (cm) x 0.2 (cm) = 0.1 (cm) Tissue and other material debrided: Viable, Non-Viable, Subcutaneous, Skin: Epidermis, Fibrin/Exudate Level: Skin/Subcutaneous Tissue Debridement Description: Excisional Instrument: Curette Bleeding: Minimum Hemostasis Achieved: Pressure End Time: 15:06 Procedural Pain: 0 Post Procedural Pain: 0 Response to Treatment: Procedure was tolerated well Level of Consciousness (Post- Awake  and Alert procedure): Post Debridement Measurements of Total Wound Length: (cm) 0.5 Stage: Category/Stage III Width: (cm) 0.2 Depth: (cm) 0.2 Volume: (cm) 0.016 Character of Wound/Ulcer Post Debridement: Improved Post Procedure Diagnosis Same as Pre-procedure Electronic Signature(s) Signed: 05/09/2020 5:02:39 PM By: Kela Millin Signed: 05/09/2020 5:19:16 PM By: Worthy Keeler PA-C Entered By: Kela Millin on 05/09/2020 15:18:25 -------------------------------------------------------------------------------- HPI Details Patient Name: Date of Service: Jocelyn Lye NN, [name] 05/09/2020 1:15 PM Medical Record Number: [id number] Patient Account Number: [id number] Date of Birth/Sex: Treating RN: August 16, 1951 (69 y.o. Jocelyn Wilson Primary Care Provider: Aretta Nip Other Clinician: Referring Provider: Treating Provider/Extender: Cloyd Stagers in Treatment: 0 History of Present Illness HPI Description: 05/09/2020 on evaluation today patient presents for initial evaluation here in our clinic concerning issues that she has been having with a wound on the right gluteal region. This occurred following her back surgery which was somewhere around March 26, 2020 at least when she noticed the wound. She had been sitting much more frequently due to the fact that following the back surgery she was not as mobile. Nonetheless she tells me at this time that fortunately she is getting around a little better though she still cannot stand for extremely long periods of time. She is still within the timeframe of hopefully seeing improvement from her back surgery which is good news. Fortunately there is no evidence of active infection at this time. Her surgical site has completely resolved and looks like it is doing quite well. She is going require some sharp debridement based on what I am seeing today. The patient does have a history of supraventricular  tachycardia, diabetes mellitus type 2, and multiple sclerosis. Electronic Signature(s) Signed: 05/09/2020 3:22:21 PM By: Worthy Keeler PA-C Entered By: Worthy Keeler on 05/09/2020 15:22:20 -------------------------------------------------------------------------------- Physical Exam Details Patient Name: Date of Service: Jocelyn UMA NN, [name] 05/09/2020 1:15 PM Medical Record Number: [id number] Patient Account Number: [id number] Date of Birth/Sex: Treating  RN: 04-Jan-1951 (69 y.o. Jocelyn Wilson Primary Care Provider: Aretta Nip Other Clinician: Referring Provider: Treating Provider/Extender: Otis Brace Weeks in Treatment: 0 Constitutional sitting or standing blood pressure is within target range for patient.. pulse regular and within target range for patient.Marland Kitchen respirations regular, non-labored and within target range for patient.Marland Kitchen temperature within target range for patient.. Well-nourished and well-hydrated in no acute distress. Eyes conjunctiva clear no eyelid edema noted. pupils equal round and reactive to light and accommodation. Ears, Nose, Mouth, and Throat no gross abnormality of ear auricles or external auditory canals. normal hearing noted during conversation. mucus membranes moist. Respiratory normal breathing without difficulty. Cardiovascular no clubbing, cyanosis, significant edema, <3 sec cap refill. Musculoskeletal Patient unable to walk without assistance Secondary to her back issues as well as to symptomatic multiple sclerosis.Marland Kitchen no significant deformity or arthritic changes, no loss or range of motion, no clubbing. Psychiatric this patient is able to make decisions and demonstrates good insight into disease process. Alert and Oriented x 3. pleasant and cooperative. Notes Upon inspection patient's wound bed actually showed signs of being a fairly superficial stage III pressure ulcer. Fortunately there is no signs of infection  and though this is going require some sharp debridement to remove some of the dry skin and necrotic debris from the surface of the wound I think overall she seems to be doing quite well with this since in fact already made some improvements. I did perform sharp debridement utilizing a #3 curette and she tolerated that with only minimal discomfort post debridement wound bed appears to be doing significantly better. Electronic Signature(s) Signed: 05/09/2020 3:23:14 PM By: Worthy Keeler PA-C Entered By: Worthy Keeler on 05/09/2020 15:23:13 -------------------------------------------------------------------------------- Physician Orders Details Patient Name: Date of Service: Jocelyn UMA NN, [name] 05/09/2020 1:15 PM Medical Record Number: [id number] Patient Account Number: [id number] Date of Birth/Sex: Treating RN: 06-Mar-1951 (69 y.o. Jocelyn Wilson Primary Care Provider: Aretta Nip Other Clinician: Referring Provider: Treating Provider/Extender: Cloyd Stagers in Treatment: 0 Verbal / Phone Orders: No Diagnosis Coding ICD-10 Coding Code Description [phone] Pressure ulcer of right buttock, stage 2 I47.1 Supraventricular tachycardia E11.622 Type 2 diabetes mellitus with other skin ulcer G35 Multiple sclerosis Follow-up Appointments Return Appointment in 2 weeks. Dressing Change Frequency Change Dressing every other day. Wound Cleansing May shower and wash wound with soap and water. Primary Wound Dressing Silver Collagen - moisten with normal saline Secondary Dressing Foam Border Off-Loading Turn and reposition every 2 hours Other: - use memory foam cushion to support Electronic Signature(s) Signed: 05/09/2020 5:02:39 PM By: Kela Millin Signed: 05/09/2020 5:19:16 PM By: Worthy Keeler PA-C Entered By: Kela Millin on 05/09/2020 15:11:02 -------------------------------------------------------------------------------- Problem  List Details Patient Name: Date of Service: Jocelyn Lye NN, [name] 05/09/2020 1:15 PM Medical Record Number: [id number] Patient Account Number: [id number] Date of Birth/Sex: Treating RN: 1951/09/19 (69 y.o. Jocelyn Wilson Primary Care Provider: Aretta Nip Other Clinician: Referring Provider: Treating Provider/Extender: Otis Brace Weeks in Treatment: 0 Active Problems ICD-10 Encounter Code Description Active Date MDM Diagnosis L89.313 Pressure ulcer of right buttock, stage 3 05/09/2020 No Yes I47.1 Supraventricular tachycardia 05/09/2020 No Yes E11.622 Type 2 diabetes mellitus with other skin ulcer 05/09/2020 No Yes G35 Multiple sclerosis 05/09/2020 No Yes Inactive Problems Resolved Problems Electronic Signature(s) Signed: 05/09/2020 3:20:36 PM By: Worthy Keeler PA-C Previous Signature: 05/09/2020 2:56:21 PM Version By: Worthy Keeler  PA-C Entered By: Worthy Keeler on 05/09/2020 15:20:35 -------------------------------------------------------------------------------- Progress Note Details Patient Name: Date of Service: Jocelyn Wilson, [place] RSHA M. 05/09/2020 1:15 PM Medical Record Number: [id number] Patient Account Number: [id number] Date of Birth/Sex: Treating RN: [birth year] (69 y.o. Jocelyn Wilson Primary Care Provider: Aretta Nip Other Clinician: Referring Provider: Treating Provider/Extender: Cloyd Stagers in Treatment: 0 Subjective Chief Complaint Information obtained from Patient Right gluteal ulcer History of Present Illness (HPI) 05/09/2020 on evaluation today patient presents for initial evaluation here in our clinic concerning issues that she has been having with a wound on the right gluteal region. This occurred following her back surgery which was somewhere around March 26, 2020 at least when she noticed the wound. She had been sitting much more frequently due to the fact that following the  back surgery she was not as mobile. Nonetheless she tells me at this time that fortunately she is getting around a little better though she still cannot stand for extremely long periods of time. She is still within the timeframe of hopefully seeing improvement from her back surgery which is good news. Fortunately there is no evidence of active infection at this time. Her surgical site has completely resolved and looks like it is doing quite well. She is going require some sharp debridement based on what I am seeing today. The patient does have a history of supraventricular tachycardia, diabetes mellitus type 2, and multiple sclerosis. Patient History Information obtained from Patient. Allergies Cipro, Iodinated Contrast Media, Sulfa (Sulfonamide Antibiotics), adhesive tape, iron dextran complex (Severity: Severe) Family History Cancer - Father,Siblings,Mother, Diabetes - Father, Heart Disease - Father, Hypertension - Father, No family history of Hereditary Spherocytosis, Kidney Disease, Lung Disease, Seizures, Stroke, Thyroid Problems, Tuberculosis. Social History Former smoker, Marital Status - Married, Alcohol Use - Never, Drug Use - No History, Caffeine Use - Daily. Medical History Eyes Denies history of Cataracts, Glaucoma, Optic Neuritis Ear/Nose/Mouth/Throat Denies history of Chronic sinus problems/congestion, Middle ear problems Hematologic/Lymphatic Denies history of Anemia, Hemophilia, Human Immunodeficiency Virus, Lymphedema, Sickle Cell Disease Respiratory Denies history of Aspiration, Asthma, Chronic Obstructive Pulmonary Disease (COPD), Pneumothorax, Sleep Apnea, Tuberculosis Cardiovascular Patient has history of Hypertension Denies history of Angina, Arrhythmia, Congestive Heart Failure, Coronary Artery Disease, Deep Vein Thrombosis, Hypotension, Myocardial Infarction, Peripheral Arterial Disease, Peripheral Venous Disease, Phlebitis, Vasculitis Gastrointestinal Denies  history of Cirrhosis , Colitis, Crohnoos, Hepatitis A, Hepatitis B, Hepatitis C Endocrine Patient has history of Type II Diabetes Denies history of Type I Diabetes Genitourinary Denies history of End Stage Renal Disease Immunological Denies history of Lupus Erythematosus, Raynaudoos, Scleroderma Integumentary (Skin) Denies history of History of Burn Musculoskeletal Denies history of Gout, Rheumatoid Arthritis, Osteoarthritis, Osteomyelitis Neurologic Denies history of Dementia, Neuropathy, Quadriplegia, Paraplegia, Seizure Disorder Oncologic Denies history of Received Chemotherapy, Received Radiation Psychiatric Denies history of Anorexia/bulimia, Confinement Anxiety Patient is treated with Oral Agents. Blood sugar is not tested. Review of Systems (ROS) Constitutional Symptoms (General Health) Denies complaints or symptoms of Fatigue, Fever, Chills, Marked Weight Change. Eyes Denies complaints or symptoms of Dry Eyes, Vision Changes, Glasses / Contacts. Ear/Nose/Mouth/Throat Denies complaints or symptoms of Chronic sinus problems or rhinitis. Respiratory Denies complaints or symptoms of Chronic or frequent coughs, Shortness of Breath. Cardiovascular Denies complaints or symptoms of Chest pain. Gastrointestinal Denies complaints or symptoms of Frequent diarrhea, Nausea, Vomiting. Endocrine Denies complaints or symptoms of Heat/cold intolerance. Genitourinary Denies complaints or symptoms of Frequent urination. Integumentary (Skin) Complains or has symptoms of Wounds.  Musculoskeletal Denies complaints or symptoms of Muscle Pain, Muscle Weakness. Neurologic Denies complaints or symptoms of Numbness/parasthesias. Psychiatric Denies complaints or symptoms of Claustrophobia, Suicidal. Objective Constitutional sitting or standing blood pressure is within target range for patient.. pulse regular and within target range for patient.Marland Kitchen respirations regular, non-labored and  within target range for patient.Marland Kitchen temperature within target range for patient.. Well-nourished and well-hydrated in no acute distress. Vitals Time Taken: 2:05 PM, Height: 58 in, Source: Stated, Weight: 232 lbs, Source: Stated, BMI: 48.5, Temperature: 98.5 F, Pulse: 99 bpm, Respiratory Rate: 18 breaths/min, Blood Pressure: 134/74 mmHg. Eyes conjunctiva clear no eyelid edema noted. pupils equal round and reactive to light and accommodation. Ears, Nose, Mouth, and Throat no gross abnormality of ear auricles or external auditory canals. normal hearing noted during conversation. mucus membranes moist. Respiratory normal breathing without difficulty. Cardiovascular no clubbing, cyanosis, significant edema, Musculoskeletal Patient unable to walk without assistance Secondary to her back issues as well as to symptomatic multiple sclerosis.Marland Kitchen no significant deformity or arthritic changes, no loss or range of motion, no clubbing. Psychiatric this patient is able to make decisions and demonstrates good insight into disease process. Alert and Oriented x 3. pleasant and cooperative. General Notes: Upon inspection patient's wound bed actually showed signs of being a fairly superficial stage III pressure ulcer. Fortunately there is no signs of infection and though this is going require some sharp debridement to remove some of the dry skin and necrotic debris from the surface of the wound I think overall she seems to be doing quite well with this since in fact already made some improvements. I did perform sharp debridement utilizing a #3 curette and she tolerated that with only minimal discomfort post debridement wound bed appears to be doing significantly better. Integumentary (Hair, Skin) Wound #1 status is Open. Original cause of wound was Gradually Appeared. The wound is located on the Right Gluteus. The wound measures 0.5cm length x 0.2cm width x 0.2cm depth; 0.079cm^2 area and 0.016cm^3 volume. There is  Fat Layer (Subcutaneous Tissue) Exposed exposed. There is no tunneling or undermining noted. There is a medium amount of serosanguineous drainage noted. The wound margin is distinct with the outline attached to the wound base. There is large (67-100%) pink, pale granulation within the wound bed. There is a small (1-33%) amount of necrotic tissue within the wound bed including Adherent Slough. Assessment Active Problems ICD-10 Pressure ulcer of right buttock, stage 3 Supraventricular tachycardia Type 2 diabetes mellitus with other skin ulcer Multiple sclerosis Procedures Wound #1 Pre-procedure diagnosis of Wound #1 is a Pressure Ulcer located on the Right Gluteus . There was a Excisional Skin/Subcutaneous Tissue Debridement with a total area of 0.1 sq cm performed by Worthy Keeler, PA. With the following instrument(s): Curette to remove Viable and Non-Viable tissue/material. Material removed includes Subcutaneous Tissue, Skin: Epidermis, and Fibrin/Exudate after achieving pain control using Other (benzocaine, 20%). No specimens were taken. A time out was conducted at 15:05, prior to the start of the procedure. A Minimum amount of bleeding was controlled with Pressure. The procedure was tolerated well with a pain level of 0 throughout and a pain level of 0 following the procedure. Post Debridement Measurements: 0.5cm length x 0.2cm width x 0.2cm depth; 0.016cm^3 volume. Post debridement Stage noted as Category/Stage III. Character of Wound/Ulcer Post Debridement is improved. Post procedure Diagnosis Wound #1: Same as Pre-Procedure Plan Follow-up Appointments: Return Appointment in 2 weeks. Dressing Change Frequency: Change Dressing every other day. Wound Cleansing: May shower  and wash wound with soap and water. Primary Wound Dressing: Silver Collagen - moisten with normal saline Secondary Dressing: Foam Border Off-Loading: Turn and reposition every 2 hours Other: - use memory foam  cushion to support 1. I would recommend currently that we go ahead and initiate treatment with a initiation of silver collagen I think this will be a good option for her. 2. I am also can recommend that we go ahead and utilize a border foam dressing to cover this will both protect as well as help keep the area hopefully moisture contained. 3. I am also getting suggest that she needs to make sure not sit in any 1 position for too long a period of time. I do think that she may benefit from a memory foam cushion. I do not like the idea of a donut pillow as that really does not seem to do well for most patients. We will see patient back for reevaluation in 2 weeks here in the clinic. If anything worsens or changes patient will contact our office for additional recommendations. Electronic Signature(s) Signed: 05/09/2020 3:24:05 PM By: Worthy Keeler PA-C Entered By: Worthy Keeler on 05/09/2020 15:24:05 -------------------------------------------------------------------------------- HxROS Details Patient Name: Date of Service: Jocelyn UMA NN, [name] 05/09/2020 1:15 PM Medical Record Number: [id number] Patient Account Number: [id number] Date of Birth/Sex: Treating RN: 22-May-1951 (69 y.o. Orvan Falconer Primary Care Provider: Aretta Nip Other Clinician: Referring Provider: Treating Provider/Extender: Otis Brace Weeks in Treatment: 0 Information Obtained From Patient Constitutional Symptoms (General Health) Complaints and Symptoms: Negative for: Fatigue; Fever; Chills; Marked Weight Change Eyes Complaints and Symptoms: Negative for: Dry Eyes; Vision Changes; Glasses / Contacts Medical History: Negative for: Cataracts; Glaucoma; Optic Neuritis Ear/Nose/Mouth/Throat Complaints and Symptoms: Negative for: Chronic sinus problems or rhinitis Medical History: Negative for: Chronic sinus problems/congestion; Middle ear problems Respiratory Complaints and  Symptoms: Negative for: Chronic or frequent coughs; Shortness of Breath Medical History: Negative for: Aspiration; Asthma; Chronic Obstructive Pulmonary Disease (COPD); Pneumothorax; Sleep Apnea; Tuberculosis Cardiovascular Complaints and Symptoms: Negative for: Chest pain Medical History: Positive for: Hypertension Negative for: Angina; Arrhythmia; Congestive Heart Failure; Coronary Artery Disease; Deep Vein Thrombosis; Hypotension; Myocardial Infarction; Peripheral Arterial Disease; Peripheral Venous Disease; Phlebitis; Vasculitis Gastrointestinal Complaints and Symptoms: Negative for: Frequent diarrhea; Nausea; Vomiting Medical History: Negative for: Cirrhosis ; Colitis; Crohns; Hepatitis A; Hepatitis B; Hepatitis C Endocrine Complaints and Symptoms: Negative for: Heat/cold intolerance Medical History: Positive for: Type II Diabetes Negative for: Type I Diabetes Time with diabetes: 5 years Treated with: Oral agents Blood sugar tested every day: No Genitourinary Complaints and Symptoms: Negative for: Frequent urination Medical History: Negative for: End Stage Renal Disease Integumentary (Skin) Complaints and Symptoms: Positive for: Wounds Medical History: Negative for: History of Burn Musculoskeletal Complaints and Symptoms: Negative for: Muscle Pain; Muscle Weakness Medical History: Negative for: Gout; Rheumatoid Arthritis; Osteoarthritis; Osteomyelitis Neurologic Complaints and Symptoms: Negative for: Numbness/parasthesias Medical History: Negative for: Dementia; Neuropathy; Quadriplegia; Paraplegia; Seizure Disorder Psychiatric Complaints and Symptoms: Negative for: Claustrophobia; Suicidal Medical History: Negative for: Anorexia/bulimia; Confinement Anxiety Hematologic/Lymphatic Medical History: Negative for: Anemia; Hemophilia; Human Immunodeficiency Virus; Lymphedema; Sickle Cell Disease Immunological Medical History: Negative for: Lupus Erythematosus;  Raynauds; Scleroderma Oncologic Medical History: Negative for: Received Chemotherapy; Received Radiation Immunizations Pneumococcal Vaccine: Received Pneumococcal Vaccination: No Implantable Devices None Family and Social History Cancer: Yes - Father,Siblings,Mother; Diabetes: Yes - Father; Heart Disease: Yes - Father; Hereditary Spherocytosis: No; Hypertension: Yes - Father; Kidney Disease: No; Lung Disease:  No; Seizures: No; Stroke: No; Thyroid Problems: No; Tuberculosis: No; Former smoker; Marital Status - Married; Alcohol Use: Never; Drug Use: No History; Caffeine Use: Daily Electronic Signature(s) Signed: 05/09/2020 4:58:30 PM By: Carlene Coria RN Signed: 05/09/2020 5:19:16 PM By: Worthy Keeler PA-C Entered By: Carlene Coria on 05/09/2020 14:13:21 -------------------------------------------------------------------------------- SuperBill Details Patient Name: Date of Service: Jocelyn Lye NN, [name] 05/09/2020 Medical Record Number: [id number] Patient Account Number: [id number] Date of Birth/Sex: Treating RN: Feb 15, 1951 (69 y.o. Jocelyn Wilson Primary Care Provider: Aretta Nip Other Clinician: Referring Provider: Treating Provider/Extender: Otis Brace Weeks in Treatment: 0 Diagnosis Coding ICD-10 Codes Code Description [phone] Pressure ulcer of right buttock, stage 3 I47.1 Supraventricular tachycardia E11.622 Type 2 diabetes mellitus with other skin ulcer G35 Multiple sclerosis Facility Procedures CPT4 Code: [id number] Description: [place] VISIT-LEV 3 EST PT Modifier: 25 Quantity: 1 CPT4 Code: [id number] Description: 19622 - DEB SUBQ TISSUE 20 SQ CM/< ICD-10 Diagnosis Description L89.313 Pressure ulcer of right buttock, stage 3 Modifier: Quantity: 1 Physician Procedures : CPT4 Code Description Modifier [id number] WC PHYS LEVEL 3 NEW PT 25 ICD-10 Diagnosis Description L89.313 Pressure ulcer of right buttock, stage 3 I47.1  Supraventricular tachycardia E11.622 Type 2 diabetes mellitus with other skin ulcer G35 Multiple  sclerosis Quantity: 1 : [id number] - WC PHYS SUBQ TISS 20 SQ CM ICD-10 Diagnosis Description L89.313 Pressure ulcer of right buttock, stage 3 Quantity: 1 Electronic Signature(s) Signed: 05/09/2020 3:28:02 PM By: Worthy Keeler PA-C Entered By: Worthy Keeler on 05/09/2020 15:28:02

## 2020-05-09 NOTE — Progress Notes (Signed)
[name], [name] ([id number]) Visit Report for 05/09/2020 Allergy List Details Patient Name: Date of Service: Wilson, [name] 05/09/2020 1:15 PM Medical Record Number: [id number] Patient Account Number: [id number] Date of Birth/Sex: Treating RN: 1951-05-31 (68 y.o. Orvan Falconer Primary Care Daphyne Miguez: Aretta Nip Other Clinician: Referring Hancel Ion: Treating Adhrit Krenz/Extender: Luella Cook, [place] R Weeks in Treatment: 0 Allergies Active Allergies Cipro Iodinated Contrast Media Sulfa (Sulfonamide Antibiotics) adhesive tape iron dextran complex Severity: Severe Allergy Notes Electronic Signature(s) Signed: 05/09/2020 4:58:30 PM By: Carlene Coria RN Entered By: Carlene Coria on 05/09/2020 14:45:44 -------------------------------------------------------------------------------- Arrival Information Details Patient Name: Date of Service: Jocelyn Lye NN, [name] 05/09/2020 1:15 PM Medical Record Number: [id number] Patient Account Number: [id number] Date of Birth/Sex: Treating RN: [birth year] (69 y.o. Orvan Falconer Primary Care Aiyden Lauderback: Aretta Nip Other Clinician: Referring Dakarai Mcglocklin: Treating Shivali Quackenbush/Extender: Luella Cook, Flonnie Overman in Treatment: 0 Visit Information Patient Arrived: Walker Arrival Time: 13:49 Accompanied By: daughter Transfer Assistance: None Patient Identification Verified: Yes Secondary Verification Process Completed: Yes Patient Requires Transmission-Based Precautions: No Patient Has Alerts: No Electronic Signature(s) Signed: 05/09/2020 4:58:30 PM By: Carlene Coria RN Entered By: Carlene Coria on 05/09/2020 14:05:17 -------------------------------------------------------------------------------- Clinic Level of Care Assessment Details Patient Name: Date of Service: Jocelyn Wilson, [place] RSHA M. 05/09/2020 1:15 PM Medical Record Number: [id number] Patient Account Number: [id number] Date of Birth/Sex: Treating  RN: 26-Dec-1950 (69 y.o. [name] Primary Care Betzabe Bevans: Aretta Nip Other Clinician: Referring [place] Merida: Treating Leiann Sporer/Extender: Otis Brace Weeks in Treatment: 0 Clinic Level of Care Assessment Items TOOL 1 Quantity Score X- 1 0 Use when EandM and Procedure is performed on INITIAL visit ASSESSMENTS - Nursing Assessment / Reassessment X- 1 20 General Physical Exam (combine w/ comprehensive assessment (listed just below) when performed on new pt. evals) X- 1 25 Comprehensive Assessment (HX, ROS, Risk Assessments, Wounds Hx, etc.) ASSESSMENTS - Wound and Skin Assessment / Reassessment []  - 0 Dermatologic / Skin Assessment (not related to wound area) ASSESSMENTS - Ostomy and/or Continence Assessment and Care []  - 0 Incontinence Assessment and Management []  - 0 Ostomy Care Assessment and Management (repouching, etc.) PROCESS - Coordination of Care X - Simple Patient / Family Education for ongoing care 1 15 []  - 0 Complex (extensive) Patient / Family Education for ongoing care X- 1 10 Staff obtains Programmer, systems, Records, T Results / Process Orders est []  - 0 Staff telephones HHA, Nursing Homes / Clarify orders / etc []  - 0 Routine Transfer to another Facility (non-emergent condition) []  - 0 Routine Hospital Admission (non-emergent condition) X- 1 15 New Admissions / Biomedical engineer / Ordering NPWT Apligraf, etc. , []  - 0 Emergency Hospital Admission (emergent condition) PROCESS - Special Needs []  - 0 Pediatric / Minor Patient Management []  - 0 Isolation Patient Management []  - 0 Hearing / Language / Visual special needs []  - 0 Assessment of Community assistance (transportation, D/C planning, etc.) []  - 0 Additional assistance / Altered mentation []  - 0 Support Surface(s) Assessment (bed, cushion, seat, etc.) INTERVENTIONS - Miscellaneous []  - 0 External ear exam []  - 0 Patient Transfer (multiple staff / Librarian, academic / Similar devices) []  - 0 Simple Staple / Suture removal (25 or less) []  - 0 Complex Staple / Suture removal (26 or more) []  - 0 Hypo/Hyperglycemic Management (do not check if billed separately) []  - 0 Ankle / Brachial Index (ABI) - do not check if billed  separately Has the patient been seen at the hospital within the last three years: Yes Total Score: 85 Level Of Care: New/Established - Level 3 Electronic Signature(s) Signed: 05/09/2020 5:02:39 PM By: Kela Millin Signed: 05/09/2020 5:02:39 PM By: Kela Millin Entered By: Kela Millin on 05/09/2020 15:24:36 -------------------------------------------------------------------------------- Multi-Disciplinary Care Plan Details Patient Name: Date of Service: Jocelyn Lye NN, [name] 05/09/2020 1:15 PM Medical Record Number: [id number] Patient Account Number: [id number] Date of Birth/Sex: Treating RN: 10/17/1950 (69 y.o. [name] Primary Care Rune Mendez: Aretta Nip Other Clinician: Referring Khali Perella: Treating Deeana Atwater/Extender: Luella Cook, Flonnie Overman in Treatment: 0 Active Inactive Orientation to the Wound Care Program Nursing Diagnoses: Knowledge deficit related to the wound healing center program Goals: Patient/caregiver will verbalize understanding of the [place] Program Date Initiated: 05/09/2020 Target Resolution Date: 06/06/2020 Goal Status: Active Interventions: Provide education on orientation to the wound center Notes: Pressure Nursing Diagnoses: Knowledge deficit related to causes and risk factors for pressure ulcer development Goals: Patient/caregiver will verbalize risk factors for pressure ulcer development Date Initiated: 05/09/2020 Target Resolution Date: 06/06/2020 Goal Status: Active Interventions: Provide education on pressure ulcers Notes: Wound/Skin Impairment Nursing Diagnoses: Impaired tissue integrity Goals: Ulcer/skin breakdown  will have a volume reduction of 30% by week 4 Date Initiated: 05/09/2020 Target Resolution Date: 06/06/2020 Goal Status: Active Interventions: Provide education on ulcer and skin care Notes: Electronic Signature(s) Signed: 05/09/2020 5:02:39 PM By: Kela Millin Entered By: Kela Millin on 05/09/2020 15:04:42 -------------------------------------------------------------------------------- Pain Assessment Details Patient Name: Date of Service: Jocelyn Lye NN, [name] 05/09/2020 1:15 PM Medical Record Number: [id number] Patient Account Number: [id number] Date of Birth/Sex: Treating RN: [birth year] (69 y.o. Orvan Falconer Primary Care Marielena Harvell: Aretta Nip Other Clinician: Referring Shirlee Whitmire: Treating Roczen Waymire/Extender: Otis Brace Weeks in Treatment: 0 Active Problems Location of Pain Severity and Description of Pain Patient Has Paino Yes Site Locations With Dressing Change: Yes Duration of the Pain. Constant / Intermittento Intermittent How Long Does it Lasto Hours: Minutes: 10 Rate the pain. Current Pain Level: 2 Worst Pain Level: 3 Least Pain Level: 0 Tolerable Pain Level: 5 Character of Pain Describe the Pain: Burning, Other: stinging Pain Management and Medication Current Pain Management: Medication: No Cold Application: No Rest: Yes Massage: No Activity: No T.E.N.S.: No Heat Application: No Leg drop or elevation: No Is the Current Pain Management Adequate: Inadequate How does your wound impact your activities of daily livingo Sleep: No Bathing: No Appetite: No Relationship With Others: No Bladder Continence: No Emotions: No Bowel Continence: No Work: No Toileting: No Drive: No Dressing: No Hobbies: No Electronic Signature(s) Signed: 05/09/2020 4:58:30 PM By: Carlene Coria RN Entered By: Carlene Coria on 05/09/2020  14:21:03 -------------------------------------------------------------------------------- Patient/Caregiver Education Details Patient Name: Date of Service: Jocelyn Lye NN, [name] 7/30/2021andnbsp1:15 PM Medical Record Number: [id number] Patient Account Number: [id number] Date of Birth/Gender: Treating RN: 1951/09/25 (69 y.o. [name] Primary Care Physician: Aretta Nip Other Clinician: Referring Physician: Treating Physician/Extender: Luella Cook, Flonnie Overman in Treatment: 0 Education Assessment Education Provided To: Patient Education Topics Provided Pressure: Handouts: Pressure Ulcers: Care and Offloading Methods: Explain/Verbal Responses: State content correctly [employer]: o Handouts: Welcome T [facility] o Methods: Explain/Verbal Responses: State content correctly Wound/Skin Impairment: Handouts: Caring for Your Ulcer Methods: Explain/Verbal Responses: State content correctly Electronic Signature(s) Signed: 05/09/2020 5:02:39 PM By: Kela Millin Entered By: Kela Millin on 05/09/2020 15:05:01 --------------------------------------------------------------------------------  Wound Assessment Details Patient Name: Date of Service: Jocelyn Wilson, [name] 05/09/2020 1:15 PM Medical Record Number: [id number] Patient Account Number: [id number] Date of Birth/Sex: Treating RN: 1950/12/13 (69 y.o. Orvan Falconer Primary Care Ramonia Mcclaran: Aretta Nip Other Clinician: Referring Ameris Akamine: Treating Shiori Adcox/Extender: Otis Brace Weeks in Treatment: 0 Wound Status Wound Number: 1 Primary Etiology: Pressure Ulcer Wound Location: Right Gluteus Wound Status: Open Wounding Event: Gradually Appeared Comorbid History: Hypertension, Type II Diabetes Date Acquired: 03/26/2020 Weeks Of Treatment: 0 Clustered Wound: No Wound Measurements Length: (cm) 0.5 Width: (cm)  0.2 Depth: (cm) 0.2 Area: (cm) 0.079 Volume: (cm) 0.016 % Reduction in Area: 0% % Reduction in Volume: 0% Epithelialization: None Tunneling: No Undermining: No Wound Description Classification: Category/Stage III Wound Margin: Distinct, outline attached Exudate Amount: Medium Exudate Type: Serosanguineous Exudate Color: red, brown Foul Odor After Cleansing: No Slough/Fibrino Yes Wound Bed Granulation Amount: Large (67-100%) Exposed Structure Granulation Quality: Pink, Pale Fascia Exposed: No Necrotic Amount: Small (1-33%) Fat Layer (Subcutaneous Tissue) Exposed: Yes Necrotic Quality: Adherent Slough Tendon Exposed: No Muscle Exposed: No Joint Exposed: No Bone Exposed: No Electronic Signature(s) Signed: 05/09/2020 4:58:30 PM By: Carlene Coria RN Signed: 05/09/2020 5:02:39 PM By: Kela Millin Entered By: Kela Millin on 05/09/2020 15:17:55 -------------------------------------------------------------------------------- Vitals Details Patient Name: Date of Service: Jocelyn Lye NN, [name] 05/09/2020 1:15 PM Medical Record Number: [id number] Patient Account Number: [id number] Date of Birth/Sex: Treating RN: [birth year] (69 y.o. Orvan Falconer Primary Care Yaretzy Olazabal: Aretta Nip Other Clinician: Referring Brycen Bean: Treating Ardie Mclennan/Extender: Otis Brace Weeks in Treatment: 0 Vital Signs Time Taken: 14:05 Temperature (F): 98.5 Height (in): 58 Pulse (bpm): 99 Source: Stated Respiratory Rate (breaths/min): 18 Weight (lbs): 232 Blood Pressure (mmHg): 134/74 Source: Stated Reference Range: 80 - 120 mg / dl Body Mass Index (BMI): 48.5 Electronic Signature(s) Signed: 05/09/2020 4:58:30 PM By: Carlene Coria RN Entered By: Carlene Coria on 05/09/2020 14:06:00

## 2020-05-09 NOTE — Progress Notes (Signed)
ALDEEN, RIGA ([id number]) Visit Report for 05/09/2020 Abuse/Suicide Risk Screen Details Patient Name: Date of Service: Jocelyn Wilson, [place] M. 05/09/2020 1:15 PM Medical Record Number: [id number] Patient Account Number: [id number] Date of Birth/Sex: Treating RN: Jocelyn Wilson (69 y.o. Jocelyn Wilson Primary Care Talley Wilson: Jocelyn Wilson: Referring Jocelyn Wilson: Treating Jocelyn Wilson/Extender: Jocelyn Wilson in Treatment: 0 Abuse/Suicide Risk Screen Items Answer ABUSE RISK SCREEN: Has anyone close to you tried to hurt or harm you recentlyo No Do you feel uncomfortable with anyone in your familyo No Has anyone forced you do things that you didnt want to doo No Electronic Signature(s) Signed: 05/09/2020 4:58:30 PM By: Jocelyn Coria RN Entered By: Jocelyn Wilson on 05/09/2020 14:13:40 -------------------------------------------------------------------------------- Activities of Daily Living Details Patient Name: Date of Service: Jocelyn Wilson, [place] M. 05/09/2020 1:15 PM Medical Record Number: [id number] Patient Account Number: [id number] Date of Birth/Sex: Treating RN: Jocelyn Wilson (69 y.o. Jocelyn Wilson Primary Care Jocelyn Wilson: Jocelyn Wilson: Referring Jocelyn Wilson: Treating Jocelyn Wilson: Jocelyn Wilson in Treatment: 0 Activities of Daily Living Items Answer Activities of Daily Living (Please select one for each item) Drive Automobile Completely Able T Medications ake Completely Able Use T elephone Completely Able Care for Appearance Completely Able Use T oilet Completely Able Bath / Shower Completely Able Dress Self Completely Able Feed Self Completely Able Walk Completely Able Get In / Out Bed Completely Able Housework Completely Able Prepare Meals Completely [place] for Self Need Assistance Electronic Signature(s) Signed: 05/09/2020 4:58:30 PM By: Jocelyn Coria RN Entered By: Jocelyn Wilson on 05/09/2020 14:14:34 -------------------------------------------------------------------------------- Education Screening Details Patient Name: Date of Service: Jocelyn Wilson, [name] 05/09/2020 1:15 PM Medical Record Number: [id number] Patient Account Number: [id number] Date of Birth/Sex: Treating RN: [birth year] (69 y.o. Jocelyn Wilson Primary Care Jocelyn Wilson: Jocelyn Wilson: Referring Ann Groeneveld: Treating Delberta Folts/Extender: Jocelyn Wilson in Treatment: 0 Primary Learner Assessed: Patient Learning Preferences/Education Level/Primary Language Learning Preference: Explanation Highest Education Level: College or Above Preferred Language: English Cognitive Barrier Language Barrier: No Translator Needed: No Memory Deficit: No Emotional Barrier: No Cultural/Religious Beliefs Affecting Medical Care: No Physical Barrier Impaired Vision: No Impaired Hearing: No Decreased Hand dexterity: No Knowledge/Comprehension Knowledge Level: Medium Comprehension Level: High Ability to understand written instructions: High Ability to understand verbal instructions: High Motivation Anxiety Level: Anxious Cooperation: Cooperative Education Importance: Acknowledges Need Interest in Health Problems: Asks Questions Perception: Coherent Willingness to Engage in Self-Management High Activities: Readiness to Engage in Self-Management High Activities: Electronic Signature(s) Signed: 05/09/2020 4:58:30 PM By: Jocelyn Coria RN Entered By: Jocelyn Wilson on 05/09/2020 14:15:00 -------------------------------------------------------------------------------- Fall Risk Assessment Details Patient Name: Date of Service: Jocelyn Wilson, [name] 05/09/2020 1:15 PM Medical Record Number: [id number] Patient Account Number: [id number] Date of Birth/Sex: Treating RN: [birth year] (69 y.o. Jocelyn Wilson Primary Care Jocelyn Wilson:  Jocelyn Wilson: Referring Jocelyn Wilson: Treating Jocelyn Wilson: Jocelyn Wilson in Treatment: 0 Fall Risk Assessment Items Have you had 2 or more falls in the last 12 monthso 0 No Have you had any fall that resulted in injury in the last 12 monthso 0 No FALLS RISK SCREEN History of falling - immediate or within 3 months 0 No Secondary diagnosis (Do you have 2 or more medical diagnoseso) 0 No Ambulatory aid None/bed rest/wheelchair/nurse 0 No Crutches/cane/walker 0 No Furniture 0 No Intravenous therapy  Access/Saline/Heparin Lock 0 No Gait/Transferring Normal/ bed rest/ wheelchair 0 No Weak (short steps with or without shuffle, stooped but able to lift head while walking, Jocelyn seek 0 No support from furniture) Impaired (short steps with shuffle, Jocelyn have difficulty arising from chair, head down, impaired 0 No balance) Mental Status Oriented to own ability 0 No Electronic Signature(s) Signed: 05/09/2020 4:58:30 PM By: Jocelyn Coria RN Entered By: Jocelyn Wilson on 05/09/2020 14:16:12 -------------------------------------------------------------------------------- Nutrition Risk Screening Details Patient Name: Date of Service: Jocelyn Wilson, [name] 05/09/2020 1:15 PM Medical Record Number: [id number] Patient Account Number: [id number] Date of Birth/Sex: Treating RN: Jocelyn Wilson (69 y.o. Jocelyn Wilson Primary Care Zarian Colpitts: Jocelyn Wilson: Referring Jocelyn Wilson: Treating Jocelyn Wilson: Jocelyn Cook, Jocelyn Wilson Wilson in Treatment: 0 Height (in): 58 Weight (lbs): 232 Body Mass Index (BMI): 48.5 Nutrition Risk Screening Items Score Screening NUTRITION RISK SCREEN: I have an illness or condition that made me change the kind and/or amount of food I eat 0 No I eat fewer than two meals per day 0 No I eat few fruits and vegetables, or milk products 0 No I have three or more drinks of beer, liquor or wine  almost every day 0 No I have tooth or mouth problems that make it hard for me to eat 0 No I don't always have enough money to buy the food I need 0 No I eat alone most of the time 0 No I take three or more different prescribed or over-the-counter drugs a day 1 Yes Without wanting to, I have lost or gained 10 pounds in the last six months 0 No I am not always physically able to shop, cook and/or feed myself 2 Yes Nutrition Protocols Good Risk Protocol Moderate Risk Protocol 0 Provide education on nutrition High Risk Proctocol Risk Level: Moderate Risk Score: 3 Electronic Signature(s) Signed: 05/09/2020 4:58:30 PM By: Jocelyn Coria RN Entered By: Jocelyn Wilson on 05/09/2020 14:16:23

## 2020-05-23 ENCOUNTER — Encounter (HOSPITAL_BASED_OUTPATIENT_CLINIC_OR_DEPARTMENT_OTHER): Payer: Medicare Other | Attending: Internal Medicine | Admitting: Internal Medicine

## 2020-05-23 ENCOUNTER — Encounter (HOSPITAL_BASED_OUTPATIENT_CLINIC_OR_DEPARTMENT_OTHER): Payer: Medicare Other | Admitting: Internal Medicine

## 2020-05-23 DIAGNOSIS — Z872 Personal history of diseases of the skin and subcutaneous tissue: Secondary | ICD-10-CM | POA: Diagnosis not present

## 2020-05-23 DIAGNOSIS — Z09 Encounter for follow-up examination after completed treatment for conditions other than malignant neoplasm: Secondary | ICD-10-CM | POA: Insufficient documentation

## 2020-05-23 DIAGNOSIS — I471 Supraventricular tachycardia: Secondary | ICD-10-CM | POA: Diagnosis not present

## 2020-05-23 DIAGNOSIS — G35 Multiple sclerosis: Secondary | ICD-10-CM | POA: Insufficient documentation

## 2020-05-23 DIAGNOSIS — L739 Follicular disorder, unspecified: Secondary | ICD-10-CM | POA: Diagnosis not present

## 2020-05-23 NOTE — Progress Notes (Addendum)
Jocelyn Wilson, Jocelyn Wilson ([id number]) Visit Report for 05/23/2020 HPI Details Patient Name: Date of Service: [place], [place] M. 05/23/2020 12:30 PM Medical Record Number: [id number] Patient Account Number: [id number] Date of Birth/Sex: Treating RN: 28-Jun-1951 (69 y.o. [name] Primary Care Provider: Aretta Nip Other Clinician: Referring Provider: Treating Provider/Extender: Annamary Carolin Weeks in Treatment: 2 History of Present Illness HPI Description: 05/09/2020 on evaluation today patient presents for initial evaluation here in our clinic concerning issues that she has been having with a wound on the right gluteal region. This occurred following her back surgery which was somewhere around March 26, 2020 at least when she noticed the wound. She had been sitting much more frequently due to the fact that following the back surgery she was not as mobile. Nonetheless she tells me at this time that fortunately she is getting around a little better though she still cannot stand for extremely long periods of time. She is still within the timeframe of hopefully seeing improvement from her back surgery which is good news. Fortunately there is no evidence of active infection at this time. Her surgical site has completely resolved and looks like it is doing quite well. She is going require some sharp debridement based on what I am seeing today. The patient does have a history of supraventricular tachycardia, diabetes mellitus type 2, and multiple sclerosis. 05/23/20-Patient returns in 2 weeks after starting in the clinic for right gluteal wound, this has healed, she does have a small left perianal folliculitis with some discomfort but no open areas Electronic Signature(s) Signed: 05/23/2020 1:00:51 PM By: Tobi Bastos MD, MBA Entered By: Tobi Bastos on 05/23/2020 13:00:50 -------------------------------------------------------------------------------- Physical  Exam Details Patient Name: Date of Service: Jocelyn Lye NN, [name] 05/23/2020 12:30 PM Medical Record Number: [id number] Patient Account Number: [id number] Date of Birth/Sex: Treating RN: 1951-01-21 (69 y.o. [name] Primary Care Provider: Aretta Nip Other Clinician: Referring Provider: Treating Provider/Extender: Annamary Carolin Weeks in Treatment: 2 Constitutional alert and oriented x 3. sitting or standing blood pressure is within target range for patient.. supine blood pressure is within target range for patient.. pulse regular and within target range for patient.Marland Kitchen respirations regular, non-labored and within target range for patient.Marland Kitchen temperature within target range for patient.. . . Well- nourished and well-hydrated in no acute distress. Notes Right gluteal wound is healed, left perianal small follicle that is tender to palpation but no induration surrounding it skin also is normal in appearance Electronic Signature(s) Signed: 05/23/2020 1:01:30 PM By: Tobi Bastos MD, MBA Entered By: Tobi Bastos on 05/23/2020 13:01:29 -------------------------------------------------------------------------------- Physician Orders Details Patient Name: Date of Service: Jocelyn Lye NN, [name] 05/23/2020 12:30 PM Medical Record Number: [id number] Patient Account Number: [id number] Date of Birth/Sex: Treating RN: [birth year] (69 y.o. [name] Primary Care Provider: Aretta Nip Other Clinician: Referring Provider: Treating Provider/Extender: Annamary Carolin Weeks in Treatment: 2 Verbal / Phone Orders: No Diagnosis Coding Discharge From [facility] Services Discharge from [place] - call if wound re-opens can use warm compresses and barrier cream (butt paste and/or calmoseptine Electronic Signature(s) Signed: 05/23/2020 4:53:28 PM By: Kela Millin Signed: 05/23/2020 4:53:36 PM By: Tobi Bastos MD,  MBA Entered By: Kela Millin on 05/23/2020 13:00:54 -------------------------------------------------------------------------------- Progress Note Details Patient Name: Date of Service: Jocelyn Lye NN, [name] 05/23/2020 12:30 PM Medical Record Number: [id number] Patient Account Number: [id number] Date of Birth/Sex: Treating RN: 20-Oct-1950 (68  y.o. [name] Primary Care Provider: Aretta Nip Other Clinician: Referring Provider: Treating Provider/Extender: Annamary Carolin Weeks in Treatment: 2 Subjective History of Present Illness (HPI) 05/09/2020 on evaluation today patient presents for initial evaluation here in our clinic concerning issues that she has been having with a wound on the right gluteal region. This occurred following her back surgery which was somewhere around March 26, 2020 at least when she noticed the wound. She had been sitting much more frequently due to the fact that following the back surgery she was not as mobile. Nonetheless she tells me at this time that fortunately she is getting around a little better though she still cannot stand for extremely long periods of time. She is still within the timeframe of hopefully seeing improvement from her back surgery which is good news. Fortunately there is no evidence of active infection at this time. Her surgical site has completely resolved and looks like it is doing quite well. She is going require some sharp debridement based on what I am seeing today. The patient does have a history of supraventricular tachycardia, diabetes mellitus type 2, and multiple sclerosis. 05/23/20-Patient returns in 2 weeks after starting in the clinic for right gluteal wound, this has healed, she does have a small left perianal folliculitis with some discomfort but no open areas Objective Constitutional alert and oriented x 3. sitting or standing blood pressure is within target range for patient.. supine  blood pressure is within target range for patient.. pulse regular and within target range for patient.Marland Kitchen respirations regular, non-labored and within target range for patient.Marland Kitchen temperature within target range for patient.. Well- nourished and well-hydrated in no acute distress. Vitals Time Taken: 12:46 PM, Height: 58 in, Weight: 232 lbs, BMI: 48.5, Temperature: 98.2 F, Pulse: 96 bpm, Respiratory Rate: 18 breaths/min, Blood Pressure: 140/83 mmHg. General Notes: Right gluteal wound is healed, left perianal small follicle that is tender to palpation but no induration surrounding it skin also is normal in appearance Integumentary (Hair, Skin) Wound #1 status is Healed - Epithelialized. Original cause of wound was Gradually Appeared. The wound is located on the Right Gluteus. The wound measures 0cm length x 0cm width x 0cm depth; 0cm^2 area and 0cm^3 volume. There is no tunneling or undermining noted. There is a none present amount of drainage noted. The wound margin is distinct with the outline attached to the wound base. There is no granulation within the wound bed. There is no necrotic tissue within the wound bed. Plan Discharge From [facility] Services: Discharge from [place] - call if wound re-opens can use warm compresses and barrier cream (butt paste and/or calmoseptine -Open area in the right gluteus is healed, asked patient to try warm compresses or antibacterial cream to the left perianal follicle area Electronic Signature(s) Signed: 05/23/2020 1:02:07 PM By: Tobi Bastos MD, MBA Entered By: Tobi Bastos on 05/23/2020 13:02:07 -------------------------------------------------------------------------------- SuperBill Details Patient Name: Date of Service: Jocelyn Lye NN, [name] 05/23/2020 Medical Record Number: [id number] Patient Account Number: [id number] Date of Birth/Sex: Treating RN: Jun 03, 1951 (69 y.o. 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, stage 3 I47.1 Supraventricular tachycardia E11.622 Type 2 diabetes mellitus with other skin ulcer G35 Multiple sclerosis Facility Procedures CPT4 Code: [id number] Description: [id number] - WOUND CARE VISIT-LEV 2 EST PT Modifier: Quantity:  1 Physician Procedures : CPT4 Code Description Modifier [id number] - WC PHYS LEVEL 2 - EST PT ICD-10 Diagnosis Description 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

## 2020-05-23 NOTE — Progress Notes (Signed)
Jocelyn Wilson, Jocelyn Wilson ([id number]) Visit Report for 05/23/2020 Arrival Information Details Patient Name: Date of Service: [place], [place] M. 05/23/2020 12:30 PM Medical Record Number: [id number] Patient Account Number: [id number] Date of Birth/Sex: Treating RN: 1951-09-20 (69 y.o. Nancy Fetter Primary Care Lakelyn Straus: Aretta Nip Other Clinician: Referring Greysyn Vanderberg: Treating Smita Lesh/Extender: Annamary Carolin Weeks in Treatment: 2 Visit Information History Since Last Visit Added or deleted any medications: No Patient Arrived: Jocelyn Wilson Any new allergies or adverse reactions: No Arrival Time: 12:42 Had a fall or experienced change in No Accompanied By: daughter activities of daily living that may affect Transfer Assistance: None risk of falls: Patient Identification Verified: Yes Signs or symptoms of abuse/neglect since last visito No Secondary Verification Process Completed: Yes Hospitalized since last visit: No Patient Requires Transmission-Based Precautions: No Implantable device outside of the clinic excluding No Patient Has Alerts: No cellular tissue based products placed in the center since last visit: Has Dressing in Place as Prescribed: Yes Pain Present Now: No Electronic Signature(s) Signed: 05/23/2020 5:19:54 PM By: Levan Hurst RN, BSN Entered By: Levan Hurst on 05/23/2020 12:45:57 -------------------------------------------------------------------------------- Clinic Level of Care Assessment Details Patient Name: Date of Service: [name], [name] 05/23/2020 12:30 PM Medical Record Number: [id number] Patient Account Number: [id number] Date of Birth/Sex: Treating RN: Feb 28, 1951 (69 y.o. Jocelyn Wilson Primary Care Malyia Moro: Aretta Nip Other Clinician: Referring Jeanne Terrance: Treating Lashan Gluth/Extender: Annamary Carolin Weeks in Treatment: 2 Clinic Level of Care Assessment Items TOOL 4 Quantity  Score X- 1 0 Use when only an EandM is performed on FOLLOW-UP visit ASSESSMENTS - Nursing Assessment / Reassessment X- 1 10 Reassessment of Co-morbidities (includes updates in patient status) X- 1 5 Reassessment of Adherence to Treatment Plan ASSESSMENTS - Wound and Skin A ssessment / Reassessment X - Simple Wound Assessment / Reassessment - one wound 1 5 []  - 0 Complex Wound Assessment / Reassessment - multiple wounds []  - 0 Dermatologic / Skin Assessment (not related to wound area) ASSESSMENTS - Focused Assessment []  - 0 Circumferential Edema Measurements - multi extremities []  - 0 Nutritional Assessment / Counseling / Intervention []  - 0 Lower Extremity Assessment (monofilament, tuning fork, pulses) []  - 0 Peripheral Arterial Disease Assessment (using hand held doppler) ASSESSMENTS - Ostomy and/or Continence Assessment and Care []  - 0 Incontinence Assessment and Management []  - 0 Ostomy Care Assessment and Management (repouching, etc.) PROCESS - Coordination of Care X - Simple Patient / Family Education for ongoing care 1 15 []  - 0 Complex (extensive) Patient / Family Education for ongoing care X- 1 10 Staff obtains Programmer, systems, Records, T Results / Process Orders est []  - 0 Staff telephones HHA, Nursing Homes / Clarify orders / etc []  - 0 Routine Transfer to another Facility (non-emergent condition) []  - 0 Routine Hospital Admission (non-emergent condition) []  - 0 New Admissions / Biomedical engineer / Ordering NPWT Apligraf, etc. , []  - 0 Emergency Hospital Admission (emergent condition) X- 1 10 Simple Discharge Coordination []  - 0 Complex (extensive) Discharge Coordination PROCESS - Special Needs []  - 0 Pediatric / Minor Patient Management []  - 0 Isolation Patient Management []  - 0 Hearing / Language / Visual special needs []  - 0 Assessment of Community assistance (transportation, D/C planning, etc.) []  - 0 Additional assistance / Altered  mentation []  - 0 Support Surface(s) Assessment (bed, cushion, seat, etc.) INTERVENTIONS - Wound Cleansing / Measurement X - Simple Wound Cleansing - one wound 1 5 []  -  0 Complex Wound Cleansing - multiple wounds X- 1 5 Wound Imaging (photographs - any number of wounds) []  - 0 Wound Tracing (instead of photographs) X- 1 5 Simple Wound Measurement - one wound []  - 0 Complex Wound Measurement - multiple wounds INTERVENTIONS - Wound Dressings []  - 0 Small Wound Dressing one or multiple wounds []  - 0 Medium Wound Dressing one or multiple wounds []  - 0 Large Wound Dressing one or multiple wounds []  - 0 Application of Medications - topical []  - 0 Application of Medications - injection INTERVENTIONS - Miscellaneous []  - 0 External ear exam []  - 0 Specimen Collection (cultures, biopsies, blood, body fluids, etc.) []  - 0 Specimen(s) / Culture(s) sent or taken to Lab for analysis []  - 0 Patient Transfer (multiple staff / Civil Service fast streamer / Similar devices) []  - 0 Simple Staple / Suture removal (25 or less) []  - 0 Complex Staple / Suture removal (26 or more) []  - 0 Hypo / Hyperglycemic Management (close monitor of Blood Glucose) []  - 0 Ankle / Brachial Index (ABI) - do not check if billed separately X- 1 5 Vital Signs Has the patient been seen at the hospital within the last three years: Yes Total Score: 75 Level Of Care: New/Established - Level 2 Electronic Signature(s) Signed: 05/23/2020 4:53:28 PM By: Kela Millin Entered By: Kela Millin on 05/23/2020 13:06:34 -------------------------------------------------------------------------------- Multi-Disciplinary Care Plan Details Patient Name: Date of Service: Jocelyn Lye NN, [name] 05/23/2020 12:30 PM Medical Record Number: [id number] Patient Account Number: [id number] Date of Birth/Sex: Treating RN: [birth year] (69 y.o. Jocelyn Wilson Primary Care Marquiz Sotelo: Aretta Nip Other Clinician: Referring  Josephus Harriger: Treating Shenita Trego/Extender: Annamary Carolin Weeks in Treatment: 2 Active Inactive Electronic Signature(s) Signed: 05/23/2020 4:53:28 PM By: Kela Millin Entered By: Kela Millin on 05/23/2020 13:04:46 -------------------------------------------------------------------------------- Pain Assessment Details Patient Name: Date of Service: Jocelyn Lye NN, [name] 05/23/2020 12:30 PM Medical Record Number: [id number] Patient Account Number: [id number] Date of Birth/Sex: Treating RN: April 26, 1951 (69 y.o. Nancy Fetter Primary Care Kitzia Camus: Aretta Nip Other Clinician: Referring Yasmene Salomone: Treating Pasqualina Colasurdo/Extender: Annamary Carolin Weeks in Treatment: 2 Active Problems Location of Pain Severity and Description of Pain Patient Has Paino No Site Locations Pain Management and Medication Current Pain Management: Electronic Signature(s) Signed: 05/23/2020 5:19:54 PM By: Levan Hurst RN, BSN Entered By: Levan Hurst on 05/23/2020 12:46:31 -------------------------------------------------------------------------------- Wound Assessment Details Patient Name: Date of Service: Jocelyn Lye NN, [name] 05/23/2020 12:30 PM Medical Record Number: [id number] Patient Account Number: [id number] Date of Birth/Sex: Treating RN: 04-29-1951 (69 y.o. Nancy Fetter Primary Care Adina Puzzo: Aretta Nip Other Clinician: Referring Dontrez Pettis: Treating Zeph Riebel/Extender: Annamary Carolin Weeks in Treatment: 2 Wound Status Wound Number: 1 Primary Etiology: Pressure Ulcer Wound Location: Right Gluteus Wound Status: Healed - Epithelialized Wounding Event: Gradually Appeared Comorbid History: Hypertension, Type II Diabetes Date Acquired: 03/26/2020 Weeks Of Treatment: 2 Clustered Wound: No Wound Measurements Length: (cm) Width: (cm) Depth: (cm) Area: (cm) Volume: (cm) 0 % Reduction in Area: 100% 0 %  Reduction in Volume: 100% 0 Epithelialization: Large (67-100%) 0 Tunneling: No 0 Undermining: No Wound Description Classification: Category/Stage III Wound Margin: Distinct, outline attached Exudate Amount: None Present Foul Odor After Cleansing: No Slough/Fibrino No Wound Bed Granulation Amount: None Present (0%) Exposed Structure Necrotic Amount: None Present (0%) Fascia Exposed: No Fat Layer (Subcutaneous Tissue) Exposed: No Tendon Exposed: No Muscle Exposed: No Joint Exposed: No Bone Exposed: No Electronic Signature(s)  Signed: 05/23/2020 5:19:54 PM By: Levan Hurst RN, BSN Entered By: Levan Hurst on 05/23/2020 12:51:54 -------------------------------------------------------------------------------- [place] Details Patient Name: Date of Service: Jocelyn Lye NN, [name] 05/23/2020 12:30 PM Medical Record Number: [id number] Patient Account Number: [id number] Date of Birth/Sex: Treating RN: Nov 06, 1950 (69 y.o. Nancy Fetter Primary Care Lashe Oliveira: Aretta Nip Other Clinician: Referring Linder Prajapati: Treating Jo Cerone/Extender: Annamary Carolin Weeks in Treatment: 2 Vital Signs Time Taken: 12:46 Temperature (F): 98.2 Height (in): 58 Pulse (bpm): 96 Weight (lbs): 232 Respiratory Rate (breaths/min): 18 Body Mass Index (BMI): 48.5 Blood Pressure (mmHg): 140/83 Reference Range: 80 - 120 mg / dl Electronic Signature(s) Signed: 05/23/2020 5:19:54 PM By: Levan Hurst RN, BSN Entered By: Levan Hurst on 05/23/2020 12:81:18

## 2020-06-11 ENCOUNTER — Encounter: Payer: Self-pay | Admitting: Neurology

## 2020-06-11 ENCOUNTER — Other Ambulatory Visit: Payer: Self-pay

## 2020-06-11 ENCOUNTER — Telehealth: Payer: Self-pay | Admitting: Neurology

## 2020-06-11 ENCOUNTER — Ambulatory Visit: Payer: Medicare Other | Admitting: Neurology

## 2020-06-11 VITALS — BP 137/73 | HR 93 | Ht <= 58 in | Wt 227.0 lb

## 2020-06-11 DIAGNOSIS — F32A Depression, unspecified: Secondary | ICD-10-CM

## 2020-06-11 DIAGNOSIS — F411 Generalized anxiety disorder: Secondary | ICD-10-CM | POA: Diagnosis not present

## 2020-06-11 DIAGNOSIS — G35 Multiple sclerosis: Secondary | ICD-10-CM

## 2020-06-11 DIAGNOSIS — D329 Benign neoplasm of meninges, unspecified: Secondary | ICD-10-CM

## 2020-06-11 DIAGNOSIS — F329 Major depressive disorder, single episode, unspecified: Secondary | ICD-10-CM | POA: Diagnosis not present

## 2020-06-11 DIAGNOSIS — M4326 Fusion of spine, lumbar region: Secondary | ICD-10-CM

## 2020-06-11 DIAGNOSIS — R269 Unspecified abnormalities of gait and mobility: Secondary | ICD-10-CM

## 2020-06-11 NOTE — Telephone Encounter (Signed)
[employer] medicare order sent to [facility]. No auth they will reach out to the patient to schedule.  

## 2020-06-11 NOTE — Progress Notes (Signed)
[facility]  PATIENT: Jocelyn Wilson DOB: June 25, 1951  REFERRING DOCTOR OR PCP:  Adaku Nnodi SOURCE: Patient  _________________________________   HISTORICAL  CHIEF COMPLAINT:  Chief Complaint  Patient presents with  . Follow-up    ms fu, pt states ms is stable   . room 13    alone     HISTORY OF PRESENT ILLNESS:  Jocelyn Wilson is a 69 y.o. woman with multiple sclerosis, back pain (facet syndrome) s/p fusion, and migraines.  Update 06/11/2020: She is reporting more LBP.    She had surgery 02/22/2020 and noted no improvement.  The surgery was Left L3L4 anterior lateral retroperitoneal interbody decompression.    More recently Dr. Annette Stable did x-rays with flexion and extension and she was told the fusion looked good.   She takes tramadol rarely, just about once a week.   It makes her sleepy but does help pain.     She also has MS which has been stable off of Betaseron (since 2019).   She was on Betaseron x many years.   She had right  ON in the past and still has mild sequelae.   She has a low plaque burden and has had no new lesions x years.     She reports continued depression and anxiety.  The anxiety can be severe at times..  She is on BuSpar, and Zoloft.   MS History:   Her MS was diagnosed around 2004.    She was actually seeing me for migraine headache when she presented with optic neuritis and vertigo and had another MRI.   She had interval development of multiple new foci and underwent a lumbar puncture.  CSF was also consistent with MS.   She was placed on Betaseron and continues on that medication with good control of the MS. No definite exacerbations.  Due to long-term debility, duration of her MS and her age, the decision was made to stop the Betaseron in 2019.  She has not had any exacerbations.  IMAGING 11/03/2018 MRI of the lumbar spine shows that there has been significant changes at L3-L4 noted on the current MRI that have progressed compared to the  2017 MRI.  Specifically, she now has spinal stenosis at L3-L4 with an AP diameter of 8.2 mm.  That diameter was 10.8 mm in 2017.  Additionally, there is about 2 mm of anterolisthesis likely due to the facet hypertrophy that has progressed.  I believe it is playing some role in her pain.     11/03/2018 MRI of the brain shows that the MS is mild and stable.  She has a 12-millimeter extra-axial mass in the olfactory groove on the right that was not present on MRI 11/09/2017.  It is hyperintense on T2 and FLAIR images and hypointense on T1-weighted images.  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  I am concerned about the rapid growth from not being visible to 12 mm in just 1 year.  I recommend that we repeat the MRI.   She has had an allergic reaction to contrast in the past so we would do the study without.  11/13/2018  Lumbar flex/ext  IMPRESSION: Mild dynamic instability at the L3-4 level, up to 5 mm anterolisthesis with patient standing in flexion. This is facet mediated, based on plain film appearance as well as review of MR.  Severe degenerative disease L4-5, near complete loss of interspace Height.  05/15/2019 MRI of the brain shows stable MS.  The extra-axial  mass in the right olfactory groove (the study is also noncontrasted due to patient's intolerance of contrast in the past) actually appears a little smaller than on the earlier MRI.    REVIEW OF SYSTEMS: Constitutional: No fevers, chills, sweats, or change in appetite Eyes: No visual changes, double vision, eye pain Ear, nose and throat: No hearing loss, ear pain, nasal congestion, sore throat Cardiovascular: No chest pain, palpitations Respiratory: No shortness of breath at rest or with exertion.   No wheezes GastrointestinaI: No nausea, vomiting, diarrhea, abdominal pain, fecal incontinence Genitourinary: No dysuria, urinary retention or frequency.  No nocturia. Musculoskeletal: No neck pain, back pain Integumentary: No  rash, pruritus, skin lesions Neurological: as above Psychiatric: No depression at this time.  No anxiety Endocrine: No palpitations, diaphoresis, change in appetite, change in weigh or increased thirst Hematologic/Lymphatic: No anemia, purpura, petechiae. Allergic/Immunologic: No itchy/runny eyes, nasal congestion, recent allergic reactions, rashes  ALLERGIES: Allergies  Allergen Reactions  . Iron Anaphylaxis    Iv iron  . Ciprofloxacin Nausea Only  . Contrast Media [Iodinated Diagnostic Agents] Itching and Rash  . Latex Rash  . Other Other (See Comments)    Raw apples. Cucumbers, hazelnuts, Raw vegetables, Raw fruits, except citrus    . Sulfa Antibiotics Itching and Rash  . Tape Rash    Adhesive tape    HOME MEDICATIONS:  Current Outpatient Medications:  .  atorvastatin (LIPITOR) 20 MG tablet, Take 20 mg by mouth at bedtime. , Disp: , Rfl:  .  busPIRone (BUSPAR) 30 MG tablet, TAKE 1 TABLET BY MOUTH 3  TIMES DAILY, Disp: 270 tablet, Rfl: 1 .  Cyanocobalamin (VITAMIN B12) 1000 MCG TBCR, Take 1,000 mcg by mouth daily. , Disp: , Rfl:  .  esomeprazole (NEXIUM) 40 MG capsule, TAKE 1 CAPSULE BY MOUTH  TWICE DAILY (Patient taking differently: Take 40 mg by mouth 2 (two) times daily before a meal. TAKE 1 CAPSULE BY MOUTH  TWICE DAILY), Disp: 180 capsule, Rfl: 3 .  ferrous sulfate 325 (65 FE) MG tablet, Take 325 mg by mouth daily. , Disp: , Rfl:  .  Ibuprofen (ADVIL) 200 MG CAPS, Take 400 mg by mouth 3 (three) times daily as needed (headache or migraine). , Disp: , Rfl:  .  Insulin Pen Needle (BD PEN NEEDLE NANO U/F) 32G X 4 MM MISC, 1 Package by Does not apply route 2 (two) times daily., Disp: 100 each, Rfl: 0 .  levothyroxine (SYNTHROID) 137 MCG tablet, Take 137 mcg by mouth daily before breakfast. , Disp: , Rfl:  .  liraglutide (VICTOZA) 18 MG/3ML SOPN, ADMINISTER 0.6 MG UNDER THE SKIN EVERY MORNING (Patient taking differently: Inject 0.6 mg into the skin daily with breakfast. ), Disp: 3  mL, Rfl: 0 .  meclizine (ANTIVERT) 12.5 MG tablet, TAKE 1 TABLET(12.5 MG) BY MOUTH THREE TIMES DAILY AS NEEDED FOR DIZZINESS (Patient taking differently: Take 12.5 mg by mouth 3 (three) times daily as needed for dizziness. TAKE 1 TABLET(12.5 MG) BY MOUTH THREE TIMES DAILY AS NEEDED FOR DIZZINESS), Disp: 270 tablet, Rfl: 1 .  metFORMIN (GLUCOPHAGE-XR) 500 MG 24 hr tablet, Take 1,000 mg by mouth 2 (two) times daily., Disp: , Rfl:  .  methocarbamol (ROBAXIN) 500 MG tablet, Take 1 tablet (500 mg total) by mouth 4 (four) times daily., Disp: 40 tablet, Rfl: 0 .  metoprolol succinate (TOPROL-XL) 50 MG 24 hr tablet, TAKE 1 TABLET BY MOUTH  DAILY (Patient taking differently: Take 50 mg by mouth daily. ), Disp: 90  tablet, Rfl: 3 .  ONE TOUCH ULTRA TEST test strip, USE TO CHECK FASTING AND PRE-DINNER BLOOD GLUCOSE BID, Disp: , Rfl: 12 .  ONETOUCH DELICA LANCETS 35K MISC, USE TO CHECK FASTING AND PRE-DINNER BLOOD GLUCOSE BID, Disp: , Rfl: 12 .  potassium citrate (UROCIT-K) 10 MEQ (1080 MG) SR tablet, Take 20 mEq by mouth 3 (three) times daily with meals. , Disp: , Rfl:  .  rOPINIRole (REQUIP) 1 MG tablet, TAKE 1 TABLET BY MOUTH 4  TIMES DAILY, Disp: 360 tablet, Rfl: 3 .  sertraline (ZOLOFT) 100 MG tablet, TAKE 1 AND 1/2 TABLETS BY  MOUTH DAILY (Patient taking differently: Take 150 mg by mouth daily. ), Disp: 135 tablet, Rfl: 3 .  traMADol (ULTRAM) 50 MG tablet, TAKE 1 TABLET BY MOUTH 3  TIMES DAILY AS NEEDED, Disp: 90 tablet, Rfl: 5 .  traZODone (DESYREL) 150 MG tablet, Take 50 mg by mouth at bedtime., Disp: , Rfl:  .  VITAMIN D PO, Take 5,000 Units by mouth daily. , Disp: , Rfl:   PAST MEDICAL HISTORY: Past Medical History:  Diagnosis Date  . Allergy   . Anxiety   . Arthritis   . Asthma    does not use an inhaer- patient does not think she does  . Back pain   . Chronic kidney disease    KIDNEY STONES WITH STENTS  . Depression   . Diabetes ([place])    type II  . Diverticula of colon   . Dyspnea    if  climbing steps  . Eczema   . Food allergy   . Gallbladder problem   . GERD (gastroesophageal reflux disease)   . History of kidney stones   . Hypercholesterolemia   . Hyperlipidemia   . Hypothyroidism   . Joint pain   . Kidney stones   . Leg edema   . Migraine headache    with pressure change  . Multiple sclerosis ([place])   . Obesity   . Palpitations   . PONV (postoperative nausea and vomiting)    with breast reduction surgery in [place] from the ~ 1980's/1990's  . Restless leg syndrome   . Right sided sciatica 10/29/2015  . Snoring   . Thyroid disease    hyperthyroidism  . Unsteady gait   . Vertigo   . Vision abnormalities     PAST SURGICAL HISTORY: Past Surgical History:  Procedure Laterality Date  . ANTERIOR LATERAL LUMBAR FUSION WITH PERCUTANEOUS SCREW 1 LEVEL Left 02/22/2020   Procedure: Anterior Lateral Lumbar Fusion - Lumbar Three-Lumbar Four with percutaneous pedicle screws;  Surgeon: Earnie Larsson, MD;  Location: [facility];  Service: Neurosurgery;  Laterality: Left;  Anterior Lateral Lumbar Fusion - Lumbar Three-Lumbar Four with percutaneous pedicle screws  . BREAST SURGERY     AUGMENTATION  . carpel tunnel Bilateral   . CHOLECYSTECTOMY    . DILATION AND CURETTAGE OF UTERUS  1983  . ESOPHAGOGASTRODUODENOSCOPY    . FINGER SURGERY  x8    trigger finger all except left pinky- right pinky didnt work  . LITHOTRIPSY    . NISSEN FUNDOPLICATION    . VENTRAL HERNIA REPAIR  07/25/2012   Procedure: LAPAROSCOPIC VENTRAL HERNIA;  Surgeon: Adin Hector, MD;  Location: [facility];  Service: General;  Laterality: N/A;    FAMILY HISTORY: Family History  Problem Relation Age of Onset  . Hyperlipidemia Mother   . Glaucoma Mother   . Hypertension Mother   . Anxiety disorder Mother   .  Depression Mother   . Heart disease Father   . Cancer Father        prostate  . Diabetes Father   . Dementia Father   . Hypertension Father   . Hyperlipidemia Father   . Sleep apnea Father   .  Obesity Father   . Cancer Brother        esophageal  . Arthritis Sister        RA    SOCIAL HISTORY:  Social History   Socioeconomic History  . Marital status: Married    Spouse name: Laiklynn Raczynski  . Number of children: 1  . Years of education: Not on file  . Highest education level: Not on file  Occupational History  . Occupation: retired  Tobacco Use  . Smoking status: Former Smoker    Quit date: 1978    Years since quitting: 43.6  . Smokeless tobacco: Never Used  Vaping Use  . Vaping Use: Never used  Substance and Sexual Activity  . Alcohol use: No  . Drug use: No  . Sexual activity: Not on file  Other Topics Concern  . Not on file  Social History Narrative  . Not on file   Social Determinants of Health   Financial Resource Strain:   . Difficulty of Paying Living Expenses: Not on file  Food Insecurity:   . Worried About Charity fundraiser in the Last Year: Not on file  . Ran Out of Food in the Last Year: Not on file  Transportation Needs:   . Lack of Transportation (Medical): Not on file  . Lack of Transportation (Non-Medical): Not on file  Physical Activity:   . Days of Exercise per Week: Not on file  . Minutes of Exercise per Session: Not on file  Stress:   . Feeling of Stress : Not on file  Social Connections:   . Frequency of Communication with Friends and Family: Not on file  . Frequency of Social Gatherings with Friends and Family: Not on file  . Attends Religious Services: Not on file  . Active Member of Clubs or Organizations: Not on file  . Attends Archivist Meetings: Not on file  . Marital Status: Not on file  Intimate Partner Violence:   . Fear of Current or Ex-Partner: Not on file  . Emotionally Abused: Not on file  . Physically Abused: Not on file  . Sexually Abused: Not on file     PHYSICAL EXAM  Vitals:   06/11/20 1138  BP: 137/73  Pulse: 93  Weight: 227 lb (103 kg)  Height: 4\' 9"  (1.448 m)    Body mass index is  49.12 kg/m.   General: The patient is well-developed and well-nourished and in no acute distress  Musculoskeletal:  Lower lumbar paraspinals are nontender.  Postoperative changes.  She has reduced range of motion in the back.  Neurologic Exam  Mental status: The patient is alert and oriented x 3 at the time of the examination. The patient has apparent normal recent and remote memory, with an apparently normal attention span and concentration ability.   Speech is normal.  Cranial nerves: Extraocular movements are full.  Facial strength and sensation is normal.  Trapezius strength is normal.  The tongue is midline, and the patient has symmetric elevation of the soft palate. No obvious hearing deficits are noted.  Motor:  Muscle bulk is normal.   Tone is normal. Strength is  5 / 5 in all 4 extremities.  Sensory: Sensory testing is intact to soft touch.     Gait and station: Station is normal.  The gait is arthritic and mildly wide..  Tandem gait is wide.   Romberg negative  Reflexes: Deep tendon reflexes are symmetric and normal bilaterally.     ______________________________________________  Multiple sclerosis ([place]) - Plan: MR BRAIN WO CONTRAST  Meningioma ([place]) - Plan: MR BRAIN WO CONTRAST  Depression, unspecified depression type  Anxiety state  Fusion of lumbar spine  Gait disturbance - Plan: MR CERVICAL SPINE WO CONTRAST  1.  She continues to experience a lot of back pain despite her surgery.  Tramadol makes her sleepy but I have asked her to try taking 1/2 pill 3 times a day around-the-clock (25 mg 3 times a day) to see if that can help her pain but be better tolerated.  We could also consider an extended release tramadol.  She will also be doing physical therapy.   2.   She is off of Betaseron and she will continue off of a disease modifying therapy for now.  Since she is having some more difficulty with her gait we need to check an MRI of the brain and cervical spine to  determine if there have been any new lesions.  If this is occurring we would need to reconsider a disease modifying therapy. 3.   Stay active and exercise as tolerated. 4. Return in 6 months or sooner if there are new or worsening neurologic symptoms.   Anira Senegal A. Felecia Shelling, MD, PhD 10/17/107, 3:23 PM Certified in Neurology, Clinical Neurophysiology, Sleep Medicine, Pain Medicine and Neuroimaging  [facility] [address] [place], [place] [address] [phone]  ;,

## 2020-07-05 ENCOUNTER — Other Ambulatory Visit: Payer: Self-pay | Admitting: Neurology

## 2020-10-15 DIAGNOSIS — R2681 Unsteadiness on feet: Secondary | ICD-10-CM | POA: Diagnosis not present

## 2020-10-15 DIAGNOSIS — M5459 Other low back pain: Secondary | ICD-10-CM | POA: Diagnosis not present

## 2020-10-15 DIAGNOSIS — R262 Difficulty in walking, not elsewhere classified: Secondary | ICD-10-CM | POA: Diagnosis not present

## 2020-10-21 DIAGNOSIS — R2681 Unsteadiness on feet: Secondary | ICD-10-CM | POA: Diagnosis not present

## 2020-10-21 DIAGNOSIS — R262 Difficulty in walking, not elsewhere classified: Secondary | ICD-10-CM | POA: Diagnosis not present

## 2020-10-21 DIAGNOSIS — M5459 Other low back pain: Secondary | ICD-10-CM | POA: Diagnosis not present

## 2020-10-29 DIAGNOSIS — R262 Difficulty in walking, not elsewhere classified: Secondary | ICD-10-CM | POA: Diagnosis not present

## 2020-10-29 DIAGNOSIS — R2681 Unsteadiness on feet: Secondary | ICD-10-CM | POA: Diagnosis not present

## 2020-10-29 DIAGNOSIS — M5459 Other low back pain: Secondary | ICD-10-CM | POA: Diagnosis not present

## 2020-10-30 DIAGNOSIS — E119 Type 2 diabetes mellitus without complications: Secondary | ICD-10-CM | POA: Diagnosis not present

## 2020-10-30 DIAGNOSIS — H4720 Unspecified optic atrophy: Secondary | ICD-10-CM | POA: Diagnosis not present

## 2020-10-30 DIAGNOSIS — H2513 Age-related nuclear cataract, bilateral: Secondary | ICD-10-CM | POA: Diagnosis not present

## 2020-11-12 DIAGNOSIS — R262 Difficulty in walking, not elsewhere classified: Secondary | ICD-10-CM | POA: Diagnosis not present

## 2020-11-12 DIAGNOSIS — M5459 Other low back pain: Secondary | ICD-10-CM | POA: Diagnosis not present

## 2020-11-12 DIAGNOSIS — M4310 Spondylolisthesis, site unspecified: Secondary | ICD-10-CM | POA: Diagnosis not present

## 2020-11-12 DIAGNOSIS — R2681 Unsteadiness on feet: Secondary | ICD-10-CM | POA: Diagnosis not present

## 2020-11-16 ENCOUNTER — Other Ambulatory Visit: Payer: Self-pay | Admitting: Neurology

## 2020-11-17 DIAGNOSIS — R300 Dysuria: Secondary | ICD-10-CM | POA: Diagnosis not present

## 2020-11-17 DIAGNOSIS — E7800 Pure hypercholesterolemia, unspecified: Secondary | ICD-10-CM | POA: Diagnosis not present

## 2020-11-17 DIAGNOSIS — K219 Gastro-esophageal reflux disease without esophagitis: Secondary | ICD-10-CM | POA: Diagnosis not present

## 2020-11-17 DIAGNOSIS — E1169 Type 2 diabetes mellitus with other specified complication: Secondary | ICD-10-CM | POA: Diagnosis not present

## 2020-11-17 DIAGNOSIS — I10 Essential (primary) hypertension: Secondary | ICD-10-CM | POA: Diagnosis not present

## 2020-11-17 DIAGNOSIS — M8580 Other specified disorders of bone density and structure, unspecified site: Secondary | ICD-10-CM | POA: Diagnosis not present

## 2020-11-17 DIAGNOSIS — J45909 Unspecified asthma, uncomplicated: Secondary | ICD-10-CM | POA: Diagnosis not present

## 2020-11-17 DIAGNOSIS — E785 Hyperlipidemia, unspecified: Secondary | ICD-10-CM | POA: Diagnosis not present

## 2020-11-17 DIAGNOSIS — E039 Hypothyroidism, unspecified: Secondary | ICD-10-CM | POA: Diagnosis not present

## 2020-11-19 DIAGNOSIS — R262 Difficulty in walking, not elsewhere classified: Secondary | ICD-10-CM | POA: Diagnosis not present

## 2020-11-19 DIAGNOSIS — R2681 Unsteadiness on feet: Secondary | ICD-10-CM | POA: Diagnosis not present

## 2020-11-19 DIAGNOSIS — M5459 Other low back pain: Secondary | ICD-10-CM | POA: Diagnosis not present

## 2020-11-26 DIAGNOSIS — M5459 Other low back pain: Secondary | ICD-10-CM | POA: Diagnosis not present

## 2020-11-26 DIAGNOSIS — R262 Difficulty in walking, not elsewhere classified: Secondary | ICD-10-CM | POA: Diagnosis not present

## 2020-11-26 DIAGNOSIS — R2681 Unsteadiness on feet: Secondary | ICD-10-CM | POA: Diagnosis not present

## 2020-12-03 DIAGNOSIS — M5459 Other low back pain: Secondary | ICD-10-CM | POA: Diagnosis not present

## 2020-12-03 DIAGNOSIS — R2681 Unsteadiness on feet: Secondary | ICD-10-CM | POA: Diagnosis not present

## 2020-12-03 DIAGNOSIS — R262 Difficulty in walking, not elsewhere classified: Secondary | ICD-10-CM | POA: Diagnosis not present

## 2020-12-08 IMAGING — CR DG LUMBAR SPINE COMPLETE W/ BEND
7 series · 7 of 7 positions shown · non-contrast
Comparison: MRI lumbar spine 11/03/2018.

CLINICAL DATA: Low back pain for months.  No trauma.

EXAM:
LUMBAR SPINE - COMPLETE WITH BENDING VIEWS

[w lumbar spine ap]
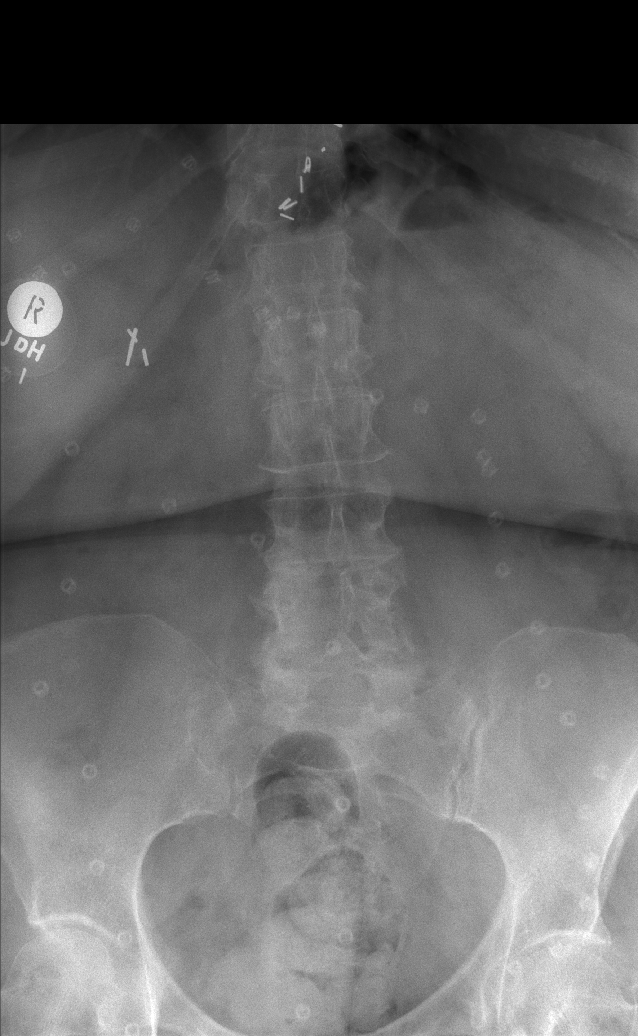

[w lumbar spine obl (1 of 2)]
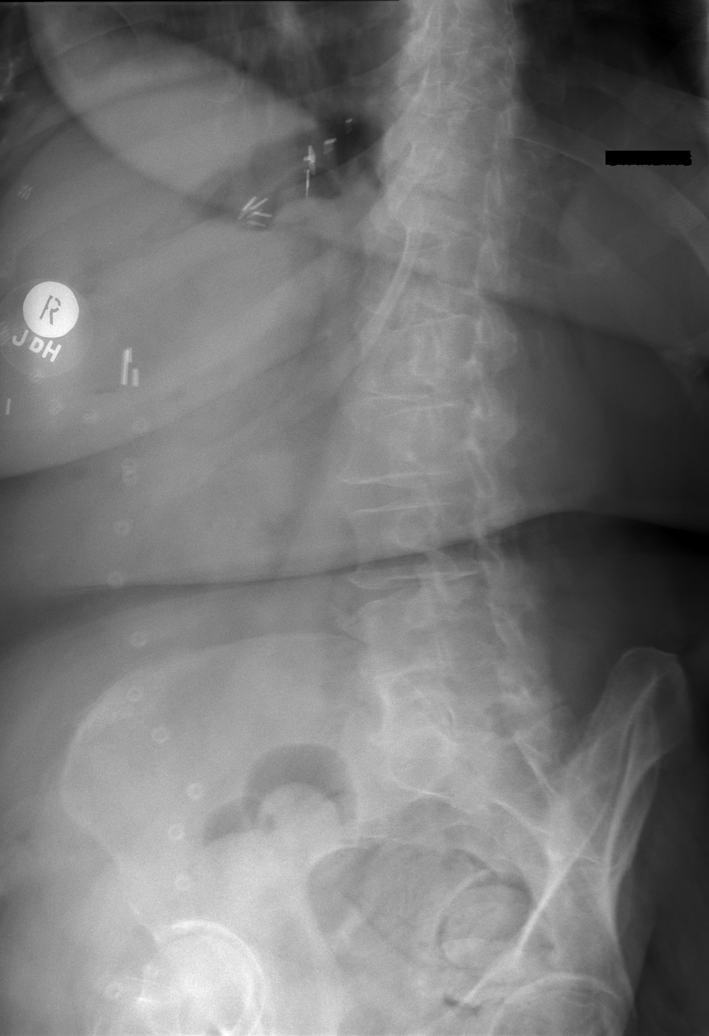

[w lumbar spine obl (2 of 2)]
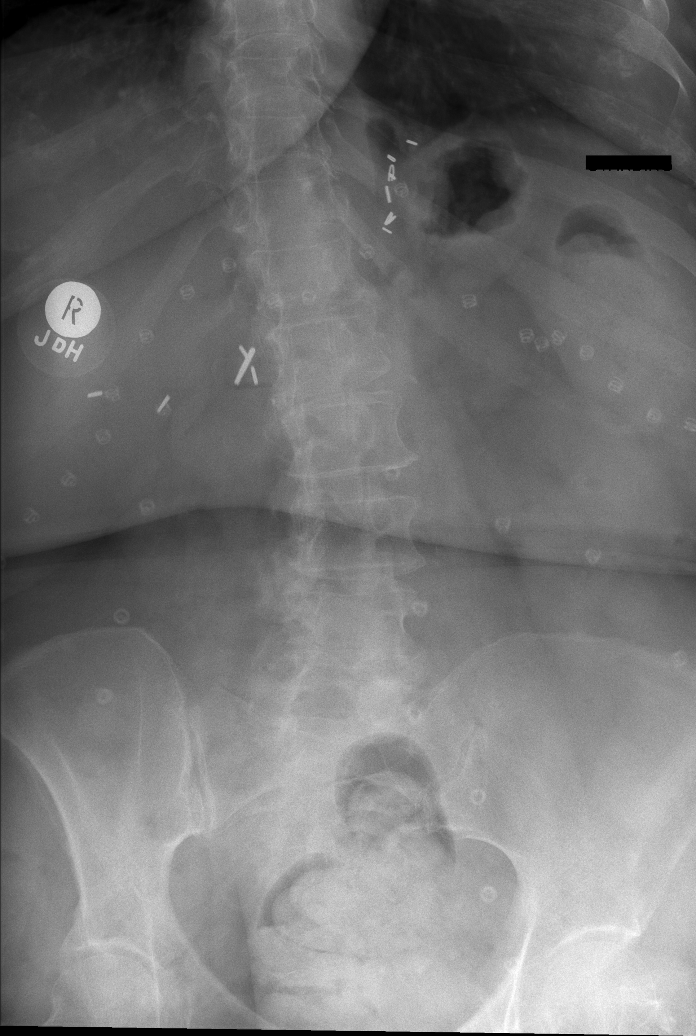

[w lumbar spine lat]
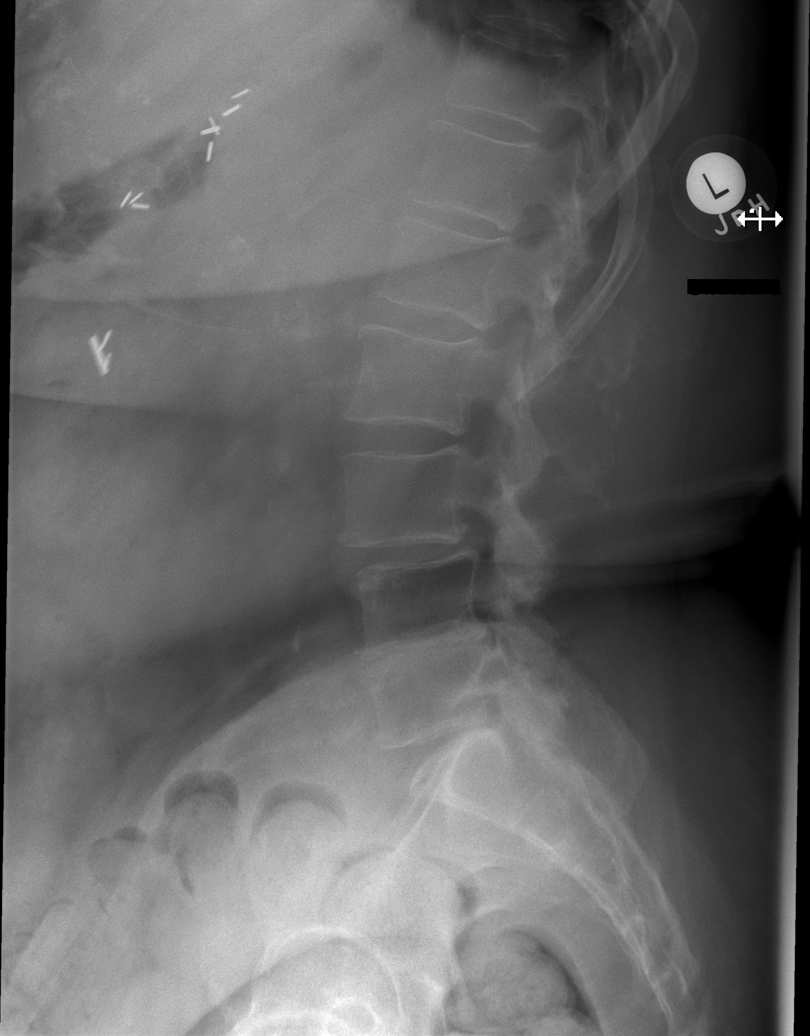

[w lumbar spine flexion]
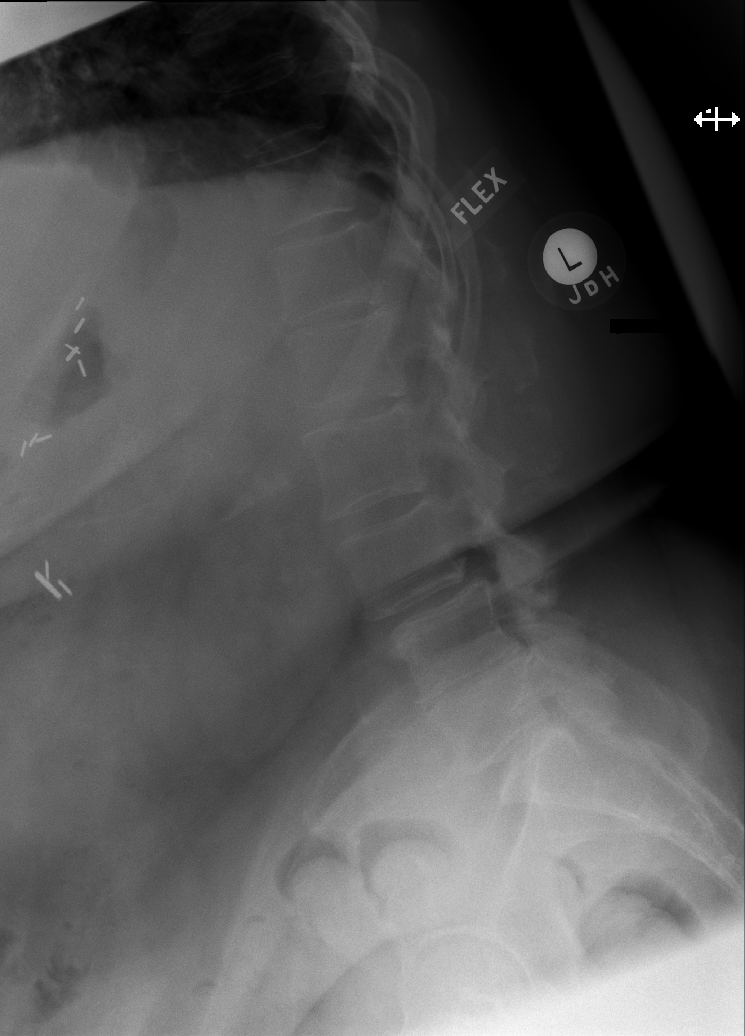

[w lumbar spine extension]
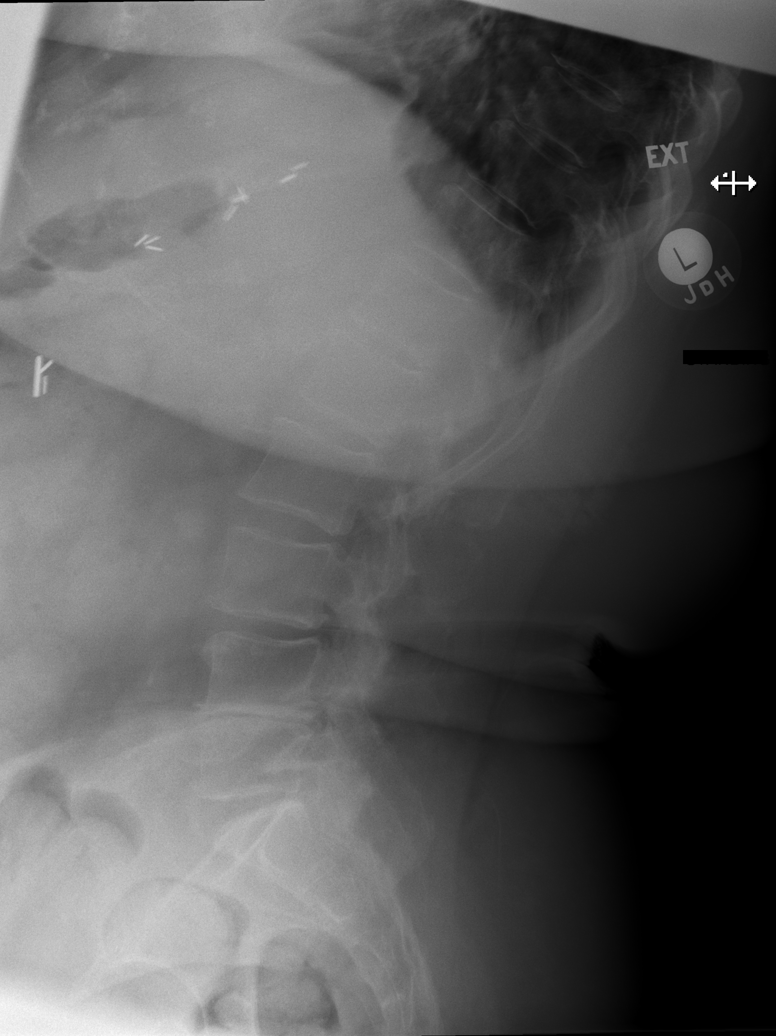

[w lumbar l-5 s-1 spot]
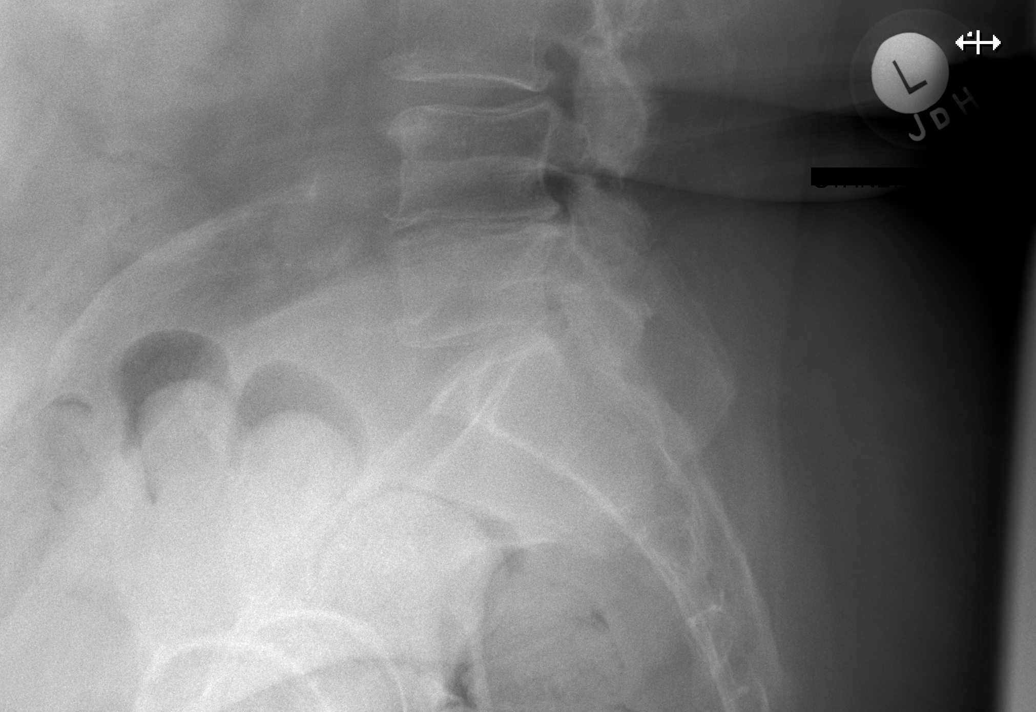

[7 of 7 positions shown; findings below may reference images not displayed]

FINDINGS: Advanced degenerative disc disease, near complete loss lobe
interspace height. Mild to moderate loss of height L3-4. Lower
lumbar facet arthropathy.

With patient standing in neutral, 4 mm anterolisthesis at L3-4 is
noted. This increases to 5 mm in flexion and decreases to 2 mm in
extension. Anatomic alignment elsewhere at the other lumbar levels.
IMPRESSION: Mild dynamic instability at the L3-4 level, up to 5 mm
anterolisthesis with patient standing in flexion. This is facet
mediated, based on plain film appearance as well as review of MR.

Severe degenerative disease L4-5, near complete loss of interspace
height.

## 2020-12-09 DIAGNOSIS — E039 Hypothyroidism, unspecified: Secondary | ICD-10-CM | POA: Diagnosis not present

## 2020-12-09 DIAGNOSIS — I10 Essential (primary) hypertension: Secondary | ICD-10-CM | POA: Diagnosis not present

## 2020-12-09 DIAGNOSIS — E7800 Pure hypercholesterolemia, unspecified: Secondary | ICD-10-CM | POA: Diagnosis not present

## 2020-12-09 DIAGNOSIS — E1169 Type 2 diabetes mellitus with other specified complication: Secondary | ICD-10-CM | POA: Diagnosis not present

## 2020-12-09 DIAGNOSIS — M8580 Other specified disorders of bone density and structure, unspecified site: Secondary | ICD-10-CM | POA: Diagnosis not present

## 2020-12-09 DIAGNOSIS — E785 Hyperlipidemia, unspecified: Secondary | ICD-10-CM | POA: Diagnosis not present

## 2020-12-09 DIAGNOSIS — K219 Gastro-esophageal reflux disease without esophagitis: Secondary | ICD-10-CM | POA: Diagnosis not present

## 2020-12-09 DIAGNOSIS — J45909 Unspecified asthma, uncomplicated: Secondary | ICD-10-CM | POA: Diagnosis not present

## 2020-12-15 ENCOUNTER — Telehealth: Payer: Self-pay | Admitting: Neurology

## 2020-12-15 ENCOUNTER — Encounter: Payer: Self-pay | Admitting: Neurology

## 2020-12-15 ENCOUNTER — Ambulatory Visit: Payer: Medicare Other | Admitting: Neurology

## 2020-12-15 VITALS — BP 142/70 | HR 97 | Ht <= 58 in | Wt 230.0 lb

## 2020-12-15 DIAGNOSIS — G35 Multiple sclerosis: Secondary | ICD-10-CM | POA: Diagnosis not present

## 2020-12-15 DIAGNOSIS — E119 Type 2 diabetes mellitus without complications: Secondary | ICD-10-CM | POA: Diagnosis not present

## 2020-12-15 DIAGNOSIS — F411 Generalized anxiety disorder: Secondary | ICD-10-CM

## 2020-12-15 DIAGNOSIS — F32A Depression, unspecified: Secondary | ICD-10-CM

## 2020-12-15 DIAGNOSIS — R269 Unspecified abnormalities of gait and mobility: Secondary | ICD-10-CM

## 2020-12-15 DIAGNOSIS — M4326 Fusion of spine, lumbar region: Secondary | ICD-10-CM | POA: Diagnosis not present

## 2020-12-15 MED ORDER — SERTRALINE HCL 100 MG PO TABS: 200.0000 mg | ORAL_TABLET | Freq: Every day | ORAL | 3 refills | Status: DC

## 2020-12-15 NOTE — Progress Notes (Signed)
[facility]  PATIENT: Jocelyn Wilson DOB: 15-Aug-1951  REFERRING DOCTOR OR PCP:  Adaku Nnodi SOURCE: Patient  _________________________________   HISTORICAL  CHIEF COMPLAINT:  Chief Complaint  Patient presents with  . Follow-up    RM 13, alone. Last seen 06/11/2020. Off DMT for MS. Ambulates with rolling walker.     HISTORY OF PRESENT ILLNESS:  Jocelyn Wilson is a 70 y.o. woman with multiple sclerosis, back pain (facet syndrome) s/p fusion, and migraines.  Update 12/15/2020: She is reporting LBP that did not improve much after surgery .  The surgery was Left L3L4 anterior lateral retroperitoneal interbody decompression.    More recently Dr. Annette Stable did x-rays with flexion and extension and she was told the fusion looked good.   She takes tramadol rarely, just about once a week.   It makes her sleepy but does help pain.     She is doing PT.    Gluteus massage has helped.   She is on tramadol and just takes prn with benefit.   She takes only a few a month.   She also has MS which has been stable off of Betaseron (since 2019).   She was on Betaseron  > 10 years.   She had right ON in the past and currently has no sequelae (had minimal).   She has a low plaque burden and has had no new lesions x years.     She reports continued depression and anxiety.  The anxiety can be severe at times..  She is on BuSpar, and Zoloft.  She has right knee arthritis and is seeing Ortho (Dr. Veverly Fells) next week.   PT sometimes makes it flare up   MS History:   Her MS was diagnosed around 2004.    She was actually seeing me for migraine headache when she presented with optic neuritis and vertigo and had another MRI.   She had interval development of multiple new foci and underwent a lumbar puncture.  CSF was also consistent with MS.   She was placed on Betaseron and continues on that medication with good control of the MS. No definite exacerbations.  Due to long-term debility, duration of  her MS and her age, the decision was made to stop the Betaseron in 2019.  She has not had any exacerbations.  IMAGING 11/03/2018 MRI of the lumbar spine shows that there has been significant changes at L3-L4 noted on the current MRI that have progressed compared to the 2017 MRI.  Specifically, she now has spinal stenosis at L3-L4 with an AP diameter of 8.2 mm.  That diameter was 10.8 mm in 2017.  Additionally, there is about 2 mm of anterolisthesis likely due to the facet hypertrophy that has progressed.  I believe it is playing some role in her pain.     11/03/2018 MRI of the brain shows that the MS is mild and stable.  She has a 12-millimeter extra-axial mass in the olfactory groove on the right that was not present on MRI 11/09/2017.  It is hyperintense on T2 and FLAIR images and hypointense on T1-weighted images.  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  I am concerned about the rapid growth from not being visible to 12 mm in just 1 year.  I recommend that we repeat the MRI.   She has had an allergic reaction to contrast in the past so we would do the study without.  11/13/2018  Lumbar flex/ext  IMPRESSION: Mild dynamic instability  at the L3-4 level, up to 5 mm anterolisthesis with patient standing in flexion. This is facet mediated, based on plain film appearance as well as review of MR.  Severe degenerative disease L4-5, near complete loss of interspace Height.  05/15/2019 MRI of the brain shows stable MS.  The extra-axial mass in the right olfactory groove (the study is also noncontrasted due to patient's intolerance of contrast in the past) actually appears a little smaller than on the earlier MRI.    REVIEW OF SYSTEMS: Constitutional: No fevers, chills, sweats, or change in appetite Eyes: No visual changes, double vision, eye pain Ear, nose and throat: No hearing loss, ear pain, nasal congestion, sore throat Cardiovascular: No chest pain, palpitations Respiratory: No  shortness of breath at rest or with exertion.   No wheezes GastrointestinaI: No nausea, vomiting, diarrhea, abdominal pain, fecal incontinence Genitourinary: No dysuria, urinary retention or frequency.  No nocturia. Musculoskeletal: No neck pain, back pain Integumentary: No rash, pruritus, skin lesions Neurological: as above Psychiatric: No depression at this time.  No anxiety Endocrine: No palpitations, diaphoresis, change in appetite, change in weigh or increased thirst Hematologic/Lymphatic: No anemia, purpura, petechiae. Allergic/Immunologic: No itchy/runny eyes, nasal congestion, recent allergic reactions, rashes  ALLERGIES: Allergies  Allergen Reactions  . Iron Anaphylaxis    Iv iron  . Ciprofloxacin Nausea Only  . Contrast Media [Iodinated Diagnostic Agents] Itching and Rash  . Latex Rash  . Other Other (See Comments)    Raw apples. Cucumbers, hazelnuts, Raw vegetables, Raw fruits, except citrus    . Sulfa Antibiotics Itching and Rash  . Tape Rash    Adhesive tape    HOME MEDICATIONS:  Current Outpatient Medications:  .  atorvastatin (LIPITOR) 20 MG tablet, Take 20 mg by mouth at bedtime. , Disp: , Rfl:  .  busPIRone (BUSPAR) 30 MG tablet, TAKE 1 TABLET BY MOUTH 3  TIMES DAILY, Disp: 270 tablet, Rfl: 1 .  Cyanocobalamin (VITAMIN B12) 1000 MCG TBCR, Take 1,000 mcg by mouth daily. , Disp: , Rfl:  .  esomeprazole (NEXIUM) 40 MG capsule, TAKE 1 CAPSULE BY MOUTH  TWICE DAILY, Disp: 180 capsule, Rfl: 3 .  ferrous sulfate 325 (65 FE) MG tablet, Take 325 mg by mouth daily. , Disp: , Rfl:  .  furosemide (LASIX) 20 MG tablet, Take 20 mg by mouth daily., Disp: , Rfl:  .  Ibuprofen 200 MG CAPS, Take 400 mg by mouth 3 (three) times daily as needed (headache or migraine). , Disp: , Rfl:  .  Insulin Pen Needle (BD PEN NEEDLE NANO U/F) 32G X 4 MM MISC, 1 Package by Does not apply route 2 (two) times daily., Disp: 100 each, Rfl: 0 .  levothyroxine (SYNTHROID) 137 MCG tablet, Take 137  mcg by mouth daily before breakfast. , Disp: , Rfl:  .  liraglutide (VICTOZA) 18 MG/3ML SOPN, ADMINISTER 0.6 MG UNDER THE SKIN EVERY MORNING (Patient taking differently: Inject 0.6 mg into the skin daily with breakfast.), Disp: 3 mL, Rfl: 0 .  meclizine (ANTIVERT) 12.5 MG tablet, TAKE 1 TABLET(12.5 MG) BY MOUTH THREE TIMES DAILY AS NEEDED FOR DIZZINESS (Patient taking differently: Take 12.5 mg by mouth 3 (three) times daily as needed for dizziness. TAKE 1 TABLET(12.5 MG) BY MOUTH THREE TIMES DAILY AS NEEDED FOR DIZZINESS), Disp: 270 tablet, Rfl: 1 .  metFORMIN (GLUCOPHAGE-XR) 500 MG 24 hr tablet, Take 1,000 mg by mouth 2 (two) times daily., Disp: , Rfl:  .  metoprolol succinate (TOPROL-XL) 50 MG 24 hr  tablet, TAKE 1 TABLET BY MOUTH  DAILY (Patient taking differently: Take 50 mg by mouth daily.), Disp: 90 tablet, Rfl: 3 .  ONE TOUCH ULTRA TEST test strip, USE TO CHECK FASTING AND PRE-DINNER BLOOD GLUCOSE BID, Disp: , Rfl: 12 .  ONETOUCH DELICA LANCETS 81E MISC, USE TO CHECK FASTING AND PRE-DINNER BLOOD GLUCOSE BID, Disp: , Rfl: 12 .  potassium citrate (UROCIT-K) 10 MEQ (1080 MG) SR tablet, Take 20 mEq by mouth 3 (three) times daily with meals. , Disp: , Rfl:  .  rOPINIRole (REQUIP) 1 MG tablet, TAKE 1 TABLET BY MOUTH 4  TIMES DAILY, Disp: 360 tablet, Rfl: 3 .  traMADol (ULTRAM) 50 MG tablet, TAKE 1 TABLET BY MOUTH 3  TIMES DAILY AS NEEDED, Disp: 90 tablet, Rfl: 5 .  traZODone (DESYREL) 150 MG tablet, Take 50 mg by mouth at bedtime., Disp: , Rfl:  .  VITAMIN D PO, Take 5,000 Units by mouth daily. , Disp: , Rfl:  .  sertraline (ZOLOFT) 100 MG tablet, Take 2 tablets (200 mg total) by mouth daily., Disp: 180 tablet, Rfl: 3  PAST MEDICAL HISTORY: Past Medical History:  Diagnosis Date  . Allergy   . Anxiety   . Arthritis   . Asthma    does not use an inhaer- patient does not think she does  . Back pain   . Chronic kidney disease    KIDNEY STONES WITH STENTS  . Depression   . Diabetes ([place])     type II  . Diverticula of colon   . Dyspnea    if climbing steps  . Eczema   . Food allergy   . Gallbladder problem   . GERD (gastroesophageal reflux disease)   . History of kidney stones   . Hypercholesterolemia   . Hyperlipidemia   . Hypothyroidism   . Joint pain   . Kidney stones   . Leg edema   . Migraine headache    with pressure change  . Multiple sclerosis ([place])   . Obesity   . Palpitations   . PONV (postoperative nausea and vomiting)    with breast reduction surgery in [place] from the ~ 1980's/1990's  . Restless leg syndrome   . Right sided sciatica 10/29/2015  . Snoring   . Thyroid disease    hyperthyroidism  . Unsteady gait   . Vertigo   . Vision abnormalities     PAST SURGICAL HISTORY: Past Surgical History:  Procedure Laterality Date  . ANTERIOR LATERAL LUMBAR FUSION WITH PERCUTANEOUS SCREW 1 LEVEL Left 02/22/2020   Procedure: Anterior Lateral Lumbar Fusion - Lumbar Three-Lumbar Four with percutaneous pedicle screws;  Surgeon: Earnie Larsson, MD;  Location: [facility];  Service: Neurosurgery;  Laterality: Left;  Anterior Lateral Lumbar Fusion - Lumbar Three-Lumbar Four with percutaneous pedicle screws  . BREAST SURGERY     AUGMENTATION  . carpel tunnel Bilateral   . CHOLECYSTECTOMY    . DILATION AND CURETTAGE OF UTERUS  1983  . ESOPHAGOGASTRODUODENOSCOPY    . FINGER SURGERY  x8    trigger finger all except left pinky- right pinky didnt work  . LITHOTRIPSY    . NISSEN FUNDOPLICATION    . VENTRAL HERNIA REPAIR  07/25/2012   Procedure: LAPAROSCOPIC VENTRAL HERNIA;  Surgeon: Adin Hector, MD;  Location: [facility];  Service: General;  Laterality: N/A;    FAMILY HISTORY: Family History  Problem Relation Age of Onset  . Hyperlipidemia Mother   . Glaucoma Mother   . Hypertension Mother   .  Anxiety disorder Mother   . Depression Mother   . Heart disease Father   . Cancer Father        prostate  . Diabetes Father   . Dementia Father   . Hypertension Father   .  Hyperlipidemia Father   . Sleep apnea Father   . Obesity Father   . Cancer Brother        esophageal  . Arthritis Sister        RA    SOCIAL HISTORY:  Social History   Socioeconomic History  . Marital status: Married    Spouse name: Etter Royall  . Number of children: 1  . Years of education: Not on file  . Highest education level: Not on file  Occupational History  . Occupation: retired  Tobacco Use  . Smoking status: Former Smoker    Quit date: 1978    Years since quitting: 44.2  . Smokeless tobacco: Never Used  Vaping Use  . Vaping Use: Never used  Substance and Sexual Activity  . Alcohol use: No  . Drug use: No  . Sexual activity: Not on file  Other Topics Concern  . Not on file  Social History Narrative  . Not on file   Social Determinants of Health   Financial Resource Strain: Not on file  Food Insecurity: Not on file  Transportation Needs: Not on file  Physical Activity: Not on file  Stress: Not on file  Social Connections: Not on file  Intimate Partner Violence: Not on file     PHYSICAL EXAM  Vitals:   12/15/20 1125  BP: (!) 142/70  Pulse: 97  SpO2: 98%  Weight: 230 lb (104.3 kg)  Height: 4\' 9"  (1.448 m)    Body mass index is 49.77 kg/m.   General: The patient is well-developed and well-nourished and in no acute distress  Musculoskeletal:  Lower lumbar paraspinals are mildly tender.  Postoperative changes.  She has reduced range of motion in the back.  Neurologic Exam  Mental status: The patient is alert and oriented x 3 at the time of the examination. The patient has apparent normal recent and remote memory, with an apparently normal attention span and concentration ability.   Speech is normal.  Cranial nerves: Extraocular movements are full.  Facial strength and sensation is normal.  Trapezius strength is normal.  The tongue is midline, and the patient has symmetric elevation of the soft palate. No obvious hearing deficits are  noted.  Motor:  Muscle bulk is normal.   Tone is normal. Strength is  5 / 5 in all 4 extremities.   Sensory: Sensory testing is intact to soft touch.     Gait and station: Station is normal.  Gait is arthritic and wide.  She is unable to do tandem walk.    Romberg negative  Reflexes: Deep tendon reflexes are symmetric and normal bilaterally.     ______________________________________________  Multiple sclerosis ([place]) - Plan: MR BRAIN WO CONTRAST  Depression, unspecified depression type  Gait disturbance - Plan: MR BRAIN WO CONTRAST  Fusion of lumbar spine  Anxiety state  Type 2 diabetes mellitus without complication, without long-term current use of insulin ([place])  1.   Her back pain has stabilized.  She can take tramadol as needed and consider a muscle relaxant if worsens 2.   No new MS issues.   She is off of Betaseron and she will continue off of a disease modifying therapy for now.   3.  Stay active and exercise as tolerated. 4. Return in 6 months or sooner if there are new or worsening neurologic symptoms.   Richard A. Felecia Shelling, MD, PhD 11/16/4156, 3:09 PM Certified in Neurology, Clinical Neurophysiology, Sleep Medicine, Pain Medicine and Neuroimaging  [facility] [address], [place], [place] [address] [phone]  ;,

## 2020-12-15 NOTE — Telephone Encounter (Signed)
[employer] medicare order sent to [facility]. No auth they will reach out to the patient to schedule.  

## 2020-12-23 DIAGNOSIS — M13862 Other specified arthritis, left knee: Secondary | ICD-10-CM | POA: Diagnosis not present

## 2020-12-23 DIAGNOSIS — M13861 Other specified arthritis, right knee: Secondary | ICD-10-CM | POA: Diagnosis not present

## 2020-12-27 ENCOUNTER — Other Ambulatory Visit: Payer: Self-pay

## 2020-12-27 ENCOUNTER — Ambulatory Visit
Admission: RE | Admit: 2020-12-27 | Discharge: 2020-12-27 | Disposition: A | Discharge status: Home / Self Care | Payer: Medicare Other | Source: Ambulatory Visit | Attending: Neurology | Admitting: Neurology

## 2020-12-27 DIAGNOSIS — R269 Unspecified abnormalities of gait and mobility: Secondary | ICD-10-CM

## 2020-12-27 DIAGNOSIS — G35 Multiple sclerosis: Secondary | ICD-10-CM | POA: Diagnosis not present

## 2021-01-14 DIAGNOSIS — M4310 Spondylolisthesis, site unspecified: Secondary | ICD-10-CM | POA: Diagnosis not present

## 2021-01-27 DIAGNOSIS — L03116 Cellulitis of left lower limb: Secondary | ICD-10-CM | POA: Diagnosis not present

## 2021-01-29 DIAGNOSIS — I83813 Varicose veins of bilateral lower extremities with pain: Secondary | ICD-10-CM | POA: Diagnosis not present

## 2021-02-02 DIAGNOSIS — I872 Venous insufficiency (chronic) (peripheral): Secondary | ICD-10-CM | POA: Diagnosis not present

## 2021-02-02 DIAGNOSIS — E1169 Type 2 diabetes mellitus with other specified complication: Secondary | ICD-10-CM | POA: Diagnosis not present

## 2021-02-02 DIAGNOSIS — Z7984 Long term (current) use of oral hypoglycemic drugs: Secondary | ICD-10-CM | POA: Diagnosis not present

## 2021-02-02 DIAGNOSIS — I10 Essential (primary) hypertension: Secondary | ICD-10-CM | POA: Diagnosis not present

## 2021-02-02 DIAGNOSIS — N200 Calculus of kidney: Secondary | ICD-10-CM | POA: Diagnosis not present

## 2021-02-04 DIAGNOSIS — Z1231 Encounter for screening mammogram for malignant neoplasm of breast: Secondary | ICD-10-CM | POA: Diagnosis not present

## 2021-02-16 DIAGNOSIS — H8110 Benign paroxysmal vertigo, unspecified ear: Secondary | ICD-10-CM | POA: Diagnosis not present

## 2021-02-16 DIAGNOSIS — I872 Venous insufficiency (chronic) (peripheral): Secondary | ICD-10-CM | POA: Diagnosis not present

## 2021-02-16 DIAGNOSIS — I10 Essential (primary) hypertension: Secondary | ICD-10-CM | POA: Diagnosis not present

## 2021-02-23 ENCOUNTER — Other Ambulatory Visit: Payer: Self-pay | Admitting: *Deleted

## 2021-02-23 MED ORDER — MECLIZINE HCL 12.5 MG PO TABS: ORAL_TABLET | ORAL | 0 refills | Status: DC

## 2021-02-24 DIAGNOSIS — L308 Other specified dermatitis: Secondary | ICD-10-CM | POA: Diagnosis not present

## 2021-02-24 DIAGNOSIS — L821 Other seborrheic keratosis: Secondary | ICD-10-CM | POA: Diagnosis not present

## 2021-02-24 DIAGNOSIS — L820 Inflamed seborrheic keratosis: Secondary | ICD-10-CM | POA: Diagnosis not present

## 2021-02-24 DIAGNOSIS — I8310 Varicose veins of unspecified lower extremity with inflammation: Secondary | ICD-10-CM | POA: Diagnosis not present

## 2021-02-24 DIAGNOSIS — L853 Xerosis cutis: Secondary | ICD-10-CM | POA: Diagnosis not present

## 2021-02-27 ENCOUNTER — Telehealth: Payer: Self-pay | Admitting: Neurology

## 2021-02-27 NOTE — Telephone Encounter (Signed)
I returned patient's call to our answering service and spoke to her and her daughter and husband.  Patient was woken up by her dog from deep sleep and apparently was disoriented and confused for a while.  She had gradually returned back to her baseline.  The daughter who was eyewitness stated there was no slurred speech, facial weakness, gait imbalance or focal weakness.  Patient confessed that if she wakes up suddenly from sleep it takes her a while to get her bearings right and this is happened before.  I recommended less conservative follow-up for now but if this happens again or she has new focal neurological symptoms to call our office or go to the emergency room.  She voiced understanding.

## 2021-02-27 NOTE — Telephone Encounter (Signed)
Pt was woken up from a deep sleep by the dog this morning and now she is having confusion and problems with her short term memory. Pt's daughter would like the on call provider to call the pt. Please advise.

## 2021-03-02 DIAGNOSIS — E039 Hypothyroidism, unspecified: Secondary | ICD-10-CM | POA: Diagnosis not present

## 2021-03-02 DIAGNOSIS — E785 Hyperlipidemia, unspecified: Secondary | ICD-10-CM | POA: Diagnosis not present

## 2021-03-02 DIAGNOSIS — E1169 Type 2 diabetes mellitus with other specified complication: Secondary | ICD-10-CM | POA: Diagnosis not present

## 2021-03-02 DIAGNOSIS — E7800 Pure hypercholesterolemia, unspecified: Secondary | ICD-10-CM | POA: Diagnosis not present

## 2021-03-02 DIAGNOSIS — M8580 Other specified disorders of bone density and structure, unspecified site: Secondary | ICD-10-CM | POA: Diagnosis not present

## 2021-03-02 DIAGNOSIS — I10 Essential (primary) hypertension: Secondary | ICD-10-CM | POA: Diagnosis not present

## 2021-03-02 DIAGNOSIS — K219 Gastro-esophageal reflux disease without esophagitis: Secondary | ICD-10-CM | POA: Diagnosis not present

## 2021-03-02 DIAGNOSIS — J45909 Unspecified asthma, uncomplicated: Secondary | ICD-10-CM | POA: Diagnosis not present

## 2021-03-02 NOTE — Telephone Encounter (Signed)
FYI

## 2021-03-03 DIAGNOSIS — N3941 Urge incontinence: Secondary | ICD-10-CM | POA: Insufficient documentation

## 2021-04-14 ENCOUNTER — Telehealth: Payer: Self-pay | Admitting: Neurology

## 2021-04-14 NOTE — Telephone Encounter (Signed)
Called pt back. Advised rx meclizine sent 02/23/21 #270 (90days). [facility] should have this on file if she did not fill this. Bottle she has is from 2017. She will follow up with [facility]. Nothing further needed.

## 2021-04-14 NOTE — Telephone Encounter (Signed)
Pt request refill meclizine (ANTIVERT) 12.5 MG tablet at [facility] #[id number]. Want to know he can do a 90-day supply

## 2021-05-13 DIAGNOSIS — M4310 Spondylolisthesis, site unspecified: Secondary | ICD-10-CM | POA: Diagnosis not present

## 2021-05-13 DIAGNOSIS — I10 Essential (primary) hypertension: Secondary | ICD-10-CM | POA: Diagnosis not present

## 2021-05-16 ENCOUNTER — Other Ambulatory Visit: Payer: Self-pay | Admitting: Neurology

## 2021-05-18 ENCOUNTER — Other Ambulatory Visit: Payer: Self-pay | Admitting: Neurosurgery

## 2021-05-18 DIAGNOSIS — M4310 Spondylolisthesis, site unspecified: Secondary | ICD-10-CM

## 2021-05-23 ENCOUNTER — Ambulatory Visit
Admission: RE | Admit: 2021-05-23 | Discharge: 2021-05-23 | Disposition: A | Discharge status: Home / Self Care | Payer: Medicare Other | Source: Ambulatory Visit | Attending: Neurosurgery | Admitting: Neurosurgery

## 2021-05-23 ENCOUNTER — Other Ambulatory Visit: Payer: Self-pay

## 2021-05-23 DIAGNOSIS — M545 Low back pain, unspecified: Secondary | ICD-10-CM | POA: Diagnosis not present

## 2021-05-23 DIAGNOSIS — M4310 Spondylolisthesis, site unspecified: Secondary | ICD-10-CM

## 2021-06-03 DIAGNOSIS — E7800 Pure hypercholesterolemia, unspecified: Secondary | ICD-10-CM | POA: Diagnosis not present

## 2021-06-03 DIAGNOSIS — M170 Bilateral primary osteoarthritis of knee: Secondary | ICD-10-CM | POA: Diagnosis not present

## 2021-06-03 DIAGNOSIS — I10 Essential (primary) hypertension: Secondary | ICD-10-CM | POA: Diagnosis not present

## 2021-06-03 DIAGNOSIS — E1169 Type 2 diabetes mellitus with other specified complication: Secondary | ICD-10-CM | POA: Diagnosis not present

## 2021-06-03 DIAGNOSIS — J45909 Unspecified asthma, uncomplicated: Secondary | ICD-10-CM | POA: Diagnosis not present

## 2021-06-03 DIAGNOSIS — E039 Hypothyroidism, unspecified: Secondary | ICD-10-CM | POA: Diagnosis not present

## 2021-06-03 DIAGNOSIS — K219 Gastro-esophageal reflux disease without esophagitis: Secondary | ICD-10-CM | POA: Diagnosis not present

## 2021-06-03 DIAGNOSIS — M8580 Other specified disorders of bone density and structure, unspecified site: Secondary | ICD-10-CM | POA: Diagnosis not present

## 2021-06-03 DIAGNOSIS — E785 Hyperlipidemia, unspecified: Secondary | ICD-10-CM | POA: Diagnosis not present

## 2021-06-04 DIAGNOSIS — M47816 Spondylosis without myelopathy or radiculopathy, lumbar region: Secondary | ICD-10-CM | POA: Diagnosis not present

## 2021-06-04 DIAGNOSIS — I10 Essential (primary) hypertension: Secondary | ICD-10-CM | POA: Diagnosis not present

## 2021-06-18 ENCOUNTER — Ambulatory Visit: Payer: Medicare Other | Admitting: Neurology

## 2021-06-18 ENCOUNTER — Encounter: Payer: Self-pay | Admitting: Neurology

## 2021-06-18 ENCOUNTER — Other Ambulatory Visit: Payer: Self-pay

## 2021-06-18 VITALS — BP 152/79 | HR 86 | Ht <= 58 in | Wt 231.5 lb

## 2021-06-18 DIAGNOSIS — G35 Multiple sclerosis: Secondary | ICD-10-CM

## 2021-06-18 DIAGNOSIS — M47817 Spondylosis without myelopathy or radiculopathy, lumbosacral region: Secondary | ICD-10-CM | POA: Diagnosis not present

## 2021-06-18 DIAGNOSIS — M4696 Unspecified inflammatory spondylopathy, lumbar region: Secondary | ICD-10-CM | POA: Diagnosis not present

## 2021-06-18 DIAGNOSIS — M4326 Fusion of spine, lumbar region: Secondary | ICD-10-CM | POA: Diagnosis not present

## 2021-06-18 DIAGNOSIS — E119 Type 2 diabetes mellitus without complications: Secondary | ICD-10-CM | POA: Diagnosis not present

## 2021-06-18 DIAGNOSIS — M461 Sacroiliitis, not elsewhere classified: Secondary | ICD-10-CM | POA: Diagnosis not present

## 2021-06-18 DIAGNOSIS — F411 Generalized anxiety disorder: Secondary | ICD-10-CM

## 2021-06-18 DIAGNOSIS — R269 Unspecified abnormalities of gait and mobility: Secondary | ICD-10-CM

## 2021-06-18 NOTE — Progress Notes (Signed)
[facility]  PATIENT: Jocelyn Wilson DOB: Sep 09, 1951  REFERRING DOCTOR OR PCP:  Adaku Nnodi SOURCE: Patient  _________________________________   HISTORICAL  CHIEF COMPLAINT:  Chief Complaint  Patient presents with   Follow-up    Rm 2, w husband. Here for 6 month MS f/u, off DMT for MS. Pt continues to have back pn and constant dizziness. Pt had to 2 shots in her back at [facility] today.     HISTORY OF PRESENT ILLNESS:  Jocelyn Wilson is a 71 y.o. woman with multiple sclerosis, back pain (facet syndrome) s/p fusion, and migraines.  Update 06/18/2021: She is reporting LBP that did not improve much after surgery.   She is seeing [facility]  (Dr. Davy Pique - sounds lie MBB).  RFA has been discussed.   She noted a mild improvement only .  The surgery was Left L3L4 anterior lateral retroperitoneal interbody decompression. (Dr. Annette Stable).    More recently Dr. Annette Stable did x-rays with flexion and extension and she was told the fusion looked good.   She takes tramadol rarely, just about once or twice a week as it makes her sleepy.      She is doing PT.    Gluteus massage has helped.   She is on tramadol and just takes prn with benefit.   She takes only a few a month.   She has vertigo more translational than rotatory.   She gets car sick at times.    She also has MS which has been stable off of Betaseron (since 2019).   She was on Betaseron  > 10 years.   She had right ON in the past and currently has no sequelae (had minimal).   She has a low plaque burden and has had no new lesions x years.     She notes lightheadedness upon standing.     She reports continued depression and anxiety.  The anxiety can be severe at times..  She is on BuSpar, and Zoloft.   She occasionally bursts into tears.  She has right knee arthritis and is seeing Ortho (Dr. Veverly Fells) next week.   PT sometimes makes it flare up   MS History:   Her MS was diagnosed around 2004.    She was  actually seeing me for migraine headache when she presented with optic neuritis and vertigo and had another MRI.   She had interval development of multiple new foci and underwent a lumbar puncture.  CSF was also consistent with MS.   She was placed on Betaseron and continues on that medication with good control of the MS. No definite exacerbations.  Due to long-term debility, duration of her MS and her age, the decision was made to stop the Betaseron in 2019.  She has not had any exacerbations.  IMAGING 11/03/2018 MRI of the lumbar spine shows that there has been significant changes at L3-L4 noted on the current MRI that have progressed compared to the 2017 MRI.  Specifically, she now has spinal stenosis at L3-L4 with an AP diameter of 8.2 mm.  That diameter was 10.8 mm in 2017.  Additionally, there is about 2 mm of anterolisthesis likely due to the facet hypertrophy that has progressed.  I believe it is playing some role in her pain.     11/03/2018 MRI of the brain shows that the MS is mild and stable.  She has a 12-millimeter extra-axial mass in the olfactory groove on the right that was not present  on MRI 11/09/2017.  It is hyperintense on T2 and FLAIR images and hypointense on T1-weighted images.  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  I am concerned about the rapid growth from not being visible to 12 mm in just 1 year.  I recommend that we repeat the MRI.   She has had an allergic reaction to contrast in the past so we would do the study without.  11/13/2018  Lumbar flex/ext  IMPRESSION: Mild dynamic instability at the L3-4 level, up to 5 mm anterolisthesis with patient standing in flexion. This is facet mediated, based on plain film appearance as well as review of MR.  Severe degenerative disease L4-5, near complete loss of interspace Height.  05/15/2019 MRI of the brain shows stable MS.  The extra-axial mass in the right olfactory groove (the study is also noncontrasted due to  patient's intolerance of contrast in the past) actually appears a little smaller than on the earlier MRI.  04/15/2015 MRI of the cervical spie showed no MS lesions and did have DJD.      REVIEW OF SYSTEMS: Constitutional: No fevers, chills, sweats, or change in appetite Eyes: No visual changes, double vision, eye pain Ear, nose and throat: No hearing loss, ear pain, nasal congestion, sore throat Cardiovascular: No chest pain, palpitations Respiratory:  No shortness of breath at rest or with exertion.   No wheezes GastrointestinaI: No nausea, vomiting, diarrhea, abdominal pain, fecal incontinence Genitourinary:  No dysuria, urinary retention or frequency.  No nocturia. Musculoskeletal:  No neck pain, back pain Integumentary: No rash, pruritus, skin lesions Neurological: as above Psychiatric: No depression at this time.  No anxiety Endocrine: No palpitations, diaphoresis, change in appetite, change in weigh or increased thirst Hematologic/Lymphatic:  No anemia, purpura, petechiae. Allergic/Immunologic: No itchy/runny eyes, nasal congestion, recent allergic reactions, rashes  ALLERGIES: Allergies  Allergen Reactions   Iron Anaphylaxis    Iv iron   Ciprofloxacin Nausea Only   Contrast Media [Iodinated Diagnostic Agents] Itching and Rash   Latex Rash   Other Other (See Comments)    Raw apples. Cucumbers, hazelnuts, Raw vegetables, Raw fruits, except citrus     Sulfa Antibiotics Itching and Rash   Tape Rash    Adhesive tape    HOME MEDICATIONS:  Current Outpatient Medications:    atorvastatin (LIPITOR) 20 MG tablet, Take 20 mg by mouth at bedtime. , Disp: , Rfl:    busPIRone (BUSPAR) 30 MG tablet, Take 1 tablet (30 mg total) by mouth 3 (three) times daily. Please keep upcoming appt for further refills, Disp: 270 tablet, Rfl: 0   Cyanocobalamin (VITAMIN B12) 1000 MCG TBCR, Take 1,000 mcg by mouth daily. , Disp: , Rfl:    esomeprazole (NEXIUM) 40 MG capsule, TAKE 1 CAPSULE BY MOUTH   TWICE DAILY, Disp: 180 capsule, Rfl: 3   ferrous sulfate 325 (65 FE) MG tablet, Take 325 mg by mouth daily. , Disp: , Rfl:    furosemide (LASIX) 20 MG tablet, Take 20 mg by mouth daily., Disp: , Rfl:    Ibuprofen 200 MG CAPS, Take 400 mg by mouth 3 (three) times daily as needed (headache or migraine). , Disp: , Rfl:    Insulin Pen Needle (BD PEN NEEDLE NANO U/F) 32G X 4 MM MISC, 1 Package by Does not apply route 2 (two) times daily., Disp: 100 each, Rfl: 0   levothyroxine (SYNTHROID) 137 MCG tablet, Take 137 mcg by mouth daily before breakfast. , Disp: , Rfl:    liraglutide (  VICTOZA) 18 MG/3ML SOPN, ADMINISTER 0.6 MG UNDER THE SKIN EVERY MORNING (Patient taking differently: Inject 0.6 mg into the skin daily with breakfast.), Disp: 3 mL, Rfl: 0   meclizine (ANTIVERT) 12.5 MG tablet, TAKE 1 TABLET BY MOUTH 3  TIMES DAILY AS NEEDED FOR  DIZZINESS, Disp: 270 tablet, Rfl: 0   metFORMIN (GLUCOPHAGE-XR) 500 MG 24 hr tablet, Take 1,000 mg by mouth 2 (two) times daily., Disp: , Rfl:    metoprolol succinate (TOPROL-XL) 50 MG 24 hr tablet, TAKE 1 TABLET BY MOUTH  DAILY (Patient taking differently: Take 50 mg by mouth daily.), Disp: 90 tablet, Rfl: 3   ONE TOUCH ULTRA TEST test strip, USE TO CHECK FASTING AND PRE-DINNER BLOOD GLUCOSE BID, Disp: , Rfl: 12   ONETOUCH DELICA LANCETS [id number] MISC, USE TO CHECK FASTING AND PRE-DINNER BLOOD GLUCOSE BID, Disp: , Rfl: 12   potassium citrate (UROCIT-K) 10 MEQ (1080 MG) SR tablet, Take 20 mEq by mouth 3 (three) times daily with meals. , Disp: , Rfl:    rOPINIRole (REQUIP) 1 MG tablet, Take 1 tablet (1 mg total) by mouth 4 (four) times daily. Please keep upcoming appt for further refills, Disp: 360 tablet, Rfl: 0   sertraline (ZOLOFT) 100 MG tablet, Take 2 tablets (200 mg total) by mouth daily., Disp: 180 tablet, Rfl: 3   traMADol (ULTRAM) 50 MG tablet, TAKE 1 TABLET BY MOUTH 3  TIMES DAILY AS NEEDED, Disp: 90 tablet, Rfl: 5   traZODone (DESYREL) 150 MG tablet, Take 50 mg by  mouth at bedtime., Disp: , Rfl:    VITAMIN D PO, Take 5,000 Units by mouth daily. , Disp: , Rfl:   PAST MEDICAL HISTORY: Past Medical History:  Diagnosis Date   Allergy    Anxiety    Arthritis    Asthma    does not use an inhaer- patient does not think she does   Back pain    Chronic kidney disease    KIDNEY STONES WITH STENTS   Depression    Diabetes ([place])    type II   Diverticula of colon    Dyspnea    if climbing steps   Eczema    Food allergy    Gallbladder problem    GERD (gastroesophageal reflux disease)    History of kidney stones    Hypercholesterolemia    Hyperlipidemia    Hypothyroidism    Joint pain    Kidney stones    Leg edema    Migraine headache    with pressure change   Multiple sclerosis (HCC)    Obesity    Palpitations    PONV (postoperative nausea and vomiting)    with breast reduction surgery in [place] from the ~ 1980's/1990's   Restless leg syndrome    Right sided sciatica 10/29/2015   Snoring    Thyroid disease    hyperthyroidism   Unsteady gait    Vertigo    Vision abnormalities     PAST SURGICAL HISTORY: Past Surgical History:  Procedure Laterality Date   ANTERIOR LATERAL LUMBAR FUSION WITH PERCUTANEOUS SCREW 1 LEVEL Left 02/22/2020   Procedure: Anterior Lateral Lumbar Fusion - Lumbar Three-Lumbar Four with percutaneous pedicle screws;  Surgeon: Earnie Larsson, MD;  Location: [facility];  Service: Neurosurgery;  Laterality: Left;  Anterior Lateral Lumbar Fusion - Lumbar Three-Lumbar Four with percutaneous pedicle screws   BREAST SURGERY     AUGMENTATION   carpel tunnel Bilateral    CHOLECYSTECTOMY     DILATION AND  CURETTAGE OF UTERUS  1983   ESOPHAGOGASTRODUODENOSCOPY     FINGER SURGERY  x8    trigger finger all except left pinky- right pinky didnt work   LITHOTRIPSY     NISSEN FUNDOPLICATION     VENTRAL HERNIA REPAIR  07/25/2012   Procedure: LAPAROSCOPIC VENTRAL HERNIA;  Surgeon: Adin Hector, MD;  Location: [facility];  Service: General;   Laterality: N/A;    FAMILY HISTORY: Family History  Problem Relation Age of Onset   Hyperlipidemia Mother    Glaucoma Mother    Hypertension Mother    Anxiety disorder Mother    Depression Mother    Heart disease Father    Cancer Father        prostate   Diabetes Father    Dementia Father    Hypertension Father    Hyperlipidemia Father    Sleep apnea Father    Obesity Father    Cancer Brother        esophageal   Arthritis Sister        RA    SOCIAL HISTORY:  Social History   Socioeconomic History   Marital status: Married    Spouse name: Tawni Wolper   Number of children: 1   Years of education: Not on file   Highest education level: Not on file  Occupational History   Occupation: retired  Tobacco Use   Smoking status: Former    Types: Cigarettes    Quit date: 1978    Years since quitting: 44.7   Smokeless tobacco: Never  Vaping Use   Vaping Use: Never used  Substance and Sexual Activity   Alcohol use: No   Drug use: No   Sexual activity: Not on file  Other Topics Concern   Not on file  Social History Narrative   Not on file   Social Determinants of Health   Financial Resource Strain: Not on file  Food Insecurity: Not on file  Transportation Needs: Not on file  Physical Activity: Not on file  Stress: Not on file  Social Connections: Not on file  Intimate Partner Violence: Not on file     PHYSICAL EXAM  Vitals:   06/18/21 1135  BP: (!) 152/79  Pulse: 86  Weight: 231 lb 8 oz (105 kg)  Height: '4\' 9"'$  (1.448 m)    Body mass index is 50.1 kg/m.   General: The patient is well-developed and well-nourished and in no acute distress  Musculoskeletal:  Lower lumbar paraspinals are mildly tender.  Postoperative changes.  She has reduced range of motion in the back.  Neurologic Exam  Mental status: The patient is alert and oriented x 3 at the time of the examination. The patient has apparent normal recent and remote memory, with an apparently  normal attention span and concentration ability.   Speech is normal.  Cranial nerves: Extraocular movements are full.  Facial strength and sensation is normal.  Trapezius strength is normal.  Hearing is symmetric.  Weber does not lateralize.  Motor:  Muscle bulk is normal.   Tone is normal. Strength is  5 / 5 in all 4 extremities.   Sensory: Sensory testing is intact to soft touch.     Gait and station: Station is normal.  Gait is arthritic and wide.  She can walk without her walker.  She is unable to do tandem walk.    Romberg negative  Reflexes: Deep tendon reflexes are symmetric and normal bilaterally.     ______________________________________________  Multiple  sclerosis (HCC)  Fusion of lumbar spine  Gait disturbance  Lumbosacral facet joint syndrome  Anxiety state  Type 2 diabetes mellitus without complication, without long-term current use of insulin ([place])  1.    She can take tramadol as needed for back.   F/u with [facility].  2.   No new MS issues.   She is off of Betaseron and she will continue off of a disease modifying therapy for now.   3.   Increase meclizine to tid.    For long car trip consider also adding dramamine. 4.  Stay active and exercise as tolerated. 5.   Return in 6 months or sooner if there are new or worsening neurologic symptoms.   Josiah Nieto A. Felecia Shelling, MD, PhD [id number], [id number] PM Certified in Neurology, Clinical Neurophysiology, Sleep Medicine, Pain Medicine and Neuroimaging  [facility] [address], [place], [place] [address] [phone]  ;,

## 2021-06-22 ENCOUNTER — Telehealth: Payer: Self-pay | Admitting: Neurology

## 2021-06-22 MED ORDER — MECLIZINE HCL 25 MG PO TABS: 25.0000 mg | ORAL_TABLET | Freq: Three times a day (TID) | ORAL | 1 refills | Status: DC | PRN

## 2021-06-22 NOTE — Telephone Encounter (Signed)
I called patient she looked at her bottle and she is on meclizine 12.5 mg tablets 3 times a day as needed. Per your note you wanted to increase her meclizine to 3 times a day on a regular basis.  Per patient when she was in the office there was some question of her being on a stronger strength of medication.  She just wanted to clarify to make sure that you wanted her to remain on the 12.5 mg but take it 3 times a day on a regular basis.

## 2021-06-22 NOTE — Telephone Encounter (Signed)
Spoke with patient relayed Dr. Garth Bigness recommendation where she could take meclizine 25 mg 3 times a day.   Until she has new prescription she can take 2 tabs of the 12.'5mg'$  tablets TID.  Pt appreciated call back. Wanted 90 day supply to [facility].  I filled prescription.

## 2021-06-22 NOTE — Telephone Encounter (Signed)
Pt called, I think we discussed at the office visit to change meclizine to 20 mg. Need a new prescription sent to [facility]  [facility]). Would like a call from the nurse.

## 2021-06-28 DIAGNOSIS — E785 Hyperlipidemia, unspecified: Secondary | ICD-10-CM | POA: Diagnosis not present

## 2021-06-28 DIAGNOSIS — M8580 Other specified disorders of bone density and structure, unspecified site: Secondary | ICD-10-CM | POA: Diagnosis not present

## 2021-06-28 DIAGNOSIS — E7800 Pure hypercholesterolemia, unspecified: Secondary | ICD-10-CM | POA: Diagnosis not present

## 2021-06-28 DIAGNOSIS — K219 Gastro-esophageal reflux disease without esophagitis: Secondary | ICD-10-CM | POA: Diagnosis not present

## 2021-06-28 DIAGNOSIS — J45909 Unspecified asthma, uncomplicated: Secondary | ICD-10-CM | POA: Diagnosis not present

## 2021-06-28 DIAGNOSIS — E039 Hypothyroidism, unspecified: Secondary | ICD-10-CM | POA: Diagnosis not present

## 2021-06-28 DIAGNOSIS — I10 Essential (primary) hypertension: Secondary | ICD-10-CM | POA: Diagnosis not present

## 2021-06-28 DIAGNOSIS — E1169 Type 2 diabetes mellitus with other specified complication: Secondary | ICD-10-CM | POA: Diagnosis not present

## 2021-07-10 DIAGNOSIS — M79631 Pain in right forearm: Secondary | ICD-10-CM | POA: Diagnosis not present

## 2021-07-20 DIAGNOSIS — S52121A Displaced fracture of head of right radius, initial encounter for closed fracture: Secondary | ICD-10-CM | POA: Diagnosis not present

## 2021-07-20 DIAGNOSIS — M25521 Pain in right elbow: Secondary | ICD-10-CM | POA: Diagnosis not present

## 2021-07-20 DIAGNOSIS — S52041A Displaced fracture of coronoid process of right ulna, initial encounter for closed fracture: Secondary | ICD-10-CM | POA: Diagnosis not present

## 2021-07-20 DIAGNOSIS — M79631 Pain in right forearm: Secondary | ICD-10-CM | POA: Diagnosis not present

## 2021-07-30 DIAGNOSIS — L03116 Cellulitis of left lower limb: Secondary | ICD-10-CM | POA: Diagnosis not present

## 2021-07-30 DIAGNOSIS — B372 Candidiasis of skin and nail: Secondary | ICD-10-CM | POA: Diagnosis not present

## 2021-08-04 ENCOUNTER — Other Ambulatory Visit: Payer: Self-pay | Admitting: Neurology

## 2021-08-06 DIAGNOSIS — L304 Erythema intertrigo: Secondary | ICD-10-CM | POA: Diagnosis not present

## 2021-08-06 DIAGNOSIS — L98499 Non-pressure chronic ulcer of skin of other sites with unspecified severity: Secondary | ICD-10-CM | POA: Diagnosis not present

## 2021-08-10 DIAGNOSIS — S52121A Displaced fracture of head of right radius, initial encounter for closed fracture: Secondary | ICD-10-CM | POA: Diagnosis not present

## 2021-08-10 DIAGNOSIS — S52041A Displaced fracture of coronoid process of right ulna, initial encounter for closed fracture: Secondary | ICD-10-CM | POA: Diagnosis not present

## 2021-08-22 ENCOUNTER — Other Ambulatory Visit: Payer: Self-pay | Admitting: Neurology

## 2021-09-07 DIAGNOSIS — S52121A Displaced fracture of head of right radius, initial encounter for closed fracture: Secondary | ICD-10-CM | POA: Diagnosis not present

## 2021-09-07 DIAGNOSIS — G35 Multiple sclerosis: Secondary | ICD-10-CM | POA: Diagnosis not present

## 2021-09-07 DIAGNOSIS — M79631 Pain in right forearm: Secondary | ICD-10-CM | POA: Diagnosis not present

## 2021-09-07 DIAGNOSIS — M25511 Pain in right shoulder: Secondary | ICD-10-CM | POA: Diagnosis not present

## 2021-09-07 DIAGNOSIS — S52041A Displaced fracture of coronoid process of right ulna, initial encounter for closed fracture: Secondary | ICD-10-CM | POA: Diagnosis not present

## 2021-09-16 DIAGNOSIS — J45909 Unspecified asthma, uncomplicated: Secondary | ICD-10-CM | POA: Diagnosis not present

## 2021-09-16 DIAGNOSIS — M8580 Other specified disorders of bone density and structure, unspecified site: Secondary | ICD-10-CM | POA: Diagnosis not present

## 2021-09-16 DIAGNOSIS — E039 Hypothyroidism, unspecified: Secondary | ICD-10-CM | POA: Diagnosis not present

## 2021-09-16 DIAGNOSIS — E1169 Type 2 diabetes mellitus with other specified complication: Secondary | ICD-10-CM | POA: Diagnosis not present

## 2021-09-16 DIAGNOSIS — E7800 Pure hypercholesterolemia, unspecified: Secondary | ICD-10-CM | POA: Diagnosis not present

## 2021-09-16 DIAGNOSIS — I10 Essential (primary) hypertension: Secondary | ICD-10-CM | POA: Diagnosis not present

## 2021-09-16 DIAGNOSIS — E785 Hyperlipidemia, unspecified: Secondary | ICD-10-CM | POA: Diagnosis not present

## 2021-09-16 DIAGNOSIS — K219 Gastro-esophageal reflux disease without esophagitis: Secondary | ICD-10-CM | POA: Diagnosis not present

## 2021-10-02 DIAGNOSIS — R051 Acute cough: Secondary | ICD-10-CM | POA: Diagnosis not present

## 2021-10-14 DIAGNOSIS — U071 COVID-19: Secondary | ICD-10-CM | POA: Diagnosis not present

## 2021-10-20 DIAGNOSIS — E039 Hypothyroidism, unspecified: Secondary | ICD-10-CM | POA: Diagnosis not present

## 2021-10-20 DIAGNOSIS — G2581 Restless legs syndrome: Secondary | ICD-10-CM | POA: Diagnosis not present

## 2021-10-20 DIAGNOSIS — E1169 Type 2 diabetes mellitus with other specified complication: Secondary | ICD-10-CM | POA: Diagnosis not present

## 2021-10-20 DIAGNOSIS — I10 Essential (primary) hypertension: Secondary | ICD-10-CM | POA: Diagnosis not present

## 2021-10-20 DIAGNOSIS — Z0000 Encounter for general adult medical examination without abnormal findings: Secondary | ICD-10-CM | POA: Diagnosis not present

## 2021-10-20 DIAGNOSIS — G35 Multiple sclerosis: Secondary | ICD-10-CM | POA: Diagnosis not present

## 2021-10-20 DIAGNOSIS — R2689 Other abnormalities of gait and mobility: Secondary | ICD-10-CM | POA: Diagnosis not present

## 2021-10-20 DIAGNOSIS — E7800 Pure hypercholesterolemia, unspecified: Secondary | ICD-10-CM | POA: Diagnosis not present

## 2021-10-28 DIAGNOSIS — S52041A Displaced fracture of coronoid process of right ulna, initial encounter for closed fracture: Secondary | ICD-10-CM | POA: Diagnosis not present

## 2021-10-28 DIAGNOSIS — M6281 Muscle weakness (generalized): Secondary | ICD-10-CM | POA: Diagnosis not present

## 2021-10-28 DIAGNOSIS — S52121A Displaced fracture of head of right radius, initial encounter for closed fracture: Secondary | ICD-10-CM | POA: Diagnosis not present

## 2021-11-06 ENCOUNTER — Other Ambulatory Visit: Payer: Self-pay | Admitting: Neurology

## 2021-11-10 DIAGNOSIS — M6281 Muscle weakness (generalized): Secondary | ICD-10-CM | POA: Diagnosis not present

## 2021-11-10 DIAGNOSIS — M25511 Pain in right shoulder: Secondary | ICD-10-CM | POA: Diagnosis not present

## 2021-11-10 DIAGNOSIS — G35 Multiple sclerosis: Secondary | ICD-10-CM | POA: Diagnosis not present

## 2021-11-10 DIAGNOSIS — M170 Bilateral primary osteoarthritis of knee: Secondary | ICD-10-CM | POA: Diagnosis not present

## 2021-11-12 DIAGNOSIS — G35 Multiple sclerosis: Secondary | ICD-10-CM | POA: Diagnosis not present

## 2021-11-12 DIAGNOSIS — M6281 Muscle weakness (generalized): Secondary | ICD-10-CM | POA: Diagnosis not present

## 2021-11-12 DIAGNOSIS — M25511 Pain in right shoulder: Secondary | ICD-10-CM | POA: Diagnosis not present

## 2021-11-12 DIAGNOSIS — M170 Bilateral primary osteoarthritis of knee: Secondary | ICD-10-CM | POA: Diagnosis not present

## 2021-11-17 DIAGNOSIS — G35 Multiple sclerosis: Secondary | ICD-10-CM | POA: Diagnosis not present

## 2021-11-17 DIAGNOSIS — M25511 Pain in right shoulder: Secondary | ICD-10-CM | POA: Diagnosis not present

## 2021-11-17 DIAGNOSIS — M6281 Muscle weakness (generalized): Secondary | ICD-10-CM | POA: Diagnosis not present

## 2021-11-17 DIAGNOSIS — M170 Bilateral primary osteoarthritis of knee: Secondary | ICD-10-CM | POA: Diagnosis not present

## 2021-11-19 DIAGNOSIS — G35 Multiple sclerosis: Secondary | ICD-10-CM | POA: Diagnosis not present

## 2021-11-19 DIAGNOSIS — M6281 Muscle weakness (generalized): Secondary | ICD-10-CM | POA: Diagnosis not present

## 2021-11-19 DIAGNOSIS — M25511 Pain in right shoulder: Secondary | ICD-10-CM | POA: Diagnosis not present

## 2021-11-19 DIAGNOSIS — M170 Bilateral primary osteoarthritis of knee: Secondary | ICD-10-CM | POA: Diagnosis not present

## 2021-11-26 DIAGNOSIS — M25511 Pain in right shoulder: Secondary | ICD-10-CM | POA: Diagnosis not present

## 2021-11-26 DIAGNOSIS — G35 Multiple sclerosis: Secondary | ICD-10-CM | POA: Diagnosis not present

## 2021-11-26 DIAGNOSIS — M170 Bilateral primary osteoarthritis of knee: Secondary | ICD-10-CM | POA: Diagnosis not present

## 2021-11-26 DIAGNOSIS — M6281 Muscle weakness (generalized): Secondary | ICD-10-CM | POA: Diagnosis not present

## 2021-12-01 DIAGNOSIS — G35 Multiple sclerosis: Secondary | ICD-10-CM | POA: Diagnosis not present

## 2021-12-01 DIAGNOSIS — M6281 Muscle weakness (generalized): Secondary | ICD-10-CM | POA: Diagnosis not present

## 2021-12-01 DIAGNOSIS — M170 Bilateral primary osteoarthritis of knee: Secondary | ICD-10-CM | POA: Diagnosis not present

## 2021-12-01 DIAGNOSIS — M25511 Pain in right shoulder: Secondary | ICD-10-CM | POA: Diagnosis not present

## 2021-12-03 DIAGNOSIS — M6281 Muscle weakness (generalized): Secondary | ICD-10-CM | POA: Diagnosis not present

## 2021-12-03 DIAGNOSIS — M170 Bilateral primary osteoarthritis of knee: Secondary | ICD-10-CM | POA: Diagnosis not present

## 2021-12-03 DIAGNOSIS — G35 Multiple sclerosis: Secondary | ICD-10-CM | POA: Diagnosis not present

## 2021-12-03 DIAGNOSIS — E1169 Type 2 diabetes mellitus with other specified complication: Secondary | ICD-10-CM | POA: Diagnosis not present

## 2021-12-03 DIAGNOSIS — K219 Gastro-esophageal reflux disease without esophagitis: Secondary | ICD-10-CM | POA: Diagnosis not present

## 2021-12-03 DIAGNOSIS — E039 Hypothyroidism, unspecified: Secondary | ICD-10-CM | POA: Diagnosis not present

## 2021-12-03 DIAGNOSIS — M25511 Pain in right shoulder: Secondary | ICD-10-CM | POA: Diagnosis not present

## 2021-12-03 DIAGNOSIS — E7800 Pure hypercholesterolemia, unspecified: Secondary | ICD-10-CM | POA: Diagnosis not present

## 2021-12-03 DIAGNOSIS — I10 Essential (primary) hypertension: Secondary | ICD-10-CM | POA: Diagnosis not present

## 2021-12-11 DIAGNOSIS — H25013 Cortical age-related cataract, bilateral: Secondary | ICD-10-CM | POA: Diagnosis not present

## 2021-12-11 DIAGNOSIS — H2513 Age-related nuclear cataract, bilateral: Secondary | ICD-10-CM | POA: Diagnosis not present

## 2021-12-11 DIAGNOSIS — E119 Type 2 diabetes mellitus without complications: Secondary | ICD-10-CM | POA: Diagnosis not present

## 2021-12-11 DIAGNOSIS — H524 Presbyopia: Secondary | ICD-10-CM | POA: Diagnosis not present

## 2021-12-16 ENCOUNTER — Encounter: Payer: Self-pay | Admitting: Neurology

## 2021-12-16 ENCOUNTER — Ambulatory Visit: Payer: Medicare Other | Admitting: Neurology

## 2021-12-16 VITALS — BP 154/78 | HR 94 | Ht <= 58 in | Wt 227.0 lb

## 2021-12-16 DIAGNOSIS — M25551 Pain in right hip: Secondary | ICD-10-CM | POA: Diagnosis not present

## 2021-12-16 DIAGNOSIS — M549 Dorsalgia, unspecified: Secondary | ICD-10-CM

## 2021-12-16 DIAGNOSIS — M5136 Other intervertebral disc degeneration, lumbar region: Secondary | ICD-10-CM | POA: Diagnosis not present

## 2021-12-16 DIAGNOSIS — M25552 Pain in left hip: Secondary | ICD-10-CM

## 2021-12-16 MED ORDER — TRAMADOL HCL 50 MG PO TABS: 50.0000 mg | ORAL_TABLET | Freq: Three times a day (TID) | ORAL | 5 refills | Status: DC | PRN

## 2021-12-16 NOTE — Progress Notes (Signed)
[facility]  PATIENT: Jocelyn Wilson DOB: 03-04-1951  REFERRING DOCTOR OR PCP:  Adaku Nnodi SOURCE: Patient  _________________________________   HISTORICAL  CHIEF COMPLAINT:  Chief Complaint  Patient presents with   Follow-up    Rm 2, w husband. Here for 6 month MS f/u, off DMT for MS. Pt continues to have mobility issues. Using a rolling walker. Slightly more noticeable hand tremors.     HISTORY OF PRESENT ILLNESS:  Jocelyn Wilson is a 71 y.o. woman with multiple sclerosis, back pain (facet syndrome) s/p fusion, and migraines.  Update 12/16/2021: She feels her mobility is poor and she notes pain when she walks..    If she stands up > 2 minutes weven without walking, she has pain.    She has hip pain on the right.    She takes tramadol 1-2 most days with some benefit.        She has seen Dr. Annette Stable and Dr. Gean Quint at [facility].   She reports LBP did not improve much after surgery.   She is seeing [facility]  (Dr. Davy Pique - sounds lie MBB).  RFA has been discussed.   She noted a mild improvement only .  The surgery was Left L3L4 anterior lateral retroperitoneal interbody decompression. (Dr. Annette Stable).    More recently Dr. Annette Stable did x-rays with flexion and extension and she was told the fusion looked good.   She takes tramadol rarely, just about once or twice a week as it makes her sleepy.      She is doing PT.    Gluteus massage has helped.   She is on tramadol and just takes prn with benefit.   She takes only a few a month.   She has vertigo more translational than rotatory.   She gets car sick at times.     She has MS and has been fairly stable off of Betaseron (since 2019).   She was on Betaseron  > 10 years.   She had right ON in the past and currently has no sequelae (had minimal).   She has a low plaque burden and has had no new lesions x years.     She notes lightheadedness upon standing.     She has depression and anxiety.   She is on  BuSpar, and Zoloft.  She has right knee arthritis and is seeing Ortho (Dr. Veverly Fells) next week.   PT sometimes makes it flare up   MS History:   Her MS was diagnosed around 2004.    She was actually seeing me for migraine headache when she presented with optic neuritis and vertigo and had another MRI.   She had interval development of multiple new foci and underwent a lumbar puncture.  CSF was also consistent with MS.   She was placed on Betaseron and continues on that medication with good control of the MS. No definite exacerbations.  Due to long-term debility, duration of her MS and her age, the decision was made to stop the Betaseron in 2019.  She has not had any exacerbations.  IMAGING 11/03/2018 MRI of the lumbar spine shows that there has been significant changes at L3-L4 noted on the current MRI that have progressed compared to the 2017 MRI.  Specifically, she now has spinal stenosis at L3-L4 with an AP diameter of 8.2 mm.  That diameter was 10.8 mm in 2017.  Additionally, there is about 2 mm of anterolisthesis likely due to the facet hypertrophy  that has progressed.  I believe it is playing some role in her pain.     11/03/2018 MRI of the brain shows that the MS is mild and stable.  She has a 12-millimeter extra-axial mass in the olfactory groove on the right that was not present on MRI 11/09/2017.  It is hyperintense on T2 and FLAIR images and hypointense on T1-weighted images.  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  I am concerned about the rapid growth from not being visible to 12 mm in just 1 year.  I recommend that we repeat the MRI.   She has had an allergic reaction to contrast in the past so we would do the study without.  11/13/2018  Lumbar flex/ext  IMPRESSION: Mild dynamic instability at the L3-4 level, up to 5 mm anterolisthesis with patient standing in flexion. This is facet mediated, based on plain film appearance as well as review of MR.  Severe degenerative disease  L4-5, near complete loss of interspace Height.  05/15/2019 MRI of the brain shows stable MS.  The extra-axial mass in the right olfactory groove (the study is also noncontrasted due to patient's intolerance of contrast in the past) actually appears a little smaller than on the earlier MRI.  04/15/2015 MRI of the cervical spie showed no MS lesions and did have DJD.      REVIEW OF SYSTEMS: Constitutional: No fevers, chills, sweats, or change in appetite Eyes: No visual changes, double vision, eye pain Ear, nose and throat: No hearing loss, ear pain, nasal congestion, sore throat Cardiovascular: No chest pain, palpitations Respiratory:  No shortness of breath at rest or with exertion.   No wheezes GastrointestinaI: No nausea, vomiting, diarrhea, abdominal pain, fecal incontinence Genitourinary:  No dysuria, urinary retention or frequency.  No nocturia. Musculoskeletal:  No neck pain, back pain Integumentary: No rash, pruritus, skin lesions Neurological: as above Psychiatric: No depression at this time.  No anxiety Endocrine: No palpitations, diaphoresis, change in appetite, change in weigh or increased thirst Hematologic/Lymphatic:  No anemia, purpura, petechiae. Allergic/Immunologic: No itchy/runny eyes, nasal congestion, recent allergic reactions, rashes  ALLERGIES: Allergies  Allergen Reactions   Iron Anaphylaxis    Iv iron   Ciprofloxacin Nausea Only   Contrast Media [Iodinated Contrast Media] Itching and Rash   Latex Rash   Other Other (See Comments)    Raw apples. Cucumbers, hazelnuts, Raw vegetables, Raw fruits, except citrus     Sulfa Antibiotics Itching and Rash   Tape Rash    Adhesive tape    HOME MEDICATIONS:  Current Outpatient Medications:    atorvastatin (LIPITOR) 20 MG tablet, Take 20 mg by mouth at bedtime. , Disp: , Rfl:    busPIRone (BUSPAR) 30 MG tablet, Take 1 tablet (30 mg total) by mouth 3 (three) times daily., Disp: 270 tablet, Rfl: 1   Cyanocobalamin  (VITAMIN B12) 1000 MCG TBCR, Take 1,000 mcg by mouth daily. , Disp: , Rfl:    esomeprazole (NEXIUM) 40 MG capsule, TAKE 1 CAPSULE BY MOUTH  TWICE DAILY, Disp: 180 capsule, Rfl: 3   ferrous sulfate 325 (65 FE) MG tablet, Take 325 mg by mouth daily. , Disp: , Rfl:    furosemide (LASIX) 20 MG tablet, Take 20 mg by mouth daily., Disp: , Rfl:    Ibuprofen 200 MG CAPS, Take 400 mg by mouth 3 (three) times daily as needed (headache or migraine). , Disp: , Rfl:    Insulin Pen Needle (BD PEN NEEDLE NANO U/F) 32G X  4 MM MISC, 1 Package by Does not apply route 2 (two) times daily., Disp: 100 each, Rfl: 0   levothyroxine (SYNTHROID) 137 MCG tablet, Take 137 mcg by mouth daily before breakfast. , Disp: , Rfl:    liraglutide (VICTOZA) 18 MG/3ML SOPN, ADMINISTER 0.6 MG UNDER THE SKIN EVERY MORNING (Patient taking differently: Inject 0.6 mg into the skin daily with breakfast.), Disp: 3 mL, Rfl: 0   meclizine (ANTIVERT) 25 MG tablet, Take 1 tablet (25 mg total) by mouth 3 (three) times daily as needed for dizziness., Disp: 270 tablet, Rfl: 1   metFORMIN (GLUCOPHAGE-XR) 500 MG 24 hr tablet, Take 1,000 mg by mouth 2 (two) times daily., Disp: , Rfl:    metoprolol succinate (TOPROL-XL) 50 MG 24 hr tablet, TAKE 1 TABLET BY MOUTH  DAILY (Patient taking differently: Take 50 mg by mouth daily.), Disp: 90 tablet, Rfl: 3   ONE TOUCH ULTRA TEST test strip, USE TO CHECK FASTING AND PRE-DINNER BLOOD GLUCOSE BID, Disp: , Rfl: 12   ONETOUCH DELICA LANCETS 43X MISC, USE TO CHECK FASTING AND PRE-DINNER BLOOD GLUCOSE BID, Disp: , Rfl: 12   potassium citrate (UROCIT-K) 10 MEQ (1080 MG) SR tablet, Take 20 mEq by mouth 3 (three) times daily with meals. , Disp: , Rfl:    rOPINIRole (REQUIP) 1 MG tablet, TAKE 1 TABLET BY MOUTH 4  TIMES DAILY, Disp: 360 tablet, Rfl: 1   sertraline (ZOLOFT) 100 MG tablet, Take 2 tablets (200 mg total) by mouth daily., Disp: 180 tablet, Rfl: 3   traMADol (ULTRAM) 50 MG tablet, Take 1 tablet (50 mg total) by  mouth 3 (three) times daily as needed., Disp: 90 tablet, Rfl: 5   traZODone (DESYREL) 150 MG tablet, Take 50 mg by mouth at bedtime., Disp: , Rfl:    VITAMIN D PO, Take 5,000 Units by mouth daily. , Disp: , Rfl:   PAST MEDICAL HISTORY: Past Medical History:  Diagnosis Date   Allergy    Anxiety    Arthritis    Asthma    does not use an inhaer- patient does not think she does   Back pain    Chronic kidney disease    KIDNEY STONES WITH STENTS   Depression    Diabetes ([place])    type II   Diverticula of colon    Dyspnea    if climbing steps   Eczema    Food allergy    Gallbladder problem    GERD (gastroesophageal reflux disease)    History of kidney stones    Hypercholesterolemia    Hyperlipidemia    Hypothyroidism    Joint pain    Kidney stones    Leg edema    Migraine headache    with pressure change   Multiple sclerosis (HCC)    Obesity    Palpitations    PONV (postoperative nausea and vomiting)    with breast reduction surgery in [place] from the ~ 1980's/1990's   Restless leg syndrome    Right sided sciatica 10/29/2015   Snoring    Thyroid disease    hyperthyroidism   Unsteady gait    Vertigo    Vision abnormalities     PAST SURGICAL HISTORY: Past Surgical History:  Procedure Laterality Date   ANTERIOR LATERAL LUMBAR FUSION WITH PERCUTANEOUS SCREW 1 LEVEL Left 02/22/2020   Procedure: Anterior Lateral Lumbar Fusion - Lumbar Three-Lumbar Four with percutaneous pedicle screws;  Surgeon: Earnie Larsson, MD;  Location: [facility];  Service: Neurosurgery;  Laterality: Left;  Anterior Lateral Lumbar Fusion - Lumbar Three-Lumbar Four with percutaneous pedicle screws   BREAST SURGERY     AUGMENTATION   carpel tunnel Bilateral    CHOLECYSTECTOMY     DILATION AND CURETTAGE OF UTERUS  1983   ESOPHAGOGASTRODUODENOSCOPY     FINGER SURGERY  x8    trigger finger all except left pinky- right pinky didnt work   LITHOTRIPSY     NISSEN FUNDOPLICATION     VENTRAL HERNIA REPAIR  07/25/2012    Procedure: LAPAROSCOPIC VENTRAL HERNIA;  Surgeon: Adin Hector, MD;  Location: [facility];  Service: General;  Laterality: N/A;    FAMILY HISTORY: Family History  Problem Relation Age of Onset   Hyperlipidemia Mother    Glaucoma Mother    Hypertension Mother    Anxiety disorder Mother    Depression Mother    Heart disease Father    Cancer Father        prostate   Diabetes Father    Dementia Father    Hypertension Father    Hyperlipidemia Father    Sleep apnea Father    Obesity Father    Cancer Brother        esophageal   Arthritis Sister        RA    SOCIAL HISTORY:  Social History   Socioeconomic History   Marital status: Married    Spouse name: Amorina Doerr   Number of children: 1   Years of education: Not on file   Highest education level: Not on file  Occupational History   Occupation: retired  Tobacco Use   Smoking status: Former    Types: Cigarettes    Quit date: 1978    Years since quitting: 45.2   Smokeless tobacco: Never  Vaping Use   Vaping Use: Never used  Substance and Sexual Activity   Alcohol use: No   Drug use: No   Sexual activity: Not on file  Other Topics Concern   Not on file  Social History Narrative   Not on file   Social Determinants of Health   Financial Resource Strain: Not on file  Food Insecurity: Not on file  Transportation Needs: Not on file  Physical Activity: Not on file  Stress: Not on file  Social Connections: Not on file  Intimate Partner Violence: Not on file     PHYSICAL EXAM  Vitals:   12/16/21 1126  BP: (!) 154/78  Pulse: 94  Weight: 227 lb (103 kg)  Height: '4\' 9"'$  (1.448 m)    Body mass index is 49.12 kg/m.   General: The patient is well-developed and well-nourished and in no acute distress  Musculoskeletal:  Lower lumbar paraspinals are mildly tender.  Mildly tender over right trochanteric bursa and mild over piriformis muscles.   She has reduced range of motion in the back.  Neurologic  Exam  Mental status: The patient is alert and oriented x 3 at the time of the examination. The patient has apparent normal recent and remote memory, with an apparently normal attention span and concentration ability.   Speech is normal.  Cranial nerves: Extraocular movements are full.  Facial strength and sensation is normal.  Trapezius strength is normal.  Hearing is symmetric.  Weber does not lateralize.  Motor:  Muscle bulk is normal.   Tone is normal. Strength is  5 / 5 in all 4 extremities.   Sensory: Sensory testing is intact to soft touch.     Gait and station: Station is  normal.  Gait is arthritic and wide.  She can walk without her walker.  She is unable to do tandem walk.    Romberg negative  Reflexes: Deep tendon reflexes are symmetric and normal bilaterally.     ______________________________________________  Lumbar degenerative disc disease - Plan: Ambulatory referral to Physical Therapy, CANCELED: Ambulatory referral to Physical Therapy  Back pain, unspecified back location, unspecified back pain laterality, unspecified chronicity - Plan: Ambulatory referral to Physical Therapy, CANCELED: Ambulatory referral to Physical Therapy  Hip pain, bilateral - Plan: Ambulatory referral to Physical Therapy, CANCELED: Ambulatory referral to Physical Therapy   1.   We discussed changing her tramadol to 1 pill 3 times a day on a regular scheduled basis as that may help to reduce her pain better, rather than waiting till her pain becomes severe.    F/u with [facility].  2.   Her MS is stable.  Her gait issues are due to pain..   She is off of Betaseron and she will continue off of a disease modifying therapy for now.   3.  stay active and exercise as tolerated. 4.   Referral to physical therapy.  She received some benefit from dry needling in the past.  Return in 6 months or sooner if there are new or worsening neurologic symptoms.   Tiburcio Linder A. Felecia Shelling, MD, PhD 05/13/3824, 0:53  PM Certified in Neurology, Clinical Neurophysiology, Sleep Medicine, Pain Medicine and Neuroimaging  [facility] [address] [place], [place] [address] [phone]  ;,

## 2021-12-17 ENCOUNTER — Telehealth: Payer: Self-pay | Admitting: Neurology

## 2021-12-17 NOTE — Telephone Encounter (Signed)
Sent to [facility] ph # [phone].  ?

## 2022-01-05 DIAGNOSIS — M5451 Vertebrogenic low back pain: Secondary | ICD-10-CM | POA: Diagnosis not present

## 2022-01-08 DIAGNOSIS — E7800 Pure hypercholesterolemia, unspecified: Secondary | ICD-10-CM | POA: Diagnosis not present

## 2022-01-08 DIAGNOSIS — E039 Hypothyroidism, unspecified: Secondary | ICD-10-CM | POA: Diagnosis not present

## 2022-01-11 DIAGNOSIS — M5451 Vertebrogenic low back pain: Secondary | ICD-10-CM | POA: Diagnosis not present

## 2022-01-14 ENCOUNTER — Other Ambulatory Visit: Payer: Self-pay | Admitting: Neurology

## 2022-01-15 DIAGNOSIS — M5451 Vertebrogenic low back pain: Secondary | ICD-10-CM | POA: Diagnosis not present

## 2022-01-18 DIAGNOSIS — S52041A Displaced fracture of coronoid process of right ulna, initial encounter for closed fracture: Secondary | ICD-10-CM | POA: Diagnosis not present

## 2022-01-18 DIAGNOSIS — S52121A Displaced fracture of head of right radius, initial encounter for closed fracture: Secondary | ICD-10-CM | POA: Diagnosis not present

## 2022-01-19 DIAGNOSIS — M5451 Vertebrogenic low back pain: Secondary | ICD-10-CM | POA: Diagnosis not present

## 2022-01-21 DIAGNOSIS — M5451 Vertebrogenic low back pain: Secondary | ICD-10-CM | POA: Diagnosis not present

## 2022-01-27 DIAGNOSIS — L89329 Pressure ulcer of left buttock, unspecified stage: Secondary | ICD-10-CM | POA: Diagnosis not present

## 2022-01-27 DIAGNOSIS — L900 Lichen sclerosus et atrophicus: Secondary | ICD-10-CM | POA: Diagnosis not present

## 2022-01-27 DIAGNOSIS — J988 Other specified respiratory disorders: Secondary | ICD-10-CM | POA: Diagnosis not present

## 2022-01-27 DIAGNOSIS — L89319 Pressure ulcer of right buttock, unspecified stage: Secondary | ICD-10-CM | POA: Diagnosis not present

## 2022-02-02 DIAGNOSIS — M5451 Vertebrogenic low back pain: Secondary | ICD-10-CM | POA: Diagnosis not present

## 2022-02-04 DIAGNOSIS — M5451 Vertebrogenic low back pain: Secondary | ICD-10-CM | POA: Diagnosis not present

## 2022-02-09 DIAGNOSIS — M5451 Vertebrogenic low back pain: Secondary | ICD-10-CM | POA: Diagnosis not present

## 2022-02-10 DIAGNOSIS — Z1231 Encounter for screening mammogram for malignant neoplasm of breast: Secondary | ICD-10-CM | POA: Diagnosis not present

## 2022-02-11 DIAGNOSIS — M5451 Vertebrogenic low back pain: Secondary | ICD-10-CM | POA: Diagnosis not present

## 2022-02-15 DIAGNOSIS — R921 Mammographic calcification found on diagnostic imaging of breast: Secondary | ICD-10-CM | POA: Diagnosis not present

## 2022-02-15 DIAGNOSIS — R922 Inconclusive mammogram: Secondary | ICD-10-CM | POA: Diagnosis not present

## 2022-02-15 DIAGNOSIS — R928 Other abnormal and inconclusive findings on diagnostic imaging of breast: Secondary | ICD-10-CM | POA: Diagnosis not present

## 2022-03-02 DIAGNOSIS — M13862 Other specified arthritis, left knee: Secondary | ICD-10-CM | POA: Diagnosis not present

## 2022-03-02 DIAGNOSIS — M13861 Other specified arthritis, right knee: Secondary | ICD-10-CM | POA: Diagnosis not present

## 2022-03-04 ENCOUNTER — Other Ambulatory Visit: Payer: Self-pay | Admitting: Neurology

## 2022-03-04 ENCOUNTER — Telehealth: Payer: Self-pay | Admitting: Neurology

## 2022-03-04 MED ORDER — TRAZODONE HCL 50 MG PO TABS
50.0000 mg | ORAL_TABLET | Freq: Every day | ORAL | 1 refills | Status: DC
Start: 2022-03-04 — End: 2022-09-06

## 2022-03-04 NOTE — Telephone Encounter (Signed)
Called the patient to confirm what dose she is actually taking of the medication. She states her and Dr Felecia Shelling agreed that she should take 50 mg tablet. She has for a long time just been cutting tablets in half to equal that dose. I have confirmed that the correct dose is 50 mg at bedtime and will correct the script to reflect this. Refills will be sent for the pt.

## 2022-03-04 NOTE — Telephone Encounter (Signed)
Pt request refill for traZODone (DESYREL) 150 MG tablet at [facility] #[id number]

## 2022-03-16 DIAGNOSIS — L89329 Pressure ulcer of left buttock, unspecified stage: Secondary | ICD-10-CM | POA: Diagnosis not present

## 2022-03-16 DIAGNOSIS — L89319 Pressure ulcer of right buttock, unspecified stage: Secondary | ICD-10-CM | POA: Diagnosis not present

## 2022-03-16 DIAGNOSIS — L900 Lichen sclerosus et atrophicus: Secondary | ICD-10-CM | POA: Diagnosis not present

## 2022-03-16 DIAGNOSIS — L821 Other seborrheic keratosis: Secondary | ICD-10-CM | POA: Diagnosis not present

## 2022-03-16 DIAGNOSIS — Z713 Dietary counseling and surveillance: Secondary | ICD-10-CM | POA: Diagnosis not present

## 2022-04-27 DIAGNOSIS — E7800 Pure hypercholesterolemia, unspecified: Secondary | ICD-10-CM | POA: Diagnosis not present

## 2022-04-27 DIAGNOSIS — J45909 Unspecified asthma, uncomplicated: Secondary | ICD-10-CM | POA: Diagnosis not present

## 2022-04-27 DIAGNOSIS — E1169 Type 2 diabetes mellitus with other specified complication: Secondary | ICD-10-CM | POA: Diagnosis not present

## 2022-04-27 DIAGNOSIS — K219 Gastro-esophageal reflux disease without esophagitis: Secondary | ICD-10-CM | POA: Diagnosis not present

## 2022-04-27 DIAGNOSIS — I872 Venous insufficiency (chronic) (peripheral): Secondary | ICD-10-CM | POA: Diagnosis not present

## 2022-04-27 DIAGNOSIS — E785 Hyperlipidemia, unspecified: Secondary | ICD-10-CM | POA: Diagnosis not present

## 2022-04-27 DIAGNOSIS — E039 Hypothyroidism, unspecified: Secondary | ICD-10-CM | POA: Diagnosis not present

## 2022-04-27 DIAGNOSIS — I10 Essential (primary) hypertension: Secondary | ICD-10-CM | POA: Diagnosis not present

## 2022-04-27 DIAGNOSIS — G35 Multiple sclerosis: Secondary | ICD-10-CM | POA: Diagnosis not present

## 2022-04-27 DIAGNOSIS — M8580 Other specified disorders of bone density and structure, unspecified site: Secondary | ICD-10-CM | POA: Diagnosis not present

## 2022-05-04 ENCOUNTER — Telehealth: Payer: Self-pay | Admitting: Neurology

## 2022-05-04 NOTE — Telephone Encounter (Signed)
Pt is requesting a sooner appointment. Pt said she is having dizziness everyday and she needs to be seen. Pt is requesting a call from the nurse.

## 2022-05-04 NOTE — Telephone Encounter (Signed)
Called pt. Scheduled work in visit with Dr. Felecia Shelling 05/05/22 at 1:30pm. She will check in at 1:00pm.

## 2022-05-05 ENCOUNTER — Ambulatory Visit (INDEPENDENT_AMBULATORY_CARE_PROVIDER_SITE_OTHER): Payer: Medicare Other | Admitting: Neurology

## 2022-05-05 ENCOUNTER — Encounter: Payer: Self-pay | Admitting: Neurology

## 2022-05-05 VITALS — BP 137/67 | HR 84 | Ht <= 58 in | Wt 227.5 lb

## 2022-05-05 DIAGNOSIS — R251 Tremor, unspecified: Secondary | ICD-10-CM

## 2022-05-05 DIAGNOSIS — G35 Multiple sclerosis: Secondary | ICD-10-CM | POA: Diagnosis not present

## 2022-05-05 DIAGNOSIS — R269 Unspecified abnormalities of gait and mobility: Secondary | ICD-10-CM

## 2022-05-05 DIAGNOSIS — R42 Dizziness and giddiness: Secondary | ICD-10-CM

## 2022-05-05 NOTE — Progress Notes (Signed)
[facility]  PATIENT: Jocelyn Wilson DOB: [birth year]  REFERRING DOCTOR OR PCP:  Adaku Nnodi SOURCE: Patient  _________________________________   HISTORICAL  CHIEF COMPLAINT:  Chief Complaint  Patient presents with   Follow-up    Rm 2, w daughter. Here for to f/u for daily dizziness. Pt reports feeling dizzy all day as soon as she wakes up. No falls since last, only near falls. Using a walker at all times.     HISTORY OF PRESENT ILLNESS:  Jocelyn Wilson is a 71 y.o. woman with multiple sclerosis, back pain (facet syndrome) s/p fusion, and migraines.  Update 05/05/2022: She has dizziness - when she wakes up she has some vertigo (she sleeps sitting up).   Dizziness is worse if she makes sudden movements of her head (she often turns whole body rather than just head to avoid).  She notes having dizziness looking up or down.   Meclizine has not helped.  She also has some ligheadedness.    She has not been on a DMT for about 5 years and MRI has not shown any activity.     She has tingling in her left leg    She has surgery by  Dr. Annette Stable and also has had RFA x 2 without benefit for the LBP at [facility].   Years ago, MBB/RFA had helped.   LBP did not improve much after surgery.   She is seeing [facility]  (Dr. Davy Pique).  The surgery was Left L3L4 anterior lateral retroperitoneal interbody decompression. (Dr. Annette Stable).    Dr. Annette Stable did x-rays with flexion and extension and she was told the fusion looked good.     She takes tramadol rarely, just about once or twice a week as it makes her sleepy.    She did PT and gluteus massage has helped.       She has MS and has been fairly stable off of Betaseron (since 2019).   She was on Betaseron  > 10 years.   She had right ON in the past and currently has no sequelae (had minimal).   She has a low plaque burden and has had no new lesions x years.     She has reduced gait (likely combo of MS and LB issues), she  has urge incontinence.   She has depression and anxiety.   She is on BuSpar, and Zoloft (200 mg daily)  MS History:   Her MS was diagnosed around 2004.    She was actually seeing me for migraine headache when she presented with optic neuritis and vertigo and had another MRI.   She had interval development of multiple new foci and underwent a lumbar puncture.  CSF was also consistent with MS.   She was placed on Betaseron and continues on that medication with good control of the MS. No definite exacerbations.  Due to long-term debility, duration of her MS and her age, the decision was made to stop the Betaseron in 2019.  She has not had any exacerbations.  IMAGING 12/27/2020 MRI brain was unchanged compared to 2020.    11/03/2018 MRI of the lumbar spine shows that there has been significant changes at L3-L4 noted on the current MRI that have progressed compared to the 2017 MRI.  Specifically, she now has spinal stenosis at L3-L4 with an AP diameter of 8.2 mm.  That diameter was 10.8 mm in 2017.  Additionally, there is about 2 mm of anterolisthesis likely due to the facet  hypertrophy that has progressed.  I believe it is playing some role in her pain.     11/03/2018 MRI of the brain shows that the MS is mild and stable.  She has a 12-millimeter extra-axial mass in the olfactory groove on the right that was not present on MRI 11/09/2017.  It is hyperintense on T2 and FLAIR images and hypointense on T1-weighted images.  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  I am concerned about the rapid growth from not being visible to 12 mm in just 1 year.  I recommend that we repeat the MRI.   She has had an allergic reaction to contrast in the past so we would do the study without.  11/13/2018  Lumbar flex/ext  IMPRESSION: Mild dynamic instability at the L3-4 level, up to 5 mm anterolisthesis with patient standing in flexion. This is facet mediated, based on plain film appearance as well as review of  MR.  Severe degenerative disease L4-5, near complete loss of interspace Height.  05/15/2019 MRI of the brain shows stable MS.  The extra-axial mass in the right olfactory groove (the study is also noncontrasted due to patient's intolerance of contrast in the past) actually appears a little smaller than on the earlier MRI.  04/15/2015 MRI of the cervical spie showed no MS lesions and did have DJD.      REVIEW OF SYSTEMS: Constitutional: No fevers, chills, sweats, or change in appetite Eyes: No visual changes, double vision, eye pain Ear, nose and throat: No hearing loss, ear pain, nasal congestion, sore throat Cardiovascular: No chest pain, palpitations Respiratory:  No shortness of breath at rest or with exertion.   No wheezes GastrointestinaI: No nausea, vomiting, diarrhea, abdominal pain, fecal incontinence Genitourinary:  No dysuria, urinary retention or frequency.  No nocturia. Musculoskeletal:  No neck pain, back pain Integumentary: No rash, pruritus, skin lesions Neurological: as above Psychiatric: No depression at this time.  No anxiety Endocrine: No palpitations, diaphoresis, change in appetite, change in weigh or increased thirst Hematologic/Lymphatic:  No anemia, purpura, petechiae. Allergic/Immunologic: No itchy/runny eyes, nasal congestion, recent allergic reactions, rashes  ALLERGIES: Allergies  Allergen Reactions   Iron Anaphylaxis    Iv iron   Ciprofloxacin Nausea Only   Contrast Media [Iodinated Contrast Media] Itching and Rash   Latex Rash   Other Other (See Comments)    Raw apples. Cucumbers, hazelnuts, Raw vegetables, Raw fruits, except citrus     Sulfa Antibiotics Itching and Rash   Tape Rash    Adhesive tape    HOME MEDICATIONS:  Current Outpatient Medications:    atorvastatin (LIPITOR) 20 MG tablet, Take 20 mg by mouth at bedtime. , Disp: , Rfl:    busPIRone (BUSPAR) 30 MG tablet, TAKE 1 TABLET BY MOUTH 3 TIMES  DAILY, Disp: 270 tablet, Rfl: 1    Cyanocobalamin (VITAMIN B12) 1000 MCG TBCR, Take 1,000 mcg by mouth daily. , Disp: , Rfl:    esomeprazole (NEXIUM) 40 MG capsule, TAKE 1 CAPSULE BY MOUTH  TWICE DAILY, Disp: 180 capsule, Rfl: 3   ferrous sulfate 325 (65 FE) MG tablet, Take 325 mg by mouth daily. , Disp: , Rfl:    furosemide (LASIX) 20 MG tablet, Take 20 mg by mouth daily., Disp: , Rfl:    Ibuprofen 200 MG CAPS, Take 400 mg by mouth 3 (three) times daily as needed (headache or migraine). , Disp: , Rfl:    Insulin Pen Needle (BD PEN NEEDLE NANO U/F) 32G X 4 MM  MISC, 1 Package by Does not apply route 2 (two) times daily., Disp: 100 each, Rfl: 0   levothyroxine (SYNTHROID) 137 MCG tablet, Take 137 mcg by mouth daily before breakfast. , Disp: , Rfl:    liraglutide (VICTOZA) 18 MG/3ML SOPN, ADMINISTER 0.6 MG UNDER THE SKIN EVERY MORNING (Patient taking differently: Inject 0.6 mg into the skin daily with breakfast.), Disp: 3 mL, Rfl: 0   meclizine (ANTIVERT) 25 MG tablet, TAKE 1 TABLET BY MOUTH 3  TIMES DAILY AS NEEDED FOR  DIZZINESS, Disp: 270 tablet, Rfl: 1   metFORMIN (GLUCOPHAGE-XR) 500 MG 24 hr tablet, Take 1,000 mg by mouth 2 (two) times daily., Disp: , Rfl:    metoprolol succinate (TOPROL-XL) 50 MG 24 hr tablet, TAKE 1 TABLET BY MOUTH  DAILY (Patient taking differently: Take 50 mg by mouth daily.), Disp: 90 tablet, Rfl: 3   ONE TOUCH ULTRA TEST test strip, USE TO CHECK FASTING AND PRE-DINNER BLOOD GLUCOSE BID, Disp: , Rfl: 12   ONETOUCH DELICA LANCETS 71I MISC, USE TO CHECK FASTING AND PRE-DINNER BLOOD GLUCOSE BID, Disp: , Rfl: 12   potassium citrate (UROCIT-K) 10 MEQ (1080 MG) SR tablet, Take 20 mEq by mouth 3 (three) times daily with meals. , Disp: , Rfl:    rOPINIRole (REQUIP) 1 MG tablet, TAKE 1 TABLET BY MOUTH 4 TIMES  DAILY, Disp: 360 tablet, Rfl: 3   sertraline (ZOLOFT) 100 MG tablet, TAKE 2 TABLETS BY MOUTH  DAILY, Disp: 180 tablet, Rfl: 3   traMADol (ULTRAM) 50 MG tablet, Take 1 tablet (50 mg total) by mouth 3 (three) times  daily as needed., Disp: 90 tablet, Rfl: 5   traZODone (DESYREL) 50 MG tablet, Take 1 tablet (50 mg total) by mouth at bedtime., Disp: 90 tablet, Rfl: 1   VITAMIN D PO, Take 5,000 Units by mouth daily. , Disp: , Rfl:   PAST MEDICAL HISTORY: Past Medical History:  Diagnosis Date   Allergy    Anxiety    Arthritis    Asthma    does not use an inhaer- patient does not think she does   Back pain    Chronic kidney disease    KIDNEY STONES WITH STENTS   Depression    Diabetes ([place])    type II   Diverticula of colon    Dyspnea    if climbing steps   Eczema    Food allergy    Gallbladder problem    GERD (gastroesophageal reflux disease)    History of kidney stones    Hypercholesterolemia    Hyperlipidemia    Hypothyroidism    Joint pain    Kidney stones    Leg edema    Migraine headache    with pressure change   Multiple sclerosis (HCC)    Obesity    Palpitations    PONV (postoperative nausea and vomiting)    with breast reduction surgery in [place] from the ~ 1980's/1990's   Restless leg syndrome    Right sided sciatica 10/29/2015   Snoring    Thyroid disease    hyperthyroidism   Unsteady gait    Vertigo    Vision abnormalities     PAST SURGICAL HISTORY: Past Surgical History:  Procedure Laterality Date   ANTERIOR LATERAL LUMBAR FUSION WITH PERCUTANEOUS SCREW 1 LEVEL Left 02/22/2020   Procedure: Anterior Lateral Lumbar Fusion - Lumbar Three-Lumbar Four with percutaneous pedicle screws;  Surgeon: Earnie Larsson, MD;  Location: [facility];  Service: Neurosurgery;  Laterality: Left;  Anterior  Lateral Lumbar Fusion - Lumbar Three-Lumbar Four with percutaneous pedicle screws   BREAST SURGERY     AUGMENTATION   carpel tunnel Bilateral    CHOLECYSTECTOMY     DILATION AND CURETTAGE OF UTERUS  1983   ESOPHAGOGASTRODUODENOSCOPY     FINGER SURGERY  x8    trigger finger all except left pinky- right pinky didnt work   LITHOTRIPSY     NISSEN FUNDOPLICATION     VENTRAL HERNIA REPAIR   07/25/2012   Procedure: LAPAROSCOPIC VENTRAL HERNIA;  Surgeon: Adin Hector, MD;  Location: [facility];  Service: General;  Laterality: N/A;    FAMILY HISTORY: Family History  Problem Relation Age of Onset   Hyperlipidemia Mother    Glaucoma Mother    Hypertension Mother    Anxiety disorder Mother    Depression Mother    Heart disease Father    Cancer Father        prostate   Diabetes Father    Dementia Father    Hypertension Father    Hyperlipidemia Father    Sleep apnea Father    Obesity Father    Cancer Brother        esophageal   Arthritis Sister        RA    SOCIAL HISTORY:  Social History   Socioeconomic History   Marital status: Married    Spouse name: Abigaile Rossie   Number of children: 1   Years of education: Not on file   Highest education level: Not on file  Occupational History   Occupation: retired  Tobacco Use   Smoking status: Former    Types: Cigarettes    Quit date: 1978    Years since quitting: 45.5   Smokeless tobacco: Never  Vaping Use   Vaping Use: Never used  Substance and Sexual Activity   Alcohol use: No   Drug use: No   Sexual activity: Not on file  Other Topics Concern   Not on file  Social History Narrative   Not on file   Social Determinants of Health   Financial Resource Strain: Not on file  Food Insecurity: Not on file  Transportation Needs: Not on file  Physical Activity: Not on file  Stress: Not on file  Social Connections: Not on file  Intimate Partner Violence: Not on file     PHYSICAL EXAM  Vitals:   05/05/22 1333  BP: 137/67  Pulse: 84  Weight: 227 lb 8 oz (103.2 kg)  Height: '4\' 9"'$  (1.448 m)    Body mass index is 49.23 kg/m.   General: The patient is well-developed and well-nourished and in no acute distress  Musculoskeletal:  Lower lumbar paraspinals are mildly tender.  Mildly tender over right trochanteric bursa and mild over piriformis muscles.   She has reduced range of motion in the  back.  Neurologic Exam  Mental status: The patient is alert and oriented x 3 at the time of the examination. The patient has apparent normal recent and remote memory.  Focus/attention appeared to be a little reduced today.Marland Kitchen   Speech is normal.  Cranial nerves: Extraocular movements are full.  Facial strength and sensation is normal.  Trapezius strength is normal.  Hearing is symmetric.  Weber does not lateralize.  Motor:  Muscle bulk is normal.   Tone is normal. Strength is  5 / 5 in all 4 extremities.   Sensory: Sensory testing is intact to soft touch.     Gait and station: Station is  normal.  Gait is arthritic and wide.  She can walk without her walker but does better with it.  She is unable to do tandem walk.    Romberg negative  Reflexes: Deep tendon reflexes are symmetric and normal bilaterally.     Dix-Hallpike maneuver did not elicit nystagmus or vertigo though she did feel vertigo upon sitting back up ______________________________________________  Multiple sclerosis ([place]) - Plan: MR CERVICAL SPINE WO CONTRAST, MR BRAIN W WO CONTRAST  Functional gait disorder with tremor  Vertigo - Plan: MR BRAIN W WO CONTRAST  Gait disturbance - Plan: MR CERVICAL SPINE WO CONTRAST, MR BRAIN W WO CONTRAST   1.   Because she is noticing more vertigo, we will check an MRI of the brain.  She is off of a disease modifying therapy and we need to make sure that she is not having breakthrough activity.  This will also allow [place] to assess the internal auditory canals.  Vertigo did not appear to be inner ear based on the normal response to position changes 2.   She can take the tramadol on a scheduled basis.      F/u with [facility].  3.  stay active and exercise as tolerated. 4.  Return in 6 months or sooner if there are new or worsening neurologic symptoms.  47-minute office visit with the majority of the time spent face-to-face for history and physical, discussion/counseling and  decision-making.  Additional time with record review and documentation.  Henritta Mutz A. Felecia Shelling, MD, PhD 3/40/3524, 8:18 PM Certified in Neurology, Clinical Neurophysiology, Sleep Medicine, Pain Medicine and Neuroimaging  [facility] [address], [place], [place] [address] [phone]  ;,

## 2022-05-06 ENCOUNTER — Telehealth: Payer: Self-pay | Admitting: Neurology

## 2022-05-06 DIAGNOSIS — G35 Multiple sclerosis: Secondary | ICD-10-CM

## 2022-05-06 DIAGNOSIS — R42 Dizziness and giddiness: Secondary | ICD-10-CM

## 2022-05-06 DIAGNOSIS — R269 Unspecified abnormalities of gait and mobility: Secondary | ICD-10-CM

## 2022-05-06 MED ORDER — OXYBUTYNIN CHLORIDE ER 10 MG PO TB24: 10.0000 mg | ORAL_TABLET | Freq: Every day | ORAL | 5 refills | Status: DC

## 2022-05-06 NOTE — Telephone Encounter (Signed)
[employer] medicare NPR sent to GI 

## 2022-05-06 NOTE — Telephone Encounter (Signed)
Pt asking if Dr. Felecia Shelling could prescribe something for incontinence. Discuss at last office visit.  Would like a call  from the nurse.

## 2022-05-06 NOTE — Telephone Encounter (Addendum)
Spoke with Dr. Felecia Shelling. He recommends ditropan [place] '10mg'$  po qd #30, 5 refills.  I called pt. She is agreeable to this. I e-scribed to [place], [place] per pt request.

## 2022-05-10 NOTE — Telephone Encounter (Signed)
GI called this patient to schedule and she said she is highly allergic to the contrast. Do you want her to get the MRI brain without contrast?

## 2022-05-10 NOTE — Telephone Encounter (Signed)
Can you please put in an order for MRI brain without contrast?

## 2022-05-10 NOTE — Addendum Note (Signed)
Addended by: Wyvonnia Lora on: 05/10/2022 04:18 PM   Modules accepted: Orders

## 2022-05-10 NOTE — Telephone Encounter (Signed)
Sent new orders to GI they will call the patient

## 2022-05-13 ENCOUNTER — Telehealth: Payer: Self-pay | Admitting: Neurology

## 2022-05-13 NOTE — Telephone Encounter (Signed)
Pt called stating that she is allergic to contrast and is not able to have it for her MRI that was ordered. Pt would like to know if this can be done without contrast. Please advise.

## 2022-05-13 NOTE — Telephone Encounter (Signed)
Called the patient back and advised the order is entered for w/o contrast. They have her scheduled 05/19/22 to completed.Pt verbalized understanding.

## 2022-05-19 ENCOUNTER — Ambulatory Visit
Admission: RE | Admit: 2022-05-19 | Discharge: 2022-05-19 | Disposition: A | Discharge status: Home / Self Care | Payer: Medicare Other | Source: Ambulatory Visit | Attending: Neurology | Admitting: Neurology

## 2022-05-19 DIAGNOSIS — R269 Unspecified abnormalities of gait and mobility: Secondary | ICD-10-CM

## 2022-05-19 DIAGNOSIS — R42 Dizziness and giddiness: Secondary | ICD-10-CM

## 2022-05-19 DIAGNOSIS — G35 Multiple sclerosis: Secondary | ICD-10-CM

## 2022-05-24 ENCOUNTER — Encounter: Payer: Self-pay | Admitting: Neurology

## 2022-06-01 DIAGNOSIS — I872 Venous insufficiency (chronic) (peripheral): Secondary | ICD-10-CM | POA: Diagnosis not present

## 2022-06-01 DIAGNOSIS — L03116 Cellulitis of left lower limb: Secondary | ICD-10-CM | POA: Diagnosis not present

## 2022-06-24 ENCOUNTER — Ambulatory Visit: Payer: Medicare Other | Admitting: Neurology

## 2022-08-11 DIAGNOSIS — M13862 Other specified arthritis, left knee: Secondary | ICD-10-CM | POA: Diagnosis not present

## 2022-08-11 DIAGNOSIS — M13861 Other specified arthritis, right knee: Secondary | ICD-10-CM | POA: Diagnosis not present

## 2022-08-30 DIAGNOSIS — K219 Gastro-esophageal reflux disease without esophagitis: Secondary | ICD-10-CM | POA: Diagnosis not present

## 2022-08-30 DIAGNOSIS — I10 Essential (primary) hypertension: Secondary | ICD-10-CM | POA: Diagnosis not present

## 2022-08-30 DIAGNOSIS — E1169 Type 2 diabetes mellitus with other specified complication: Secondary | ICD-10-CM | POA: Diagnosis not present

## 2022-08-30 DIAGNOSIS — E039 Hypothyroidism, unspecified: Secondary | ICD-10-CM | POA: Diagnosis not present

## 2022-08-30 DIAGNOSIS — E7800 Pure hypercholesterolemia, unspecified: Secondary | ICD-10-CM | POA: Diagnosis not present

## 2022-08-30 DIAGNOSIS — G35 Multiple sclerosis: Secondary | ICD-10-CM | POA: Diagnosis not present

## 2022-08-30 DIAGNOSIS — M8580 Other specified disorders of bone density and structure, unspecified site: Secondary | ICD-10-CM | POA: Diagnosis not present

## 2022-09-06 ENCOUNTER — Other Ambulatory Visit: Payer: Self-pay | Admitting: Neurology

## 2022-09-06 DIAGNOSIS — M7989 Other specified soft tissue disorders: Secondary | ICD-10-CM | POA: Diagnosis not present

## 2022-09-06 NOTE — Telephone Encounter (Signed)
Patient is due for a refill on trazodone. Patient is up to date on her appointments.

## 2022-09-07 ENCOUNTER — Emergency Department (HOSPITAL_BASED_OUTPATIENT_CLINIC_OR_DEPARTMENT_OTHER): Payer: Medicare Other

## 2022-09-07 ENCOUNTER — Other Ambulatory Visit: Payer: Self-pay

## 2022-09-07 ENCOUNTER — Encounter (HOSPITAL_BASED_OUTPATIENT_CLINIC_OR_DEPARTMENT_OTHER): Payer: Self-pay | Admitting: Emergency Medicine

## 2022-09-07 ENCOUNTER — Emergency Department (HOSPITAL_BASED_OUTPATIENT_CLINIC_OR_DEPARTMENT_OTHER)
Admission: EM | Admit: 2022-09-07 | Discharge: 2022-09-07 | Disposition: A | Discharge status: Home / Self Care | Payer: Medicare Other | Attending: Emergency Medicine | Admitting: Emergency Medicine

## 2022-09-07 DIAGNOSIS — Z91040 Latex allergy status: Secondary | ICD-10-CM | POA: Insufficient documentation

## 2022-09-07 DIAGNOSIS — M7989 Other specified soft tissue disorders: Secondary | ICD-10-CM | POA: Diagnosis not present

## 2022-09-07 DIAGNOSIS — M7122 Synovial cyst of popliteal space [Baker], left knee: Secondary | ICD-10-CM | POA: Diagnosis not present

## 2022-09-07 DIAGNOSIS — R2243 Localized swelling, mass and lump, lower limb, bilateral: Secondary | ICD-10-CM | POA: Diagnosis present

## 2022-09-07 NOTE — ED Triage Notes (Signed)
Pt arrives to ED with c/o left leg swelling, redness, and blisters. This is a recurrent problem that started five years ago but this occurrence started x1 week ago. PCP sent pt to ED for DVT US for elevated D-Dimer yesterday.

## 2022-09-07 NOTE — ED Provider Notes (Signed)
[facility] Provider Note   CSN: [id number] Arrival date & time: 09/07/22  1157     History  Chief Complaint  Patient presents with   Leg Swelling    Jocelyn Wilson is a 71 y.o. female with a history of chronic lymphedema lower extremity presenting to ED with redness and pain in her left lower leg.  She reports her doctor tested her positive for D-dimer and told her to come in for DVT study of her left leg.  She has chronic swelling and blistering in both legs and weeping from edema.  She wears compression stockings.  She is also on a diuretic.  She reports she has had some worsening redness of her left lower leg.  Her doctor prescribed her doxycycline which she plans to begin tonight.  No prior history of DVT reported.  HPI     Home Medications Prior to Admission medications   Medication Sig Start Date End Date Taking? Authorizing Provider  atorvastatin (LIPITOR) 20 MG tablet Take 20 mg by mouth at bedtime.     [provider]  busPIRone (BUSPAR) 30 MG tablet TAKE 1 TABLET BY MOUTH 3 TIMES  DAILY 03/04/22   Sater, Nanine Means, MD  Cyanocobalamin (VITAMIN B12) 1000 MCG TBCR Take 1,000 mcg by mouth daily.     [provider]  esomeprazole (NEXIUM) 40 MG capsule TAKE 1 CAPSULE BY MOUTH  TWICE DAILY 11/11/21   Sater, Nanine Means, MD  ferrous sulfate 325 (65 FE) MG tablet Take 325 mg by mouth daily.     [provider]  furosemide (LASIX) 20 MG tablet Take 20 mg by mouth daily. 10/29/20   [provider]  Ibuprofen 200 MG CAPS Take 400 mg by mouth 3 (three) times daily as needed (headache or migraine).     [provider]  Insulin Pen Needle (BD PEN NEEDLE NANO U/F) 32G X 4 MM MISC 1 Package by Does not apply route 2 (two) times daily. 02/14/18   Dennard Nip D, MD  levothyroxine (SYNTHROID) 137 MCG tablet Take 137 mcg by mouth daily before breakfast.  01/22/20   [provider]  liraglutide (VICTOZA) 18 MG/3ML  SOPN ADMINISTER 0.6 MG UNDER THE SKIN EVERY MORNING Patient taking differently: Inject 0.6 mg into the skin daily with breakfast. 03/15/18   Dennard Nip D, MD  meclizine (ANTIVERT) 25 MG tablet TAKE 1 TABLET BY MOUTH 3  TIMES DAILY AS NEEDED FOR  DIZZINESS 03/04/22   Sater, Nanine Means, MD  metFORMIN (GLUCOPHAGE-XR) 500 MG 24 hr tablet Take 1,000 mg by mouth 2 (two) times daily. 02/01/20   [provider]  metoprolol succinate (TOPROL-XL) 50 MG 24 hr tablet TAKE 1 TABLET BY MOUTH  DAILY Patient taking differently: Take 50 mg by mouth daily. 06/13/18   Sater, Nanine Means, MD  ONE TOUCH ULTRA TEST test strip USE TO CHECK FASTING AND PRE-DINNER BLOOD GLUCOSE BID 12/29/15   [provider]  ONETOUCH DELICA LANCETS 56C MISC USE TO CHECK FASTING AND PRE-DINNER BLOOD GLUCOSE BID 12/29/15   [provider]  oxybutynin (DITROPAN-XL) 10 MG 24 hr tablet Take 1 tablet (10 mg total) by mouth at bedtime. 05/06/22   Sater, Nanine Means, MD  potassium citrate (UROCIT-K) 10 MEQ (1080 MG) SR tablet Take 20 mEq by mouth 3 (three) times daily with meals.     [provider]  rOPINIRole (REQUIP) 1 MG tablet TAKE 1 TABLET BY MOUTH 4 TIMES  DAILY 01/14/22  Sater, Nanine Means, MD  sertraline (ZOLOFT) 100 MG tablet TAKE 2 TABLETS BY MOUTH  DAILY 01/14/22   Sater, Nanine Means, MD  traMADol (ULTRAM) 50 MG tablet Take 1 tablet (50 mg total) by mouth 3 (three) times daily as needed. 12/16/21   Sater, Nanine Means, MD  traZODone (DESYREL) 50 MG tablet TAKE 1 TABLET(50 MG) BY MOUTH AT BEDTIME 09/06/22   Sater, Nanine Means, MD  VITAMIN D PO Take 5,000 Units by mouth daily.     [provider]      Allergies    Iron, Ciprofloxacin, Contrast media [iodinated contrast media], Latex, Other, Sulfa antibiotics, and Tape    Review of Systems   Review of Systems  Physical Exam Updated Vital Signs BP (!) 152/73 (BP Location: Left Wrist)   Pulse 77   Temp 98.6 F (37 C)   Resp (!) 22   Ht '4\' 9"'$  (1.448 m)   Wt  102.1 kg   SpO2 98%   BMI 48.69 kg/m  Physical Exam Constitutional:      General: She is not in acute distress. HENT:     Head: Normocephalic and atraumatic.  Eyes:     Conjunctiva/sclera: Conjunctivae normal.     Pupils: Pupils are equal, round, and reactive to light.  Cardiovascular:     Rate and Rhythm: Normal rate and regular rhythm.  Pulmonary:     Effort: Pulmonary effort is normal. No respiratory distress.  Abdominal:     General: There is no distension.     Tenderness: There is no abdominal tenderness.  Skin:    General: Skin is warm and dry.     Comments: Lymphedema swelling of lower extremity, blistering of the lower extremity, there is bilateral lower extremity redness, poor more notable on the left leg.  There is also somewhat worsening swelling of the left leg compared to right  Neurological:     General: No focal deficit present.     Mental Status: She is alert. Mental status is at baseline.  Psychiatric:        Mood and Affect: Mood normal.        Behavior: Behavior normal.     ED Results / Procedures / Treatments   Labs (all labs ordered are listed, but only abnormal results are displayed) Labs Reviewed - No data to display  EKG None  Radiology US Venous Img Lower Unilateral Left  Result Date: 09/07/2022 CLINICAL DATA:  Chronic left lower extremity swelling. EXAM: Left LOWER EXTREMITY VENOUS DOPPLER ULTRASOUND TECHNIQUE: Gray-scale sonography with compression, as well as color and duplex ultrasound, were performed to evaluate the deep venous system(s) from the level of the common femoral vein through the popliteal and proximal calf veins. COMPARISON:  None Available. FINDINGS: VENOUS Normal compressibility of the common femoral, superficial femoral, and popliteal veins, as well as the visualized calf veins. Visualized portions of profunda femoral vein and great saphenous vein unremarkable. No filling defects to suggest DVT on grayscale or color Doppler  imaging. Doppler waveforms show normal direction of venous flow, normal respiratory plasticity and response to augmentation. Limited views of the contralateral common femoral vein are unremarkable. OTHER Probable Baker's cyst seen in left popliteal fossa. Limitations: none IMPRESSION: No evidence of deep venous thrombosis seen in left lower extremity. Electronically Signed   By: Marijo Conception M.D.   On: 09/07/2022 14:54    Procedures Procedures    Medications Ordered in ED Medications - No data to display  ED Course/ Medical  Decision Making/ A&P                           Medical Decision Making  Patient is here with bilateral leg swelling, the left is more swollen than the right, with worsening redness.  Differential would include cellulitis versus DVT versus venous compression or venous stasis versus other  Vascular ultrasound was ordered and personally viewed interpreted, no acute DVT, there is a Baker's cyst noted.  This may be contributing to her waxing and waning edema of the left lower limb.  It is not clear whether this is truly cellulitis versus venous stasis dermatitis, but I think is reasonable for her to trial the antibiotics prescribed by her doctor.  She has follow-up with an orthopedic doctor tomorrow.  Okay for discharge.        Final Clinical Impression(s) / ED Diagnoses Final diagnoses:  Synovial cyst of left popliteal space    Rx / DC Orders ED Discharge Orders     None         Wyvonnia Dusky, MD 09/07/22 1559

## 2022-09-08 DIAGNOSIS — M13862 Other specified arthritis, left knee: Secondary | ICD-10-CM | POA: Diagnosis not present

## 2022-09-16 ENCOUNTER — Ambulatory Visit (HOSPITAL_BASED_OUTPATIENT_CLINIC_OR_DEPARTMENT_OTHER): Payer: Medicare Other | Admitting: Physical Therapy

## 2022-09-16 DIAGNOSIS — M7989 Other specified soft tissue disorders: Secondary | ICD-10-CM | POA: Diagnosis not present

## 2022-09-16 DIAGNOSIS — E1169 Type 2 diabetes mellitus with other specified complication: Secondary | ICD-10-CM | POA: Diagnosis not present

## 2022-09-22 ENCOUNTER — Encounter: Payer: Self-pay | Admitting: Vascular Surgery

## 2022-09-22 ENCOUNTER — Ambulatory Visit (INDEPENDENT_AMBULATORY_CARE_PROVIDER_SITE_OTHER): Payer: Medicare Other | Admitting: Vascular Surgery

## 2022-09-22 VITALS — BP 133/75 | HR 96 | Temp 98.4°F | Resp 14 | Ht <= 58 in | Wt 223.0 lb

## 2022-09-22 DIAGNOSIS — I872 Venous insufficiency (chronic) (peripheral): Secondary | ICD-10-CM | POA: Diagnosis not present

## 2022-09-22 DIAGNOSIS — I890 Lymphedema, not elsewhere classified: Secondary | ICD-10-CM | POA: Diagnosis not present

## 2022-09-22 NOTE — Progress Notes (Signed)
ASSESSMENT & PLAN   COMBINED CHRONIC VENOUS INSUFFICIENCY AND LYMPHEDEMA: This patient has not had formal venous reflux testing however based on her exam I think she has evidence of combined chronic venous insufficiency and lymphedema.  We have discussed the importance of intermittent leg elevation and the proper positioning for this.  I have encouraged her to avoid prolonged sitting and standing.  I have encouraged her to continue to use her compression stockings.  We also discussed the importance of exercise.  Her activity is somewhat limited because of her weight and her MS.  We discussed walking and water aerobics.  Unfortunately the pool that she has access to is too warm and so she does not tolerate this.  We have also discussed the importance of maintaining a healthy weight as central obesity especially increases lower extremity venous pressure.  If her swelling and symptoms do not improve then we will obtain formal venous reflux testing on the left to see if she has any options to address her venous disease.  In addition given that she has lymphedema with significant skin changes including hyperkeratosis and hyperpigmentation I think she may be a candidate for a bio tab lymphedema pump.  She would probably do best with a knee-high device along with the leg elevation device.  I will see her back in 1 month and we will reassess.  REASON FOR CONSULT:    Varicose veins.  The consult is requested by Marda Stalker, PA.  HPI:   Jocelyn Wilson is a 71 y.o. female who presents with bilateral lower extremity swelling.  Her symptoms are more significant on the left side.  She says intermittent problems with cellulitis in the left leg and is also had wounds with drainage.  She does have diabetes.  Her activity is somewhat limited by her obesity and MS.  She denies any previous history of DVT or venous procedures.  She does wear compression stockings which help some.  She does try to elevate her  legs.  I do not get any history of claudication or rest pain.  Past Medical History:  Diagnosis Date   Allergy    Anxiety    Arthritis    Asthma    does not use an inhaer- patient does not think she does   Back pain    Chronic kidney disease    KIDNEY STONES WITH STENTS   Depression    Diabetes ([place])    type II   Diverticula of colon    Dyspnea    if climbing steps   Eczema    Food allergy    Gallbladder problem    GERD (gastroesophageal reflux disease)    History of kidney stones    Hypercholesterolemia    Hyperlipidemia    Hypothyroidism    Joint pain    Kidney stones    Leg edema    Migraine headache    with pressure change   Multiple sclerosis (HCC)    Obesity    Palpitations    PONV (postoperative nausea and vomiting)    with breast reduction surgery in [place] from the ~ 1980's/1990's   Restless leg syndrome    Right sided sciatica 10/29/2015   Snoring    Thyroid disease    hyperthyroidism   Unsteady gait    Vertigo    Vision abnormalities     Family History  Problem Relation Age of Onset   Hyperlipidemia Mother    Glaucoma Mother    Hypertension Mother  Anxiety disorder Mother    Depression Mother    Heart disease Father    Cancer Father        prostate   Diabetes Father    Dementia Father    Hypertension Father    Hyperlipidemia Father    Sleep apnea Father    Obesity Father    Cancer Brother        esophageal   Arthritis Sister        RA    SOCIAL HISTORY: Social History   Tobacco Use   Smoking status: Former    Types: Cigarettes    Quit date: 1978    Years since quitting: 45.9   Smokeless tobacco: Never  Substance Use Topics   Alcohol use: No    Allergies  Allergen Reactions   Iron Anaphylaxis    Iv iron   Ciprofloxacin Nausea Only   Contrast Media [Iodinated Contrast Media] Itching and Rash   Latex Rash   Other Other (See Comments)    Raw apples. Cucumbers, hazelnuts, Raw vegetables, Raw fruits, except citrus      Sulfa Antibiotics Itching and Rash   Tape Rash    Adhesive tape    Current Outpatient Medications  Medication Sig Dispense Refill   atorvastatin (LIPITOR) 20 MG tablet Take 20 mg by mouth at bedtime.      busPIRone (BUSPAR) 30 MG tablet TAKE 1 TABLET BY MOUTH 3 TIMES  DAILY 270 tablet 1   Cyanocobalamin (VITAMIN B12) 1000 MCG TBCR Take 1,000 mcg by mouth daily.      esomeprazole (NEXIUM) 40 MG capsule TAKE 1 CAPSULE BY MOUTH  TWICE DAILY 180 capsule 3   ferrous sulfate 325 (65 FE) MG tablet Take 325 mg by mouth daily.      furosemide (LASIX) 20 MG tablet Take 20 mg by mouth daily.     Ibuprofen 200 MG CAPS Take 400 mg by mouth 3 (three) times daily as needed (headache or migraine).      Insulin Pen Needle (BD PEN NEEDLE NANO U/F) 32G X 4 MM MISC 1 Package by Does not apply route 2 (two) times daily. 100 each 0   levothyroxine (SYNTHROID) 137 MCG tablet Take 137 mcg by mouth daily before breakfast.      liraglutide (VICTOZA) 18 MG/3ML SOPN ADMINISTER 0.6 MG UNDER THE SKIN EVERY MORNING (Patient taking differently: Inject 0.6 mg into the skin daily with breakfast.) 3 mL 0   meclizine (ANTIVERT) 25 MG tablet TAKE 1 TABLET BY MOUTH 3  TIMES DAILY AS NEEDED FOR  DIZZINESS 270 tablet 1   metFORMIN (GLUCOPHAGE-XR) 500 MG 24 hr tablet Take 1,000 mg by mouth 2 (two) times daily.     metoprolol succinate (TOPROL-XL) 50 MG 24 hr tablet TAKE 1 TABLET BY MOUTH  DAILY (Patient taking differently: Take 50 mg by mouth daily.) 90 tablet 3   ONE TOUCH ULTRA TEST test strip USE TO CHECK FASTING AND PRE-DINNER BLOOD GLUCOSE BID  12   ONETOUCH DELICA LANCETS 05L MISC USE TO CHECK FASTING AND PRE-DINNER BLOOD GLUCOSE BID  12   oxybutynin (DITROPAN-XL) 10 MG 24 hr tablet Take 1 tablet (10 mg total) by mouth at bedtime. 30 tablet 5   potassium citrate (UROCIT-K) 10 MEQ (1080 MG) SR tablet Take 20 mEq by mouth 3 (three) times daily with meals.      rOPINIRole (REQUIP) 1 MG tablet TAKE 1 TABLET BY MOUTH 4 TIMES  DAILY  360 tablet 3   sertraline (ZOLOFT) 100  MG tablet TAKE 2 TABLETS BY MOUTH  DAILY 180 tablet 3   traMADol (ULTRAM) 50 MG tablet Take 1 tablet (50 mg total) by mouth 3 (three) times daily as needed. 90 tablet 5   traZODone (DESYREL) 50 MG tablet TAKE 1 TABLET(50 MG) BY MOUTH AT BEDTIME 90 tablet 1   VITAMIN D PO Take 5,000 Units by mouth daily.      No current facility-administered medications for this visit.    REVIEW OF SYSTEMS:  '[X]'$  denotes positive finding, '[ ]'$  denotes negative finding Cardiac  Comments:  Chest pain or chest pressure:    Shortness of breath upon exertion:    Short of breath when lying flat:    Irregular heart rhythm:        Vascular    Pain in calf, thigh, or hip brought on by ambulation:    Pain in feet at night that wakes you up from your sleep:     Blood clot in your veins:    Leg swelling:  x       Pulmonary    Oxygen at home:    Productive cough:     Wheezing:         Neurologic    Sudden weakness in arms or legs:     Sudden numbness in arms or legs:     Sudden onset of difficulty speaking or slurred speech:    Temporary loss of vision in one eye:     Problems with dizziness:         Gastrointestinal    Blood in stool:     Vomited blood:         Genitourinary    Burning when urinating:     Blood in urine:        Psychiatric    Major depression:         Hematologic    Bleeding problems:    Problems with blood clotting too easily:        Skin    Rashes or ulcers:        Constitutional    Fever or chills:    -  PHYSICAL EXAM:   Vitals:   09/22/22 1546  BP: 133/75  Pulse: 96  Resp: 14  Temp: 98.4 F (36.9 C)  TempSrc: Temporal  SpO2: 96%  Weight: 223 lb (101.2 kg)  Height: '4\' 10"'$  (1.473 m)   Body mass index is 46.61 kg/m. GENERAL: The patient is a well-nourished female, in no acute distress. The vital signs are documented above. CARDIAC: There is a regular rate and rhythm.  VASCULAR: I do not detect carotid bruits. She has  palpable posterior tibial pulses bilaterally. I did look at her left great saphenous vein in the distal thigh and did not demonstrate reflux here.  She will need formal venous reflux testing. She has significant bilateral lower extremity swelling which is more significant on the left side.  She has some mild cellulitis on the left is just completed course of doxycycline.     PULMONARY: There is good air exchange bilaterally without wheezing or rales. ABDOMEN: Soft and non-tender with normal pitched bowel sounds.  MUSCULOSKELETAL: There are no major deformities. NEUROLOGIC: No focal weakness or paresthesias are detected. SKIN: There are no ulcers or rashes noted. PSYCHIATRIC: The patient has a normal affect.  DATA:    VENOUS DUPLEX: I reviewed the venous duplex scan that was done most recently on 09/07/2022.  This was of the left lower extremity only.  There was no evidence of DVT in the left leg.  There was a Baker's cyst noted on the left.    She had previously had a venous duplex scan in June 2021 of both lower extremities which showed no evidence of DVT.    Deitra Mayo [facility]

## 2022-09-27 ENCOUNTER — Telehealth: Payer: Self-pay | Admitting: Neurology

## 2022-09-27 MED ORDER — BUSPIRONE HCL 30 MG PO TABS: 30.0000 mg | ORAL_TABLET | Freq: Three times a day (TID) | ORAL | 0 refills | Status: DC

## 2022-09-27 MED ORDER — MECLIZINE HCL 25 MG PO TABS: ORAL_TABLET | ORAL | 0 refills | Status: DC

## 2022-09-27 NOTE — Telephone Encounter (Signed)
Pt called stating she is needing a refill request for her meclizine (ANTIVERT) 25 MG tablet and her busPIRone (BUSPAR) 30 MG tablet sent in to [facility] for a 90 day qt

## 2022-09-27 NOTE — Telephone Encounter (Signed)
E-scribed refills. Pt needs f/u around 11/05/22.

## 2022-09-28 NOTE — Telephone Encounter (Signed)
Pt was called, after checking DPR a vm was left asking pt to call and schedule her 6 mo f/u(11-08-22)

## 2022-10-05 ENCOUNTER — Other Ambulatory Visit: Payer: Self-pay

## 2022-10-05 DIAGNOSIS — I872 Venous insufficiency (chronic) (peripheral): Secondary | ICD-10-CM

## 2022-10-05 DIAGNOSIS — I890 Lymphedema, not elsewhere classified: Secondary | ICD-10-CM

## 2022-10-12 ENCOUNTER — Telehealth: Payer: Self-pay | Admitting: Neurology

## 2022-10-12 MED ORDER — BUSPIRONE HCL 30 MG PO TABS: 30.0000 mg | ORAL_TABLET | Freq: Three times a day (TID) | ORAL | 0 refills | Status: DC

## 2022-10-12 NOTE — Telephone Encounter (Signed)
Pt is requesting a refill for busPIRone (BUSPAR) 30 MG tablet,meclizine (ANTIVERT) 25 MG tablet,metoprolol succinate (TOPROL-XL) 50 MG 24 hr tablet,esomeprazole (NEXIUM) 40 MG capsule .  Pharmacy: [facility]

## 2022-10-12 NOTE — Telephone Encounter (Signed)
Rx sent for buspirone 30 mg tablet. Meclizine was filled on 12/18.23. Metoprolol has not been filled by Dr. Felecia Shelling since 2019. Nexium and Metoprolol will need to be filled by PCP.

## 2022-10-20 ENCOUNTER — Encounter (HOSPITAL_COMMUNITY): Payer: Medicare Other

## 2022-11-02 ENCOUNTER — Other Ambulatory Visit: Payer: Self-pay | Admitting: Neurology

## 2022-11-02 NOTE — Telephone Encounter (Signed)
Patient follow up visit scheduled on 11/09/22.      Rx already approved today. Pharmacy requesting 90 day supply.

## 2022-11-02 NOTE — Telephone Encounter (Signed)
Patient follow up visit scheduled on 11/09/22.

## 2022-11-09 ENCOUNTER — Ambulatory Visit: Payer: Medicare Other | Admitting: Neurology

## 2022-11-09 ENCOUNTER — Encounter: Payer: Self-pay | Admitting: Neurology

## 2022-11-09 VITALS — BP 119/82 | HR 84 | Ht <= 58 in | Wt 218.0 lb

## 2022-11-09 DIAGNOSIS — R42 Dizziness and giddiness: Secondary | ICD-10-CM

## 2022-11-09 DIAGNOSIS — E119 Type 2 diabetes mellitus without complications: Secondary | ICD-10-CM | POA: Diagnosis not present

## 2022-11-09 DIAGNOSIS — M549 Dorsalgia, unspecified: Secondary | ICD-10-CM

## 2022-11-09 DIAGNOSIS — M4326 Fusion of spine, lumbar region: Secondary | ICD-10-CM | POA: Diagnosis not present

## 2022-11-09 DIAGNOSIS — F411 Generalized anxiety disorder: Secondary | ICD-10-CM

## 2022-11-09 DIAGNOSIS — R269 Unspecified abnormalities of gait and mobility: Secondary | ICD-10-CM | POA: Diagnosis not present

## 2022-11-09 DIAGNOSIS — G35 Multiple sclerosis: Secondary | ICD-10-CM

## 2022-11-09 DIAGNOSIS — M5136 Other intervertebral disc degeneration, lumbar region: Secondary | ICD-10-CM

## 2022-11-09 MED ORDER — TROSPIUM CHLORIDE 20 MG PO TABS
20.0000 mg | ORAL_TABLET | Freq: Two times a day (BID) | ORAL | 11 refills | Status: DC
Start: 2022-11-09 — End: 2023-12-22

## 2022-11-09 MED ORDER — FUROSEMIDE 20 MG PO TABS
20.0000 mg | ORAL_TABLET | Freq: Two times a day (BID) | ORAL | 3 refills | Status: DC
Start: 2022-11-09 — End: 2023-12-22

## 2022-11-09 NOTE — Progress Notes (Signed)
[facility]  PATIENT: Jocelyn Wilson DOB: 12/23/1950  REFERRING DOCTOR OR PCP:  Adaku Nnodi SOURCE: Patient  _________________________________   HISTORICAL  CHIEF COMPLAINT:  Chief Complaint  Patient presents with   Follow-up    Rm 2, w daughter. Here for to f/u for daily dizziness. Pt reports feeling dizzy all day as soon as she wakes up. No falls since last, only near falls. Using a walker at all times.     HISTORY OF PRESENT ILLNESS:  Jocelyn Wilson is a 72 y.o. woman with multiple sclerosis, back pain (facet syndrome) s/p fusion, and migraines.  Update 11/09/22: She has vertigo with a spinning quality that occurs intermittently.    It is worse with HA.     Meclizine has not helped.   Many years ago, she had tried valium for anxiety or migraines but did not like how she felt.    She has not been on a DMT for about 5 years and MRI has not shown any activity.     No exacerbations  She has tingling in her left leg    She has surgery by  Dr. Annette Stable and also has had RFA x 2 without benefit for the LBP at [facility].   Years ago, MBB/RFA had helped.   LBP did not improve much after surgery.   She is seeing [facility]  (Dr. Davy Pique).  The surgery was Left L3L4 anterior lateral retroperitoneal interbody decompression. (Dr. Annette Stable).    Dr. Annette Stable did x-rays with flexion and extension and she was told the fusion looked good.     She takes tramadol rarely, just about once or twice a week as it makes her sleepy.    She did PT and gluteus massage has helped.       She has MS and has been fairly stable off of Betaseron (since 2019).   She was on Betaseron  > 10 years.   She had right ON in the past and currently has no sequelae (had minimal).   She has a low plaque burden and has had no new lesions x years.     She has reduced gait (likely combo of MS and LB issues),   She has urge incontinence.   Oxybutynin XL helped the bladder but caused dry mouth.       She has depression and anxiety, about the same as last visit.   .   She is on BuSpar, and Zoloft (200 mg daily)  MS History:   Her MS was diagnosed around 2004.    She was actually seeing me for migraine headache when she presented with optic neuritis and vertigo and had another MRI.   She had interval development of multiple new foci and underwent a lumbar puncture.  CSF was also consistent with MS.   She was placed on Betaseron and continues on that medication with good control of the MS. No definite exacerbations.  Due to long-term debility, duration of her MS and her age, the decision was made to stop the Betaseron in 2019.  She has not had any exacerbations.  IMAGING 12/27/2020 MRI brain was unchanged compared to 2020.    11/03/2018 MRI of the lumbar spine shows that there has been significant changes at L3-L4 noted on the current MRI that have progressed compared to the 2017 MRI.  Specifically, she now has spinal stenosis at L3-L4 with an AP diameter of 8.2 mm.  That diameter was 10.8 mm in 2017.  Additionally, there is about 2 mm of anterolisthesis likely due to the facet hypertrophy that has progressed.  I believe it is playing some role in her pain.     11/03/2018 MRI of the brain shows that the MS is mild and stable.  She has a 12-millimeter extra-axial mass in the olfactory groove on the right that was not present on MRI 11/09/2017.  It is hyperintense on T2 and FLAIR images and hypointense on T1-weighted images.  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  I am concerned about the rapid growth from not being visible to 12 mm in just 1 year.  I recommend that we repeat the MRI.   She has had an allergic reaction to contrast in the past so we would do the study without.  11/13/2018  Lumbar flex/ext  IMPRESSION: Mild dynamic instability at the L3-4 level, up to 5 mm anterolisthesis with patient standing in flexion. This is facet mediated, based on plain film appearance as well as  review of MR.  Severe degenerative disease L4-5, near complete loss of interspace Height.  05/15/2019 MRI of the brain shows stable MS.  The extra-axial mass in the right olfactory groove (the study is also noncontrasted due to patient's intolerance of contrast in the past) actually appears a little smaller than on the earlier MRI.  04/15/2015 MRI of the cervical spie showed no MS lesions and did have DJD.      REVIEW OF SYSTEMS: Constitutional: No fevers, chills, sweats, or change in appetite Eyes: No visual changes, double vision, eye pain Ear, nose and throat: No hearing loss, ear pain, nasal congestion, sore throat Cardiovascular: No chest pain, palpitations Respiratory:  No shortness of breath at rest or with exertion.   No wheezes GastrointestinaI: No nausea, vomiting, diarrhea, abdominal pain, fecal incontinence Genitourinary:  No dysuria, urinary retention or frequency.  No nocturia. Musculoskeletal:  No neck pain, back pain Integumentary: No rash, pruritus, skin lesions Neurological: as above Psychiatric: No depression at this time.  No anxiety Endocrine: No palpitations, diaphoresis, change in appetite, change in weigh or increased thirst Hematologic/Lymphatic:  No anemia, purpura, petechiae. Allergic/Immunologic: No itchy/runny eyes, nasal congestion, recent allergic reactions, rashes  ALLERGIES: Allergies  Allergen Reactions   Iron Anaphylaxis    Iv iron   Ciprofloxacin Nausea Only   Contrast Media [Iodinated Contrast Media] Itching and Rash   Latex Rash   Other Other (See Comments)    Raw apples. Cucumbers, hazelnuts, Raw vegetables, Raw fruits, except citrus     Sulfa Antibiotics Itching and Rash   Tape Rash    Adhesive tape    HOME MEDICATIONS:  Current Outpatient Medications:    atorvastatin (LIPITOR) 20 MG tablet, Take 20 mg by mouth at bedtime. , Disp: , Rfl:    busPIRone (BUSPAR) 30 MG tablet, TAKE 1 TABLET BY MOUTH 3 TIMES  DAILY, Disp: 270 tablet, Rfl: 1    Cyanocobalamin (VITAMIN B12) 1000 MCG TBCR, Take 1,000 mcg by mouth daily. , Disp: , Rfl:    esomeprazole (NEXIUM) 40 MG capsule, TAKE 1 CAPSULE BY MOUTH  TWICE DAILY, Disp: 180 capsule, Rfl: 3   ferrous sulfate 325 (65 FE) MG tablet, Take 325 mg by mouth daily. , Disp: , Rfl:    furosemide (LASIX) 20 MG tablet, Take 20 mg by mouth daily., Disp: , Rfl:    Ibuprofen 200 MG CAPS, Take 400 mg by mouth 3 (three) times daily as needed (headache or migraine). , Disp: , Rfl:  Insulin Pen Needle (BD PEN NEEDLE NANO U/F) 32G X 4 MM MISC, 1 Package by Does not apply route 2 (two) times daily., Disp: 100 each, Rfl: 0   levothyroxine (SYNTHROID) 137 MCG tablet, Take 137 mcg by mouth daily before breakfast. , Disp: , Rfl:    liraglutide (VICTOZA) 18 MG/3ML SOPN, ADMINISTER 0.6 MG UNDER THE SKIN EVERY MORNING (Patient taking differently: Inject 0.6 mg into the skin daily with breakfast.), Disp: 3 mL, Rfl: 0   meclizine (ANTIVERT) 25 MG tablet, TAKE 1 TABLET BY MOUTH 3  TIMES DAILY AS NEEDED FOR  DIZZINESS, Disp: 270 tablet, Rfl: 1   metFORMIN (GLUCOPHAGE-XR) 500 MG 24 hr tablet, Take 1,000 mg by mouth 2 (two) times daily., Disp: , Rfl:    metoprolol succinate (TOPROL-XL) 50 MG 24 hr tablet, TAKE 1 TABLET BY MOUTH  DAILY (Patient taking differently: Take 50 mg by mouth daily.), Disp: 90 tablet, Rfl: 3   ONE TOUCH ULTRA TEST test strip, USE TO CHECK FASTING AND PRE-DINNER BLOOD GLUCOSE BID, Disp: , Rfl: 12   ONETOUCH DELICA LANCETS 69C MISC, USE TO CHECK FASTING AND PRE-DINNER BLOOD GLUCOSE BID, Disp: , Rfl: 12   potassium citrate (UROCIT-K) 10 MEQ (1080 MG) SR tablet, Take 20 mEq by mouth 3 (three) times daily with meals. , Disp: , Rfl:    rOPINIRole (REQUIP) 1 MG tablet, TAKE 1 TABLET BY MOUTH 4 TIMES  DAILY, Disp: 360 tablet, Rfl: 3   sertraline (ZOLOFT) 100 MG tablet, TAKE 2 TABLETS BY MOUTH  DAILY, Disp: 180 tablet, Rfl: 3   traMADol (ULTRAM) 50 MG tablet, Take 1 tablet (50 mg total) by mouth 3 (three) times  daily as needed., Disp: 90 tablet, Rfl: 5   traZODone (DESYREL) 50 MG tablet, Take 1 tablet (50 mg total) by mouth at bedtime., Disp: 90 tablet, Rfl: 1   VITAMIN D PO, Take 5,000 Units by mouth daily. , Disp: , Rfl:   PAST MEDICAL HISTORY: Past Medical History:  Diagnosis Date   Allergy    Anxiety    Arthritis    Asthma    does not use an inhaer- patient does not think she does   Back pain    Chronic kidney disease    KIDNEY STONES WITH STENTS   Depression    Diabetes ([place])    type II   Diverticula of colon    Dyspnea    if climbing steps   Eczema    Food allergy    Gallbladder problem    GERD (gastroesophageal reflux disease)    History of kidney stones    Hypercholesterolemia    Hyperlipidemia    Hypothyroidism    Joint pain    Kidney stones    Leg edema    Migraine headache    with pressure change   Multiple sclerosis (HCC)    Obesity    Palpitations    PONV (postoperative nausea and vomiting)    with breast reduction surgery in [place] from the ~ 1980's/1990's   Restless leg syndrome    Right sided sciatica 10/29/2015   Snoring    Thyroid disease    hyperthyroidism   Unsteady gait    Vertigo    Vision abnormalities     PAST SURGICAL HISTORY: Past Surgical History:  Procedure Laterality Date   ANTERIOR LATERAL LUMBAR FUSION WITH PERCUTANEOUS SCREW 1 LEVEL Left 02/22/2020   Procedure: Anterior Lateral Lumbar Fusion - Lumbar Three-Lumbar Four with percutaneous pedicle screws;  Surgeon: Earnie Larsson, MD;  Location: [facility];  Service: Neurosurgery;  Laterality: Left;  Anterior Lateral Lumbar Fusion - Lumbar Three-Lumbar Four with percutaneous pedicle screws   BREAST SURGERY     AUGMENTATION   carpel tunnel Bilateral    CHOLECYSTECTOMY     DILATION AND CURETTAGE OF UTERUS  1983   ESOPHAGOGASTRODUODENOSCOPY     FINGER SURGERY  x8    trigger finger all except left pinky- right pinky didnt work   LITHOTRIPSY     NISSEN FUNDOPLICATION     VENTRAL HERNIA REPAIR   07/25/2012   Procedure: LAPAROSCOPIC VENTRAL HERNIA;  Surgeon: Adin Hector, MD;  Location: [facility];  Service: General;  Laterality: N/A;    FAMILY HISTORY: Family History  Problem Relation Age of Onset   Hyperlipidemia Mother    Glaucoma Mother    Hypertension Mother    Anxiety disorder Mother    Depression Mother    Heart disease Father    Cancer Father        prostate   Diabetes Father    Dementia Father    Hypertension Father    Hyperlipidemia Father    Sleep apnea Father    Obesity Father    Cancer Brother        esophageal   Arthritis Sister        RA    SOCIAL HISTORY:  Social History   Socioeconomic History   Marital status: Married    Spouse name: Ellenore Roscoe   Number of children: 1   Years of education: Not on file   Highest education level: Not on file  Occupational History   Occupation: retired  Tobacco Use   Smoking status: Former    Types: Cigarettes    Quit date: 1978    Years since quitting: 45.5   Smokeless tobacco: Never  Vaping Use   Vaping Use: Never used  Substance and Sexual Activity   Alcohol use: No   Drug use: No   Sexual activity: Not on file  Other Topics Concern   Not on file  Social History Narrative   Not on file   Social Determinants of Health   Financial Resource Strain: Not on file  Food Insecurity: Not on file  Transportation Needs: Not on file  Physical Activity: Not on file  Stress: Not on file  Social Connections: Not on file  Intimate Partner Violence: Not on file     PHYSICAL EXAM  Vitals:   05/05/22 1333  BP: 137/67  Pulse: 84  Weight: 227 lb 8 oz (103.2 kg)  Height: '4\' 9"'$  (1.448 m)    Body mass index is 49.23 kg/m.   General: The patient is well-developed and well-nourished and in no acute distress  Musculoskeletal:  Lower lumbar paraspinals are mildly tender.  Mildly tender over right trochanteric bursa and mild over piriformis muscles.   She has reduced range of motion in the  back.  Neurologic Exam  Mental status: The patient is alert and oriented x 3 at the time of the examination. The patient has apparent normal recent and remote memory.  Focus/attention appeared to be a little reduced today.Marland Kitchen   Speech is normal.  Cranial nerves: Extraocular movements are full.  Facial strength and sensation is normal.  Trapezius strength is normal.  Hearing is symmetric.  Normal Rinne.   Weber does not lateralize.  Motor:  Muscle bulk is normal.   Tone is normal. Strength is  5 / 5 in all 4 extremities.   Sensory: Sensory  testing is intact to soft touch.     Gait and station: Station is normal.  Gait is arthritic and wide without walker but does better with it.  She is unable to do tandem walk.    Romberg negative  Reflexes: Deep tendon reflexes are symmetric and normal bilaterally.      ______________________________________________  Multiple sclerosis ([place]) - Plan: MR CERVICAL SPINE WO CONTRAST, MR BRAIN W WO CONTRAST  Functional gait disorder with tremor  Vertigo - Plan: MR BRAIN W WO CONTRAST  Gait disturbance - Plan: MR CERVICAL SPINE WO CONTRAST, MR BRAIN W WO CONTRAST   1.   D/c meclizine.  Consider diazepam if vertigo worsens 2.   She can take the tramadol on a scheduled basis.      F/u with [facility].  3.   stay active and exercise as tolerated. 4.   Change oxybutynin to trospium as less dry mouth usually.   If not better,  consider Myrbetriq 5.   She is on Mounjaro now and is hoping to lose more weight --- we discussed that could help her back some. 6.   Return in 6 months or sooner if there are new or worsening neurologic symptoms.    Senaya Dicenso A. Felecia Shelling, MD, PhD 7/62/2633, 3:54 PM Certified in Neurology, Clinical Neurophysiology, Sleep Medicine, Pain Medicine and Neuroimaging  [facility] [address] [place], [place] [address] [phone]  ;,

## 2022-11-10 ENCOUNTER — Other Ambulatory Visit: Payer: Self-pay | Admitting: Neurology

## 2022-11-10 ENCOUNTER — Telehealth: Payer: Self-pay | Admitting: Neurology

## 2022-11-10 MED ORDER — SCOPOLAMINE 1 MG/3DAYS TD PT72: 1.0000 | MEDICATED_PATCH | TRANSDERMAL | 11 refills | Status: AC

## 2022-11-10 NOTE — Telephone Encounter (Signed)
Patient informed. Rx sent 

## 2022-11-10 NOTE — Telephone Encounter (Signed)
Pt is calling. Stated she needs a prescription for a nausea patch. Stated they don't sell them over the counter.

## 2022-11-17 DIAGNOSIS — H2513 Age-related nuclear cataract, bilateral: Secondary | ICD-10-CM | POA: Diagnosis not present

## 2022-11-17 DIAGNOSIS — H25013 Cortical age-related cataract, bilateral: Secondary | ICD-10-CM | POA: Diagnosis not present

## 2022-11-24 ENCOUNTER — Ambulatory Visit: Payer: Medicare Other | Admitting: Vascular Surgery

## 2022-11-24 ENCOUNTER — Ambulatory Visit (HOSPITAL_COMMUNITY)
Admission: RE | Admit: 2022-11-24 | Discharge: 2022-11-24 | Disposition: A | Discharge status: Home / Self Care | Payer: Medicare Other | Source: Ambulatory Visit | Attending: Vascular Surgery | Admitting: Vascular Surgery

## 2022-11-24 ENCOUNTER — Encounter: Payer: Self-pay | Admitting: Vascular Surgery

## 2022-11-24 VITALS — BP 152/82 | HR 92 | Temp 98.3°F | Resp 14 | Ht <= 58 in | Wt 216.0 lb

## 2022-11-24 DIAGNOSIS — I890 Lymphedema, not elsewhere classified: Secondary | ICD-10-CM

## 2022-11-24 DIAGNOSIS — I872 Venous insufficiency (chronic) (peripheral): Secondary | ICD-10-CM | POA: Diagnosis not present

## 2022-11-24 NOTE — Progress Notes (Addendum)
REASON FOR VISIT:   Follow-up of combined chronic venous insufficiency and lymphedema.  MEDICAL ISSUES:   COMBINED CHRONIC VENOUS INSUFFICIENCY AND LYMPHEDEMA: This patient does have some deep venous reflux in the common femoral vein.  I think she also has significant lymphedema.  She has been doing conservative measures including elevation, compression stockings, and exercise.  She is continuing to have significant swelling.  I think she would be a good candidate for a knee-high [employer] lymphedema pump.  This should be used in conjunction with proper leg elevation to maximize venous and lymphatic drainage.  We will make arrangements with [employer] to further assess this option.  Of note she has no significant superficial venous disease amenable to intervention.  She will call if her swelling worsens in the meantime.   HPI:   Jocelyn Wilson is a pleasant 72 y.o. female who I last saw on 09/22/2022 with combined chronic venous insufficiency and lymphedema.  She had not had formal venous reflux testing at that time however based on her exam I felt that she had evidence of chronic venous insufficiency and lymphedema.  We discussed the importance of intermittent leg elevation and the proper positioning for this.  I encouraged her to avoid prolonged sitting and standing and we discussed the use of compression therapy.  We also discussed the importance of exercise.  We also discussed the importance of maintaining a healthy weight.  I felt that she would benefit from formal venous reflux testing and comes in for a follow-up visit.  In addition I felt that if her swelling did not improve she would be a good candidate for a bio tab lymphedema pump.  I felt the knee-high device along with proper leg elevation would be optimal.  She comes in for a follow-up visit.  Since I saw her last, she states that the swelling has improved although recently she started having more swelling.  This may have been because she  has been sitting more.  She has been wearing her compression stockings.  She does elevate her legs.  Her exercise is somewhat limited by her weight and back issues but she does try to walk is much as possible.  Thus she has been elevating her legs, wearing her compression stockings, and exercising now for over 4 weeks.  Past Medical History:  Diagnosis Date   Allergy    Anxiety    Arthritis    Asthma    does not use an inhaer- patient does not think she does   Back pain    Chronic kidney disease    KIDNEY STONES WITH STENTS   Depression    Diabetes ([place])    type II   Diverticula of colon    Dyspnea    if climbing steps   Eczema    Food allergy    Gallbladder problem    GERD (gastroesophageal reflux disease)    History of kidney stones    Hypercholesterolemia    Hyperlipidemia    Hypothyroidism    Joint pain    Kidney stones    Leg edema    Migraine headache    with pressure change   Multiple sclerosis (H[facility])    Obesity    Palpitations    PONV (postoperative nausea and vomiting)    with breast reduction surgery in [place] from the ~ 1980's/1990's   Restless leg syndrome    Right sided sciatica 10/29/2015   Snoring    Thyroid disease    hyperthyroidism  Unsteady gait    Vertigo    Vision abnormalities     Family History  Problem Relation Age of Onset   Hyperlipidemia Mother    Glaucoma Mother    Hypertension Mother    Anxiety disorder Mother    Depression Mother    Heart disease Father    Cancer Father        prostate   Diabetes Father    Dementia Father    Hypertension Father    Hyperlipidemia Father    Sleep apnea Father    Obesity Father    Cancer Brother        esophageal   Arthritis Sister        RA    SOCIAL HISTORY: Social History   Tobacco Use   Smoking status: Former    Types: Cigarettes    Quit date: 1978    Years since quitting: 46.1   Smokeless tobacco: Never  Substance Use Topics   Alcohol use: No    Allergies  Allergen Reactions    Iron Anaphylaxis    Iv iron   Ciprofloxacin Nausea Only   Contrast Media [Iodinated Contrast Media] Itching and Rash   Latex Rash   Other Other (See Comments)    Raw apples. Cucumbers, hazelnuts, Raw vegetables, Raw fruits, except citrus     Sulfa Antibiotics Itching and Rash   Tape Rash    Adhesive tape    Current Outpatient Medications  Medication Sig Dispense Refill   atorvastatin (LIPITOR) 20 MG tablet Take 20 mg by mouth at bedtime.      busPIRone (BUSPAR) 30 MG tablet Take 1 tablet (30 mg total) by mouth 3 (three) times daily. 270 tablet 0   Cyanocobalamin (VITAMIN B12) 1000 MCG TBCR Take 1,000 mcg by mouth daily.      esomeprazole (NEXIUM) 40 MG capsule TAKE 1 CAPSULE BY MOUTH  TWICE DAILY 180 capsule 3   ferrous sulfate 325 (65 FE) MG tablet Take 325 mg by mouth daily.      furosemide (LASIX) 20 MG tablet Take 1 tablet (20 mg total) by mouth 2 (two) times daily. 180 tablet 3   Ibuprofen 200 MG CAPS Take 400 mg by mouth 3 (three) times daily as needed (headache or migraine).      Insulin Pen Needle (BD PEN NEEDLE NANO U/F) 32G X 4 MM MISC 1 Package by Does not apply route 2 (two) times daily. 100 each 0   levothyroxine (SYNTHROID) 137 MCG tablet Take 137 mcg by mouth daily before breakfast.      metFORMIN (GLUCOPHAGE-XR) 500 MG 24 hr tablet Take 1,000 mg by mouth 2 (two) times daily.     metoprolol succinate (TOPROL-XL) 50 MG 24 hr tablet TAKE 1 TABLET BY MOUTH  DAILY (Patient taking differently: Take 50 mg by mouth daily.) 90 tablet 3   ONE TOUCH ULTRA TEST test strip USE TO CHECK FASTING AND PRE-DINNER BLOOD GLUCOSE BID  12   ONETOUCH DELICA LANCETS 99991111 MISC USE TO CHECK FASTING AND PRE-DINNER BLOOD GLUCOSE BID  12   potassium citrate (UROCIT-K) 10 MEQ (1080 MG) SR tablet Take 20 mEq by mouth 3 (three) times daily with meals.      rOPINIRole (REQUIP) 1 MG tablet TAKE 1 TABLET BY MOUTH 4 TIMES  DAILY 360 tablet 3   scopolamine (TRANSDERM-SCOP) 1 MG/3DAYS Place 1 patch (1.5 mg  total) onto the skin every 3 (three) days. 10 patch 11   sertraline (ZOLOFT) 100 MG tablet TAKE 2  TABLETS BY MOUTH  DAILY 180 tablet 3   tirzepatide (MOUNJARO) 2.5 MG/0.5ML Pen Inject 2.5 mg into the skin once a week.     traMADol (ULTRAM) 50 MG tablet Take 1 tablet (50 mg total) by mouth 3 (three) times daily as needed. 90 tablet 5   traZODone (DESYREL) 50 MG tablet TAKE 1 TABLET(50 MG) BY MOUTH AT BEDTIME 90 tablet 1   trospium (SANCTURA) 20 MG tablet Take 1 tablet (20 mg total) by mouth 2 (two) times daily. 60 tablet 11   VITAMIN D PO Take 5,000 Units by mouth daily.      liraglutide (VICTOZA) 18 MG/3ML SOPN ADMINISTER 0.6 MG UNDER THE SKIN EVERY MORNING (Patient not taking: Reported on 11/09/2022) 3 mL 0   meclizine (ANTIVERT) 25 MG tablet TAKE 1 TABLET BY MOUTH 3  TIMES DAILY AS NEEDED FOR  DIZZINESS (Patient not taking: Reported on 11/24/2022) 270 tablet 0   No current facility-administered medications for this visit.    REVIEW OF SYSTEMS:  [X]  denotes positive finding, [ ]  denotes negative finding Cardiac  Comments:  Chest pain or chest pressure:    Shortness of breath upon exertion:    Short of breath when lying flat:    Irregular heart rhythm:        Vascular    Pain in calf, thigh, or hip brought on by ambulation:    Pain in feet at night that wakes you up from your sleep:     Blood clot in your veins:    Leg swelling:  x       Pulmonary    Oxygen at home:    Productive cough:     Wheezing:         Neurologic    Sudden weakness in arms or legs:     Sudden numbness in arms or legs:     Sudden onset of difficulty speaking or slurred speech:    Temporary loss of vision in one eye:     Problems with dizziness:         Gastrointestinal    Blood in stool:     Vomited blood:         Genitourinary    Burning when urinating:     Blood in urine:        Psychiatric    Major depression:         Hematologic    Bleeding problems:    Problems with blood clotting too  easily:        Skin    Rashes or ulcers: x       Constitutional    Fever or chills:     PHYSICAL EXAM:   Vitals:   11/24/22 1621  BP: (!) 152/82  Pulse: 92  Resp: 14  Temp: 98.3 F (36.8 C)  TempSrc: Temporal  SpO2: 96%  Weight: 216 lb (98 kg)  Height: 4\' 9"  (1.448 m)    GENERAL: The patient is a well-nourished female, in no acute distress. The vital signs are documented above. CARDIAC: There is a regular rate and rhythm.  VASCULAR: I do not detect carotid bruits. She has palpable posterior tibial pulses bilaterally. She has significant bilateral lower extremity swelling which is more significant on the left side.  She has hyperkeratosis.  PULMONARY: There is good air exchange bilaterally without wheezing or rales. ABDOMEN: Soft and non-tender with normal pitched bowel sounds.  MUSCULOSKELETAL: There are no major deformities or cyanosis. NEUROLOGIC: No focal weakness or paresthesias  are detected. SKIN: There are no ulcers or rashes noted. PSYCHIATRIC: The patient has a normal affect.  DATA:    VENOUS DUPLEX: I have dependently interpreted her venous duplex scan today.  This was of the left lower extremity only.  There was no evidence of DVT.  There was deep venous reflux involving the common femoral vein.  There was no significant superficial venous reflux.  Deitra Mayo [facility] [phone]

## 2022-11-26 DIAGNOSIS — L900 Lichen sclerosus et atrophicus: Secondary | ICD-10-CM | POA: Diagnosis not present

## 2022-11-26 DIAGNOSIS — B372 Candidiasis of skin and nail: Secondary | ICD-10-CM | POA: Diagnosis not present

## 2022-11-26 DIAGNOSIS — I872 Venous insufficiency (chronic) (peripheral): Secondary | ICD-10-CM | POA: Diagnosis not present

## 2022-11-26 DIAGNOSIS — L304 Erythema intertrigo: Secondary | ICD-10-CM | POA: Diagnosis not present

## 2022-12-03 DIAGNOSIS — K219 Gastro-esophageal reflux disease without esophagitis: Secondary | ICD-10-CM | POA: Diagnosis not present

## 2022-12-03 DIAGNOSIS — I10 Essential (primary) hypertension: Secondary | ICD-10-CM | POA: Diagnosis not present

## 2022-12-03 DIAGNOSIS — G35 Multiple sclerosis: Secondary | ICD-10-CM | POA: Diagnosis not present

## 2022-12-03 DIAGNOSIS — E7800 Pure hypercholesterolemia, unspecified: Secondary | ICD-10-CM | POA: Diagnosis not present

## 2022-12-03 DIAGNOSIS — E1169 Type 2 diabetes mellitus with other specified complication: Secondary | ICD-10-CM | POA: Diagnosis not present

## 2022-12-03 DIAGNOSIS — E039 Hypothyroidism, unspecified: Secondary | ICD-10-CM | POA: Diagnosis not present

## 2022-12-09 ENCOUNTER — Telehealth: Payer: Self-pay | Admitting: Neurology

## 2022-12-09 NOTE — Telephone Encounter (Addendum)
Left message for patient to call. Per note 11/09/22 pt should follow up with pain management for medication.

## 2022-12-09 NOTE — Telephone Encounter (Signed)
Pt request refill for traMADol (ULTRAM) 50 MG tablet at [facility] [id number]  Also request refill be for a 100-day supply. Insurance said cover a 100-day supply for the price of a 90-day supply.

## 2022-12-13 ENCOUNTER — Other Ambulatory Visit: Payer: Self-pay | Admitting: Neurology

## 2022-12-13 NOTE — Telephone Encounter (Signed)
Pt called back and requesting a call back from nurse to discuss traMADol (ULTRAM) 50 MG tablet refills.

## 2022-12-13 NOTE — Telephone Encounter (Signed)
Last seen on 11/09/22

## 2022-12-13 NOTE — Telephone Encounter (Signed)
Left message on pt voicemail to call again to relay the following and it appears Dr.Sater wanted her to follow up with pain management.   Per note on 11/09/22  "2. She can take the tramadol on a scheduled basis. F/u with [facility]. "

## 2022-12-14 NOTE — Telephone Encounter (Signed)
Pt called stating that she does not understand why the provider here at our office will not continue to provide her Tramadol. I explained to the pt that per provider she will have to continue receiving this medication through pain management. She stated that they did nothing for her and I explained that she would have to be advised by them to see what other options they have for her because we no longer manage her Tramadol. Pt stated "Thanks for nothing" and call was disconnected.

## 2022-12-14 NOTE — Telephone Encounter (Signed)
Dr. Felecia Shelling- [facility]

## 2023-01-12 ENCOUNTER — Telehealth: Payer: Self-pay | Admitting: Neurology

## 2023-01-12 NOTE — Telephone Encounter (Signed)
error 

## 2023-01-19 DIAGNOSIS — I890 Lymphedema, not elsewhere classified: Secondary | ICD-10-CM | POA: Diagnosis not present

## 2023-01-24 ENCOUNTER — Other Ambulatory Visit: Payer: Self-pay | Admitting: Neurology

## 2023-01-25 NOTE — Telephone Encounter (Signed)
Last seen on 11/09/22 Follow up scheduled on 02/28/23 Last filled on 11/20/22 # 260 tablets (90 day supply) Rx pending to signed

## 2023-02-14 ENCOUNTER — Other Ambulatory Visit: Payer: Self-pay | Admitting: Neurology

## 2023-02-14 NOTE — Telephone Encounter (Signed)
Last seen on 11/09/22 per note "Change oxybutynin to trospium as less dry mouth usually. If not better, consider Myrbetriq  Follow up scheduled on 02/28/23

## 2023-02-15 ENCOUNTER — Other Ambulatory Visit (HOSPITAL_COMMUNITY): Payer: Self-pay

## 2023-02-15 ENCOUNTER — Other Ambulatory Visit (HOSPITAL_BASED_OUTPATIENT_CLINIC_OR_DEPARTMENT_OTHER): Payer: Self-pay

## 2023-02-15 MED ORDER — MOUNJARO 5 MG/0.5ML ~~LOC~~ SOAJ
5.0000 mg | SUBCUTANEOUS | 0 refills | Status: DC
Start: 2023-02-08 — End: 2023-04-04
  Filled 2023-02-15: qty 2, 28d supply, fill #0

## 2023-02-16 ENCOUNTER — Other Ambulatory Visit (HOSPITAL_COMMUNITY): Payer: Self-pay

## 2023-02-16 ENCOUNTER — Other Ambulatory Visit: Payer: Self-pay | Admitting: Neurology

## 2023-02-16 DIAGNOSIS — Z1231 Encounter for screening mammogram for malignant neoplasm of breast: Secondary | ICD-10-CM | POA: Diagnosis not present

## 2023-02-16 NOTE — Telephone Encounter (Signed)
Pt last seen on 11/09/22 per note "She has depression and anxiety, about the same as last visit.   .   She is on BuSpar, and Zoloft (200 mg daily) "  Follow up scheduled on 02/28/23  Last filled on 12/16/22 #180 (90 day supply)

## 2023-02-17 ENCOUNTER — Other Ambulatory Visit (HOSPITAL_COMMUNITY): Payer: Self-pay

## 2023-02-28 ENCOUNTER — Other Ambulatory Visit (HOSPITAL_COMMUNITY): Payer: Self-pay

## 2023-02-28 ENCOUNTER — Ambulatory Visit: Payer: Medicare Other | Admitting: Neurology

## 2023-02-28 ENCOUNTER — Encounter: Payer: Self-pay | Admitting: Neurology

## 2023-02-28 VITALS — BP 125/79 | HR 80 | Ht <= 58 in | Wt 210.0 lb

## 2023-02-28 DIAGNOSIS — M5136 Other intervertebral disc degeneration, lumbar region: Secondary | ICD-10-CM

## 2023-02-28 DIAGNOSIS — R269 Unspecified abnormalities of gait and mobility: Secondary | ICD-10-CM

## 2023-02-28 DIAGNOSIS — G35 Multiple sclerosis: Secondary | ICD-10-CM | POA: Diagnosis not present

## 2023-02-28 DIAGNOSIS — M549 Dorsalgia, unspecified: Secondary | ICD-10-CM | POA: Diagnosis not present

## 2023-02-28 DIAGNOSIS — M4326 Fusion of spine, lumbar region: Secondary | ICD-10-CM | POA: Diagnosis not present

## 2023-02-28 DIAGNOSIS — M51369 Other intervertebral disc degeneration, lumbar region without mention of lumbar back pain or lower extremity pain: Secondary | ICD-10-CM

## 2023-02-28 DIAGNOSIS — G35D Multiple sclerosis, unspecified: Secondary | ICD-10-CM

## 2023-02-28 DIAGNOSIS — F418 Other specified anxiety disorders: Secondary | ICD-10-CM

## 2023-02-28 MED ORDER — TRAMADOL HCL 50 MG PO TABS: 50.0000 mg | ORAL_TABLET | Freq: Three times a day (TID) | ORAL | 5 refills | Status: DC | PRN

## 2023-02-28 NOTE — Progress Notes (Signed)
[facility]  PATIENT: Jocelyn Wilson DOB: 04/10/1951  REFERRING DOCTOR OR PCP:  Adaku Nnodi SOURCE: Patient  _________________________________   HISTORICAL  CHIEF COMPLAINT:  Chief Complaint  Patient presents with   Follow-up    Pt in room 10,husband in room. Here for MS follow up, pt said stable from MS standpoint. Pt said she had a fall, but fell in chair. Balance is the same. Needs refill on ultram.     HISTORY OF PRESENT ILLNESS:  Jocelyn Wilson is a 72 y.o. woman with multiple sclerosis, back pain (facet syndrome) s/p fusion, and migraines.  Update 5/202024 She has not been on a DMT since 2019 and MRI has not shown any activity.     No exacerbations  She continues to have lower back pain and is no longer radiating down into the leg.    She has surgery by  Dr. Jordan Likes and also has had RFA x 2 without benefit for the LBP at [facility].   Years ago, MBB/RFA had helped when I was in [place]   LBP did not improve much after surgery.   The surgery was Left L3L4 anterior lateral retroperitoneal interbody decompression. (Dr. Jordan Likes). In May 2021    Pain is worse if she stands a long time (like doing dishes).     She was taking tramadol rarely, just about once or twice a week as it made her sleepy.  However, she did note that the pain was better when she took it.  We discussed tramadol on a more regular basis  She did PT and gluteus massage has helped.       She has MS and has been fairly stable off of Betaseron (since 2019).   She was on Betaseron  > 10 years.   She had right ON in the past and currently has no sequelae (had minimal).   She has a low plaque burden and has had no new lesions x years.     She has reduced gait (likely combo of MS and LB issues),   She has urge incontinence.   Oxybutynin XL helped the bladder but caused dry mouth.      She has depression and anxiety, about the same as last visit.   .   She is on BuSpar, and Zoloft (200 mg  daily)  She is on Mounjaro and has lost 13 pound in just  MS History:   Her MS was diagnosed around 2004.    She was actually seeing me for migraine headache when she presented with optic neuritis and vertigo and had another MRI.   She had interval development of multiple new foci and underwent a lumbar puncture.  CSF was also consistent with MS.   She was placed on Betaseron and continues on that medication with good control of the MS. No definite exacerbations.  Due to long-term debility, duration of her MS and her age, the decision was made to stop the Betaseron in 2019.  She has not had any exacerbations.  IMAGING 12/27/2020 MRI brain was unchanged compared to 2020.    11/03/2018 MRI of the lumbar spine shows that there has been significant changes at L3-L4 noted on the current MRI that have progressed compared to the 2017 MRI.  Specifically, she now has spinal stenosis at L3-L4 with an AP diameter of 8.2 mm.  That diameter was 10.8 mm in 2017.  Additionally, there is about 2 mm of anterolisthesis likely due to the facet hypertrophy that  has progressed.  I believe it is playing some role in her pain.     11/03/2018 MRI of the brain shows that the MS is mild and stable.  She has a 12-millimeter extra-axial mass in the olfactory groove on the right that was not present on MRI 11/09/2017.  It is hyperintense on T2 and FLAIR images and hypointense on T1-weighted images.  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  I am concerned about the rapid growth from not being visible to 12 mm in just 1 year.  I recommend that we repeat the MRI.   She has had an allergic reaction to contrast in the past so we would do the study without.  11/13/2018  Lumbar flex/ext  IMPRESSION: Mild dynamic instability at the L3-4 level, up to 5 mm anterolisthesis with patient standing in flexion. This is facet mediated, based on plain film appearance as well as review of MR.  Severe degenerative disease L4-5, near  complete loss of interspace Height.  05/15/2019 MRI of the brain shows stable MS.  The extra-axial mass in the right olfactory groove (the study is also noncontrasted due to patient's intolerance of contrast in the past) actually appears a little smaller than on the earlier MRI.  04/15/2015 MRI of the cervical spie showed no MS lesions and did have DJD.      REVIEW OF SYSTEMS: Constitutional: No fevers, chills, sweats, or change in appetite Eyes: No visual changes, double vision, eye pain Ear, nose and throat: No hearing loss, ear pain, nasal congestion, sore throat Cardiovascular: No chest pain, palpitations Respiratory:  No shortness of breath at rest or with exertion.   No wheezes GastrointestinaI: No nausea, vomiting, diarrhea, abdominal pain, fecal incontinence Genitourinary:  No dysuria, urinary retention or frequency.  No nocturia. Musculoskeletal:  No neck pain, back pain Integumentary: No rash, pruritus, skin lesions Neurological: as above Psychiatric: No depression at this time.  No anxiety Endocrine: No palpitations, diaphoresis, change in appetite, change in weigh or increased thirst Hematologic/Lymphatic:  No anemia, purpura, petechiae. Allergic/Immunologic: No itchy/runny eyes, nasal congestion, recent allergic reactions, rashes  ALLERGIES: Allergies  Allergen Reactions   Iron Anaphylaxis    Iv iron   Ciprofloxacin Nausea Only   Contrast Media [Iodinated Contrast Media] Itching and Rash   Latex Rash   Other Other (See Comments)    Raw apples. Cucumbers, hazelnuts, Raw vegetables, Raw fruits, except citrus     Sulfa Antibiotics Itching and Rash   Tape Rash    Adhesive tape    HOME MEDICATIONS:  Current Outpatient Medications:    atorvastatin (LIPITOR) 20 MG tablet, Take 20 mg by mouth at bedtime. , Disp: , Rfl:    busPIRone (BUSPAR) 30 MG tablet, Take 1 tablet (30 mg total) by mouth 3 (three) times daily., Disp: 270 tablet, Rfl: 0   Cyanocobalamin (VITAMIN B12)  1000 MCG TBCR, Take 1,000 mcg by mouth daily. , Disp: , Rfl:    esomeprazole (NEXIUM) 40 MG capsule, TAKE 1 CAPSULE BY MOUTH TWICE  DAILY, Disp: 180 capsule, Rfl: 3   ferrous sulfate 325 (65 FE) MG tablet, Take 325 mg by mouth daily. , Disp: , Rfl:    furosemide (LASIX) 20 MG tablet, Take 1 tablet (20 mg total) by mouth 2 (two) times daily., Disp: 180 tablet, Rfl: 3   Ibuprofen 200 MG CAPS, Take 400 mg by mouth 3 (three) times daily as needed (headache or migraine). , Disp: , Rfl:    Insulin Pen Needle (BD  PEN NEEDLE NANO U/F) 32G X 4 MM MISC, 1 Package by Does not apply route 2 (two) times daily., Disp: 100 each, Rfl: 0   levothyroxine (SYNTHROID) 137 MCG tablet, Take 137 mcg by mouth daily before breakfast. , Disp: , Rfl:    metFORMIN (GLUCOPHAGE-XR) 500 MG 24 hr tablet, Take 1,000 mg by mouth 2 (two) times daily., Disp: , Rfl:    metoprolol succinate (TOPROL-XL) 50 MG 24 hr tablet, TAKE 1 TABLET BY MOUTH  DAILY (Patient taking differently: Take 50 mg by mouth daily.), Disp: 90 tablet, Rfl: 3   ONE TOUCH ULTRA TEST test strip, USE TO CHECK FASTING AND PRE-DINNER BLOOD GLUCOSE BID, Disp: , Rfl: 12   ONETOUCH DELICA LANCETS 33G MISC, USE TO CHECK FASTING AND PRE-DINNER BLOOD GLUCOSE BID, Disp: , Rfl: 12   potassium citrate (UROCIT-K) 10 MEQ (1080 MG) SR tablet, Take 20 mEq by mouth 3 (three) times daily with meals. , Disp: , Rfl:    rOPINIRole (REQUIP) 1 MG tablet, TAKE 1 TABLET BY MOUTH 4 TIMES  DAILY, Disp: 360 tablet, Rfl: 0   scopolamine (TRANSDERM-SCOP) 1 MG/3DAYS, Place 1 patch (1.5 mg total) onto the skin every 3 (three) days., Disp: 10 patch, Rfl: 11   sertraline (ZOLOFT) 100 MG tablet, TAKE 2 TABLETS BY MOUTH  DAILY, Disp: 180 tablet, Rfl: 3   tirzepatide (MOUNJARO) 5 MG/0.5ML Pen, Inject 5 mg into the skin once a week., Disp: 2 mL, Rfl: 0   traZODone (DESYREL) 50 MG tablet, TAKE 1 TABLET(50 MG) BY MOUTH AT BEDTIME, Disp: 90 tablet, Rfl: 1   VITAMIN D PO, Take 5,000 Units by mouth daily. ,  Disp: , Rfl:    liraglutide (VICTOZA) 18 MG/3ML SOPN, ADMINISTER 0.6 MG UNDER THE SKIN EVERY MORNING (Patient not taking: Reported on 11/09/2022), Disp: 3 mL, Rfl: 0   meclizine (ANTIVERT) 25 MG tablet, TAKE 1 TABLET BY MOUTH 3  TIMES DAILY AS NEEDED FOR  DIZZINESS (Patient not taking: Reported on 11/24/2022), Disp: 270 tablet, Rfl: 0   tirzepatide (MOUNJARO) 2.5 MG/0.5ML Pen, Inject 2.5 mg into the skin once a week. (Patient not taking: Reported on 02/28/2023), Disp: , Rfl:    traMADol (ULTRAM) 50 MG tablet, Take 1 tablet (50 mg total) by mouth 3 (three) times daily as needed., Disp: 90 tablet, Rfl: 5   trospium (SANCTURA) 20 MG tablet, Take 1 tablet (20 mg total) by mouth 2 (two) times daily. (Patient not taking: Reported on 02/28/2023), Disp: 60 tablet, Rfl: 11  PAST MEDICAL HISTORY: Past Medical History:  Diagnosis Date   Allergy    Anxiety    Arthritis    Asthma    does not use an inhaer- patient does not think she does   Back pain    Chronic kidney disease    KIDNEY STONES WITH STENTS   Depression    Diabetes (HCC)    type II   Diverticula of colon    Dyspnea    if climbing steps   Eczema    Food allergy    Gallbladder problem    GERD (gastroesophageal reflux disease)    History of kidney stones    Hypercholesterolemia    Hyperlipidemia    Hypothyroidism    Joint pain    Kidney stones    Leg edema    Migraine headache    with pressure change   Multiple sclerosis (HCC)    Obesity    Palpitations    PONV (postoperative nausea and vomiting)  with breast reduction surgery in [place] from the ~ 1980's/1990's   Restless leg syndrome    Right sided sciatica 10/29/2015   Snoring    Thyroid disease    hyperthyroidism   Unsteady gait    Vertigo    Vision abnormalities     PAST SURGICAL HISTORY: Past Surgical History:  Procedure Laterality Date   ANTERIOR LATERAL LUMBAR FUSION WITH PERCUTANEOUS SCREW 1 LEVEL Left 02/22/2020   Procedure: Anterior Lateral Lumbar Fusion -  Lumbar Three-Lumbar Four with percutaneous pedicle screws;  Surgeon: Julio Sicks, MD;  Location: [facility] OR;  Service: Neurosurgery;  Laterality: Left;  Anterior Lateral Lumbar Fusion - Lumbar Three-Lumbar Four with percutaneous pedicle screws   BREAST SURGERY     AUGMENTATION   carpel tunnel Bilateral    CHOLECYSTECTOMY     DILATION AND CURETTAGE OF UTERUS  1983   ESOPHAGOGASTRODUODENOSCOPY     FINGER SURGERY  x8    trigger finger all except left pinky- right pinky didnt work   LITHOTRIPSY     NISSEN FUNDOPLICATION     VENTRAL HERNIA REPAIR  07/25/2012   Procedure: LAPAROSCOPIC VENTRAL HERNIA;  Surgeon: Ernestene Mention, MD;  Location: [facility] OR;  Service: General;  Laterality: N/A;    FAMILY HISTORY: Family History  Problem Relation Age of Onset   Hyperlipidemia Mother    Glaucoma Mother    Hypertension Mother    Anxiety disorder Mother    Depression Mother    Heart disease Father    Cancer Father        prostate   Diabetes Father    Dementia Father    Hypertension Father    Hyperlipidemia Father    Sleep apnea Father    Obesity Father    Cancer Brother        esophageal   Arthritis Sister        RA    SOCIAL HISTORY:  Social History   Socioeconomic History   Marital status: Married    Spouse name: Jackelynn Dorenkamp   Number of children: 1   Years of education: Not on file   Highest education level: Not on file  Occupational History   Occupation: retired  Tobacco Use   Smoking status: Former    Types: Cigarettes    Quit date: 1978    Years since quitting: 46.4   Smokeless tobacco: Never  Vaping Use   Vaping Use: Never used  Substance and Sexual Activity   Alcohol use: No   Drug use: No   Sexual activity: Not on file  Other Topics Concern   Not on file  Social History Narrative   Not on file   Social Determinants of Health   Financial Resource Strain: Not on file  Food Insecurity: Not on file  Transportation Needs: Not on file  Physical Activity: Not on  file  Stress: Not on file  Social Connections: Not on file  Intimate Partner Violence: Not on file     PHYSICAL EXAM  Vitals:   02/28/23 1409  BP: 125/79  Pulse: 80  Weight: 210 lb (95.3 kg)  Height: 4\' 10"  (1.473 m)    Body mass index is 43.89 kg/m.   General: The patient is well-developed and well-nourished and in no acute distress  Musculoskeletal:  There is lower lumbar paraspinals tenderness.    She has reduced range of motion in the back.  Neurologic Exam  Mental status: The patient is alert and oriented x 3 at the time of the  examination. The patient has apparent normal recent and remote memory.  Focus/attention appeared to be a little reduced today.Marland Kitchen   Speech is normal.  Cranial nerves: Extraocular movements are full.  Facial strength and sensation is normal.  Trapezius strength is normal.  Hearing is symmetric.  Normal Rinne.   Weber does not lateralize.  Motor:  Muscle bulk is normal.   Tone is normal. Strength is  5 / 5 in all 4 extremities.   Sensory: Sensory testing is intact to soft touch.     Gait and station: Station is normal.  Her gait is arthritic and wide without walker but does better with it.  She is unable to do tandem walk.    Romberg negative  Reflexes: Deep tendon reflexes are symmetric and normal bilaterally.      ______________________________________________  Multiple sclerosis (HCC)  Gait disturbance  Lumbar degenerative disc disease  Back pain, unspecified back location, unspecified back pain laterality, unspecified chronicity  Fusion of lumbar spine  Depression with anxiety   1.   She can take the tramadol on a scheduled basis up to 3 times a day.  We will write the prescription as she no longer sees pain management.   2.   stay active and exercise as tolerated. 3.   Change oxybutynin to trospium as less dry mouth usually.   If not better,  consider Myrbetriq 4.   She is on Mounjaro now and is hoping to lose more weight --- we  discussed that could help her back some. 5.   Return in 6 months or sooner if there are new or worsening neurologic symptoms.    Carmalita Wakefield A. Epimenio Foot, MD, PhD 02/28/2023, 7:52 PM Certified in Neurology, Clinical Neurophysiology, Sleep Medicine, Pain Medicine and Neuroimaging  [facility] [address], [address] [place], [place] [address] [phone]  ;,

## 2023-03-01 ENCOUNTER — Telehealth: Payer: Self-pay | Admitting: *Deleted

## 2023-03-01 ENCOUNTER — Encounter: Payer: Self-pay | Admitting: Neurology

## 2023-03-01 NOTE — Telephone Encounter (Signed)
Received fax from [facility] that tramadol on list of covered drugs. PA not required.

## 2023-03-02 ENCOUNTER — Other Ambulatory Visit: Payer: Self-pay | Admitting: Neurology

## 2023-03-02 NOTE — Telephone Encounter (Signed)
Called [facility] at [phone]. Spoke w/ tech. Insurance only covers 7 days at a time. Advised we actually got a fax from [facility] yesterday that no PA needed. I provided them pharmacy help desk phone# to call to further inquire why #[phone] will not go through. Phone: [phone]. They will let us know if anything further is needed after speaking with them.

## 2023-03-10 DIAGNOSIS — M545 Low back pain, unspecified: Secondary | ICD-10-CM | POA: Diagnosis not present

## 2023-03-10 DIAGNOSIS — M13861 Other specified arthritis, right knee: Secondary | ICD-10-CM | POA: Diagnosis not present

## 2023-03-10 DIAGNOSIS — M5136 Other intervertebral disc degeneration, lumbar region: Secondary | ICD-10-CM | POA: Diagnosis not present

## 2023-03-10 DIAGNOSIS — M25562 Pain in left knee: Secondary | ICD-10-CM | POA: Insufficient documentation

## 2023-03-10 DIAGNOSIS — M13862 Other specified arthritis, left knee: Secondary | ICD-10-CM | POA: Diagnosis not present

## 2023-03-14 ENCOUNTER — Other Ambulatory Visit (HOSPITAL_COMMUNITY): Payer: Self-pay

## 2023-03-14 MED ORDER — MOUNJARO 2.5 MG/0.5ML ~~LOC~~ SOAJ
2.5000 mg | SUBCUTANEOUS | 0 refills | Status: DC
  Filled 2023-03-14: qty 2, 28d supply, fill #0

## 2023-03-16 ENCOUNTER — Other Ambulatory Visit (HOSPITAL_COMMUNITY): Payer: Self-pay

## 2023-03-17 ENCOUNTER — Other Ambulatory Visit (HOSPITAL_COMMUNITY): Payer: Self-pay

## 2023-03-17 MED ORDER — MOUNJARO 2.5 MG/0.5ML ~~LOC~~ SOAJ
2.5000 mg | SUBCUTANEOUS | 0 refills | Status: DC
  Filled 2023-03-17: qty 2, 28d supply, fill #0

## 2023-03-18 ENCOUNTER — Other Ambulatory Visit (HOSPITAL_COMMUNITY): Payer: Self-pay

## 2023-03-25 ENCOUNTER — Other Ambulatory Visit (HOSPITAL_COMMUNITY): Payer: Self-pay

## 2023-03-30 DIAGNOSIS — E039 Hypothyroidism, unspecified: Secondary | ICD-10-CM | POA: Diagnosis not present

## 2023-03-30 DIAGNOSIS — E1169 Type 2 diabetes mellitus with other specified complication: Secondary | ICD-10-CM | POA: Diagnosis not present

## 2023-03-30 DIAGNOSIS — E7800 Pure hypercholesterolemia, unspecified: Secondary | ICD-10-CM | POA: Diagnosis not present

## 2023-04-04 ENCOUNTER — Other Ambulatory Visit (HOSPITAL_COMMUNITY): Payer: Self-pay

## 2023-04-04 DIAGNOSIS — E559 Vitamin D deficiency, unspecified: Secondary | ICD-10-CM | POA: Diagnosis not present

## 2023-04-04 DIAGNOSIS — E039 Hypothyroidism, unspecified: Secondary | ICD-10-CM | POA: Diagnosis not present

## 2023-04-04 DIAGNOSIS — M8580 Other specified disorders of bone density and structure, unspecified site: Secondary | ICD-10-CM | POA: Diagnosis not present

## 2023-04-04 DIAGNOSIS — J3489 Other specified disorders of nose and nasal sinuses: Secondary | ICD-10-CM | POA: Diagnosis not present

## 2023-04-04 DIAGNOSIS — I10 Essential (primary) hypertension: Secondary | ICD-10-CM | POA: Diagnosis not present

## 2023-04-04 DIAGNOSIS — Z9989 Dependence on other enabling machines and devices: Secondary | ICD-10-CM | POA: Diagnosis not present

## 2023-04-04 DIAGNOSIS — G35 Multiple sclerosis: Secondary | ICD-10-CM | POA: Diagnosis not present

## 2023-04-04 DIAGNOSIS — E1169 Type 2 diabetes mellitus with other specified complication: Secondary | ICD-10-CM | POA: Diagnosis not present

## 2023-04-04 DIAGNOSIS — E7800 Pure hypercholesterolemia, unspecified: Secondary | ICD-10-CM | POA: Diagnosis not present

## 2023-04-04 MED ORDER — MUPIROCIN 2 % EX OINT
TOPICAL_OINTMENT | Freq: Two times a day (BID) | CUTANEOUS | 0 refills | Status: AC | PRN
  Filled 2023-04-04: qty 22, 10d supply, fill #0

## 2023-04-04 MED ORDER — MOUNJARO 5 MG/0.5ML ~~LOC~~ SOAJ
5.0000 mg | SUBCUTANEOUS | 0 refills | Status: DC
  Filled 2023-04-04: qty 2, 28d supply, fill #0

## 2023-04-05 ENCOUNTER — Other Ambulatory Visit (HOSPITAL_COMMUNITY): Payer: Self-pay

## 2023-04-07 DIAGNOSIS — M7989 Other specified soft tissue disorders: Secondary | ICD-10-CM | POA: Diagnosis not present

## 2023-04-17 ENCOUNTER — Other Ambulatory Visit: Payer: Self-pay | Admitting: Neurology

## 2023-04-19 ENCOUNTER — Other Ambulatory Visit: Payer: Self-pay

## 2023-04-19 ENCOUNTER — Other Ambulatory Visit: Payer: Self-pay | Admitting: Neurology

## 2023-04-19 NOTE — Telephone Encounter (Signed)
Last seen on 02/28/23  Follow up scheduled on 09/06/23

## 2023-05-09 ENCOUNTER — Other Ambulatory Visit (HOSPITAL_COMMUNITY): Payer: Self-pay

## 2023-05-10 ENCOUNTER — Ambulatory Visit: Payer: Medicare Other | Admitting: Neurology

## 2023-05-10 ENCOUNTER — Other Ambulatory Visit (HOSPITAL_COMMUNITY): Payer: Self-pay

## 2023-05-10 MED ORDER — MOUNJARO 5 MG/0.5ML ~~LOC~~ SOAJ
5.0000 mg | SUBCUTANEOUS | 2 refills | Status: DC
  Filled 2023-05-10: qty 2, 28d supply, fill #0
  Filled 2023-06-06: qty 2, 28d supply, fill #1
  Filled 2023-07-04: qty 2, 28d supply, fill #2

## 2023-05-11 ENCOUNTER — Other Ambulatory Visit (HOSPITAL_COMMUNITY): Payer: Self-pay

## 2023-05-23 DIAGNOSIS — Z87442 Personal history of urinary calculi: Secondary | ICD-10-CM | POA: Diagnosis not present

## 2023-06-06 ENCOUNTER — Other Ambulatory Visit (HOSPITAL_COMMUNITY): Payer: Self-pay

## 2023-06-10 DIAGNOSIS — R8290 Unspecified abnormal findings in urine: Secondary | ICD-10-CM | POA: Diagnosis not present

## 2023-06-10 DIAGNOSIS — Z87442 Personal history of urinary calculi: Secondary | ICD-10-CM | POA: Diagnosis not present

## 2023-06-14 ENCOUNTER — Other Ambulatory Visit (HOSPITAL_COMMUNITY): Payer: Self-pay

## 2023-07-04 ENCOUNTER — Other Ambulatory Visit (HOSPITAL_COMMUNITY): Payer: Self-pay

## 2023-07-05 ENCOUNTER — Other Ambulatory Visit (HOSPITAL_COMMUNITY): Payer: Self-pay

## 2023-07-21 DIAGNOSIS — M13861 Other specified arthritis, right knee: Secondary | ICD-10-CM | POA: Diagnosis not present

## 2023-07-21 DIAGNOSIS — M13862 Other specified arthritis, left knee: Secondary | ICD-10-CM | POA: Diagnosis not present

## 2023-07-22 DIAGNOSIS — H2513 Age-related nuclear cataract, bilateral: Secondary | ICD-10-CM | POA: Diagnosis not present

## 2023-07-22 DIAGNOSIS — H25013 Cortical age-related cataract, bilateral: Secondary | ICD-10-CM | POA: Diagnosis not present

## 2023-07-22 DIAGNOSIS — E119 Type 2 diabetes mellitus without complications: Secondary | ICD-10-CM | POA: Diagnosis not present

## 2023-07-22 DIAGNOSIS — H524 Presbyopia: Secondary | ICD-10-CM | POA: Diagnosis not present

## 2023-07-22 DIAGNOSIS — H52203 Unspecified astigmatism, bilateral: Secondary | ICD-10-CM | POA: Diagnosis not present

## 2023-08-02 ENCOUNTER — Other Ambulatory Visit (HOSPITAL_COMMUNITY): Payer: Self-pay

## 2023-08-02 MED ORDER — MOUNJARO 7.5 MG/0.5ML ~~LOC~~ SOAJ
7.5000 mg | SUBCUTANEOUS | 1 refills | Status: AC
  Filled 2023-08-02: qty 2, 28d supply, fill #0
  Filled 2023-08-31: qty 2, 28d supply, fill #1

## 2023-08-04 ENCOUNTER — Other Ambulatory Visit: Payer: Self-pay

## 2023-08-05 ENCOUNTER — Other Ambulatory Visit (HOSPITAL_COMMUNITY): Payer: Self-pay

## 2023-08-09 ENCOUNTER — Ambulatory Visit (HOSPITAL_COMMUNITY): Admission: RE | Admit: 2023-08-09 | Payer: Medicare Other | Source: Ambulatory Visit

## 2023-08-09 ENCOUNTER — Encounter (HOSPITAL_COMMUNITY): Payer: Self-pay

## 2023-08-09 ENCOUNTER — Other Ambulatory Visit (HOSPITAL_COMMUNITY): Payer: Self-pay | Admitting: Pain Medicine

## 2023-08-09 DIAGNOSIS — R52 Pain, unspecified: Secondary | ICD-10-CM

## 2023-08-09 DIAGNOSIS — M79605 Pain in left leg: Secondary | ICD-10-CM | POA: Diagnosis not present

## 2023-08-10 ENCOUNTER — Ambulatory Visit (HOSPITAL_COMMUNITY)
Admission: RE | Admit: 2023-08-10 | Discharge: 2023-08-10 | Disposition: A | Discharge status: Home / Self Care | Payer: Medicare Other | Source: Ambulatory Visit | Attending: Pain Medicine | Admitting: Pain Medicine

## 2023-08-10 DIAGNOSIS — R52 Pain, unspecified: Secondary | ICD-10-CM | POA: Diagnosis not present

## 2023-08-13 ENCOUNTER — Other Ambulatory Visit: Payer: Self-pay | Admitting: Neurology

## 2023-08-15 ENCOUNTER — Other Ambulatory Visit: Payer: Self-pay

## 2023-08-17 ENCOUNTER — Telehealth: Payer: Self-pay | Admitting: Neurology

## 2023-08-17 MED ORDER — BUSPIRONE HCL 30 MG PO TABS: 30.0000 mg | ORAL_TABLET | Freq: Three times a day (TID) | ORAL | 1 refills | Status: DC

## 2023-08-17 NOTE — Telephone Encounter (Signed)
Pt is requesting a 90 day supply of busPIRone (BUSPAR) 30 MG tablet [[id number]] to be sent in to [facility].

## 2023-08-17 NOTE — Telephone Encounter (Signed)
E-scribed refill 

## 2023-08-17 NOTE — Addendum Note (Signed)
Addended by: Arther Abbott on: 08/17/2023 10:13 AM   Modules accepted: Orders

## 2023-08-29 DIAGNOSIS — Z9181 History of falling: Secondary | ICD-10-CM | POA: Diagnosis not present

## 2023-08-29 DIAGNOSIS — Z0000 Encounter for general adult medical examination without abnormal findings: Secondary | ICD-10-CM | POA: Diagnosis not present

## 2023-08-31 ENCOUNTER — Other Ambulatory Visit (HOSPITAL_COMMUNITY): Payer: Self-pay

## 2023-09-06 ENCOUNTER — Ambulatory Visit: Payer: Medicare Other | Admitting: Neurology

## 2023-09-26 ENCOUNTER — Other Ambulatory Visit (HOSPITAL_COMMUNITY): Payer: Self-pay

## 2023-09-27 ENCOUNTER — Other Ambulatory Visit (HOSPITAL_COMMUNITY): Payer: Self-pay

## 2023-09-27 DIAGNOSIS — G35 Multiple sclerosis: Secondary | ICD-10-CM | POA: Diagnosis not present

## 2023-09-27 DIAGNOSIS — E7800 Pure hypercholesterolemia, unspecified: Secondary | ICD-10-CM | POA: Diagnosis not present

## 2023-09-27 DIAGNOSIS — E1169 Type 2 diabetes mellitus with other specified complication: Secondary | ICD-10-CM | POA: Diagnosis not present

## 2023-09-27 DIAGNOSIS — E559 Vitamin D deficiency, unspecified: Secondary | ICD-10-CM | POA: Diagnosis not present

## 2023-09-27 DIAGNOSIS — I10 Essential (primary) hypertension: Secondary | ICD-10-CM | POA: Diagnosis not present

## 2023-09-27 DIAGNOSIS — E039 Hypothyroidism, unspecified: Secondary | ICD-10-CM | POA: Diagnosis not present

## 2023-09-27 DIAGNOSIS — M8580 Other specified disorders of bone density and structure, unspecified site: Secondary | ICD-10-CM | POA: Diagnosis not present

## 2023-09-27 MED ORDER — MOUNJARO 7.5 MG/0.5ML ~~LOC~~ SOAJ
7.5000 mg | SUBCUTANEOUS | 0 refills | Status: DC
  Filled 2023-09-28: qty 6, 84d supply, fill #0

## 2023-09-28 ENCOUNTER — Other Ambulatory Visit (HOSPITAL_COMMUNITY): Payer: Self-pay

## 2023-10-18 ENCOUNTER — Other Ambulatory Visit: Payer: Self-pay | Admitting: *Deleted

## 2023-10-18 MED ORDER — TRAZODONE HCL 50 MG PO TABS: 50.0000 mg | ORAL_TABLET | Freq: Every day | ORAL | 0 refills | Status: DC

## 2023-10-18 NOTE — Telephone Encounter (Signed)
 Last seen on 02/28/23 Follow up scheduled on 12/22/23

## 2023-10-20 DIAGNOSIS — N200 Calculus of kidney: Secondary | ICD-10-CM | POA: Diagnosis not present

## 2023-11-09 ENCOUNTER — Ambulatory Visit: Payer: Medicare Other | Attending: Family Medicine | Admitting: Audiologist

## 2023-11-09 DIAGNOSIS — H903 Sensorineural hearing loss, bilateral: Secondary | ICD-10-CM | POA: Insufficient documentation

## 2023-11-09 NOTE — Procedures (Signed)
  Outpatient Audiology and [facility] [address] [place], [place]  [address] [phone]  AUDIOLOGICAL  EVALUATION  NAME: Jocelyn Wilson     DOB:   Oct 18, 1950      MRN: [id number]                                                                                     DATE: 11/09/2023     REFERENT: Jarrett Soho, PA-C STATUS: Outpatient DIAGNOSIS: Sensorineural Hearing Loss Bilateral Hearing Loss    History: [name] was seen for an audiological evaluation due to difficulty hearing people clearly. [name] accompanied today by her daughter. She feels she can hear them, but not understand. She has difficulty hearing her husband. [name] has MS. She has diabetes which is well controled. She has no significant history of noise exposure.  [name] denies pain, pressure, or tinnitus.  [name] no history of hazardous noise exposure.  Medical history shows no additional risk for hearing loss.    Evaluation:  Otoscopy showed a clear view of the tympanic membranes, bilaterally Tympanometry results were consistent with normal middle ear function, bilaterally   Audiometric testing was completed using Conventional Audiometry techniques with insert earphones and supraural headphones. Test results are consistent with normal sloping steeply to severe sensorineural  hearing loss. Speech Recognition Thresholds were obtained at 40dB HL in the right ear and at 40dB HL in the left ear. Word Recognition Testing was completed at  40dB SL and [name] scored 100% in the right ear and 96% in the left ear.   Results:  The test results were reviewed with [name] and her daughter. [name] has a moderately severe high frequency hearing loss. [name] needs hearing aids. She was counseled on why hearing aids will help with clarity. Three audiogram copies printed and provided to [facility].    Recommendations: Hearing aids recommended for both ears. Patient given list of local hearing aid providers.  Annual  audiometric testing recommended to monitor hearing loss for progression.    32 minutes spent testing and counseling on results.   If you have any questions please feel free to contact me at [phone].  Ammie Ferrier Au.D.  Audiologist   11/09/2023  9:54 AM  Cc: Jarrett Soho, PA-C

## 2023-12-06 DIAGNOSIS — N200 Calculus of kidney: Secondary | ICD-10-CM | POA: Diagnosis not present

## 2023-12-14 ENCOUNTER — Other Ambulatory Visit (HOSPITAL_COMMUNITY): Payer: Self-pay

## 2023-12-14 MED ORDER — MOUNJARO 7.5 MG/0.5ML ~~LOC~~ SOAJ
7.5000 mg | SUBCUTANEOUS | 0 refills | Status: DC
Start: 2023-12-14 — End: 2024-03-14
  Filled 2023-12-14: qty 6, 84d supply, fill #0

## 2023-12-15 ENCOUNTER — Other Ambulatory Visit: Payer: Self-pay

## 2023-12-19 ENCOUNTER — Other Ambulatory Visit: Payer: Self-pay | Admitting: Neurology

## 2023-12-22 ENCOUNTER — Ambulatory Visit: Payer: Medicare Other | Admitting: Neurology

## 2023-12-22 ENCOUNTER — Encounter: Payer: Self-pay | Admitting: Neurology

## 2023-12-22 VITALS — BP 130/71 | HR 85 | Ht <= 58 in | Wt 195.0 lb

## 2023-12-22 DIAGNOSIS — M51369 Other intervertebral disc degeneration, lumbar region without mention of lumbar back pain or lower extremity pain: Secondary | ICD-10-CM | POA: Diagnosis not present

## 2023-12-22 DIAGNOSIS — M4326 Fusion of spine, lumbar region: Secondary | ICD-10-CM

## 2023-12-22 DIAGNOSIS — F418 Other specified anxiety disorders: Secondary | ICD-10-CM | POA: Diagnosis not present

## 2023-12-22 DIAGNOSIS — G35 Multiple sclerosis: Secondary | ICD-10-CM | POA: Diagnosis not present

## 2023-12-22 DIAGNOSIS — R269 Unspecified abnormalities of gait and mobility: Secondary | ICD-10-CM | POA: Diagnosis not present

## 2023-12-22 MED ORDER — BUSPIRONE HCL 30 MG PO TABS: 30.0000 mg | ORAL_TABLET | Freq: Three times a day (TID) | ORAL | 3 refills | Status: AC

## 2023-12-22 MED ORDER — TRAZODONE HCL 50 MG PO TABS: 50.0000 mg | ORAL_TABLET | Freq: Every day | ORAL | 3 refills | Status: AC

## 2023-12-22 MED ORDER — ROPINIROLE HCL 1 MG PO TABS: 1.0000 mg | ORAL_TABLET | Freq: Four times a day (QID) | ORAL | 3 refills | Status: AC

## 2023-12-22 MED ORDER — TRAMADOL HCL 50 MG PO TABS: 50.0000 mg | ORAL_TABLET | Freq: Three times a day (TID) | ORAL | 3 refills | Status: DC | PRN

## 2023-12-22 NOTE — Progress Notes (Signed)
 [facility]  PATIENT: Jocelyn Wilson DOB: 1951/09/23  REFERRING DOCTOR OR PCP:  Adaku Nnodi SOURCE: Patient  _________________________________   HISTORICAL  CHIEF COMPLAINT:  Chief Complaint  Patient presents with   Multiple Sclerosis    Rm 10 with spouse  Pt is well and stable, reports no new MS concerns.     HISTORY OF PRESENT ILLNESS:  Jocelyn Wilson is a 73 y.o. woman with multiple sclerosis, back pain (facet syndrome) s/p fusion, and migraines.  Update 12/12/2023 She has not been on a DMT since 2019 and MRI has not shown any activity.     No exacerbations  She has low back pain   Pain is axial without radiation.    She has surgery by  Dr. Jordan Likes and also has had RFA x 2 without benefit for the LBP at [facility].   Years ago, MBB/RFA had helped when I was in [place]   LBP did not improve much after surgery (Dr. Jordan Likes).   The surgery was Left L3L4 anterior lateral retroperitoneal interbody decompression. (Dr. Jordan Likes). In May 2021    Pain is worse if she stands a long time (like doing dishes).     She is taking tramadol one now and then but average < 1 / day. It makes her sleepy.   She did PT and gluteus massage has helped.       She has MS and has been fairly stable off of Betaseron (since 2019).   She was on Betaseron  > 10 years.   She had right ON in the past and currently has no sequelae (had minimal).   She has a low plaque burden and has had no new lesions x years.     She has reduced gait (likely combo of MS and LB issues),   She has urge incontinence.   Oxybutynin XL helped the bladder but caused dry mouth.      She has depression and anxiety, about the same as last visit.   .   She is on BuSpar, and Zoloft (200 mg daily)  She is on Mounjaro and has lost 13 pound in just  MS History:   Her MS was diagnosed around 2004.    She was actually seeing me for migraine headache when she presented with optic neuritis and vertigo and had another MRI.    She had interval development of multiple new foci and underwent a lumbar puncture.  CSF was also consistent with MS.   She was placed on Betaseron and continues on that medication with good control of the MS. No definite exacerbations.  Due to long-term debility, duration of her MS and her age, the decision was made to stop the Betaseron in 2019.  She has not had any exacerbations.  IMAGING 12/27/2020 MRI brain was unchanged compared to 2020.    11/03/2018 MRI of the lumbar spine shows that there has been significant changes at L3-L4 noted on the current MRI that have progressed compared to the 2017 MRI.  Specifically, she now has spinal stenosis at L3-L4 with an AP diameter of 8.2 mm.  That diameter was 10.8 mm in 2017.  Additionally, there is about 2 mm of anterolisthesis likely due to the facet hypertrophy that has progressed.  I believe it is playing some role in her pain.     11/03/2018 MRI of the brain shows that the MS is mild and stable.  She has a 12-millimeter extra-axial mass in the olfactory groove on  the right that was not present on MRI 11/09/2017.  It is hyperintense on T2 and FLAIR images and hypointense on T1-weighted images.  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  I am concerned about the rapid growth from not being visible to 12 mm in just 1 year.  I recommend that we repeat the MRI.   She has had an allergic reaction to contrast in the past so we would do the study without.  11/13/2018  Lumbar flex/ext  IMPRESSION: Mild dynamic instability at the L3-4 level, up to 5 mm anterolisthesis with patient standing in flexion. This is facet mediated, based on plain film appearance as well as review of MR.  Severe degenerative disease L4-5, near complete loss of interspace Height.  05/15/2019 MRI of the brain shows stable MS.  The extra-axial mass in the right olfactory groove (the study is also noncontrasted due to patient's intolerance of contrast in the past) actually appears a  little smaller than on the earlier MRI.  04/15/2015 MRI of the cervical spie showed no MS lesions and did have DJD.      REVIEW OF SYSTEMS: Constitutional: No fevers, chills, sweats, or change in appetite Eyes: No visual changes, double vision, eye pain Ear, nose and throat: No hearing loss, ear pain, nasal congestion, sore throat Cardiovascular: No chest pain, palpitations Respiratory:  No shortness of breath at rest or with exertion.   No wheezes GastrointestinaI: No nausea, vomiting, diarrhea, abdominal pain, fecal incontinence Genitourinary:  No dysuria, urinary retention or frequency.  No nocturia. Musculoskeletal:  No neck pain, back pain Integumentary: No rash, pruritus, skin lesions Neurological: as above Psychiatric: No depression at this time.  No anxiety Endocrine: No palpitations, diaphoresis, change in appetite, change in weigh or increased thirst Hematologic/Lymphatic:  No anemia, purpura, petechiae. Allergic/Immunologic: No itchy/runny eyes, nasal congestion, recent allergic reactions, rashes  ALLERGIES: Allergies  Allergen Reactions   Iron Anaphylaxis    Iv iron   Ciprofloxacin Nausea Only   Contrast Media [Iodinated Contrast Media] Itching and Rash   Latex Rash   Other Other (See Comments)    Raw apples. Cucumbers, hazelnuts, Raw vegetables, Raw fruits, except citrus     Sulfa Antibiotics Itching and Rash   Tape Rash    Adhesive tape    HOME MEDICATIONS:  Current Outpatient Medications:    atorvastatin (LIPITOR) 20 MG tablet, Take 20 mg by mouth at bedtime. , Disp: , Rfl:    Cyanocobalamin (VITAMIN B12) 1000 MCG TBCR, Take 1,000 mcg by mouth daily. , Disp: , Rfl:    esomeprazole (NEXIUM) 40 MG capsule, TAKE 1 CAPSULE BY MOUTH TWICE  DAILY, Disp: 180 capsule, Rfl: 3   ferrous sulfate 325 (65 FE) MG tablet, Take 325 mg by mouth daily. , Disp: , Rfl:    Ibuprofen 200 MG CAPS, Take 400 mg by mouth 3 (three) times daily as needed (headache or migraine). , Disp: ,  Rfl:    levothyroxine (SYNTHROID) 137 MCG tablet, Take 137 mcg by mouth daily before breakfast. , Disp: , Rfl:    metFORMIN (GLUCOPHAGE-XR) 500 MG 24 hr tablet, Take 1,000 mg by mouth 2 (two) times daily., Disp: , Rfl:    metoprolol succinate (TOPROL-XL) 50 MG 24 hr tablet, TAKE 1 TABLET BY MOUTH  DAILY (Patient taking differently: Take 50 mg by mouth daily.), Disp: 90 tablet, Rfl: 3   mupirocin ointment (BACTROBAN) 2 %, Apply 1 application externally 2 (two) times daily as needed to nose for 10 days, Disp:  22 g, Rfl: 0   ONE TOUCH ULTRA TEST test strip, USE TO CHECK FASTING AND PRE-DINNER BLOOD GLUCOSE BID, Disp: , Rfl: 12   ONETOUCH DELICA LANCETS 33G MISC, USE TO CHECK FASTING AND PRE-DINNER BLOOD GLUCOSE BID, Disp: , Rfl: 12   potassium citrate (UROCIT-K) 10 MEQ (1080 MG) SR tablet, Take 20 mEq by mouth 3 (three) times daily with meals. , Disp: , Rfl:    scopolamine (TRANSDERM-SCOP) 1 MG/3DAYS, Place 1 patch (1.5 mg total) onto the skin every 3 (three) days., Disp: 10 patch, Rfl: 11   sertraline (ZOLOFT) 100 MG tablet, TAKE 2 TABLETS BY MOUTH DAILY, Disp: 180 tablet, Rfl: 3   tirzepatide (MOUNJARO) 7.5 MG/0.5ML Pen, Inject 7.5 mg into the skin once a week., Disp: 2 mL, Rfl: 1   tirzepatide (MOUNJARO) 7.5 MG/0.5ML Pen, Inject 7.5 mg into the skin once a week., Disp: 6 mL, Rfl: 0   VITAMIN D PO, Take 5,000 Units by mouth daily. , Disp: , Rfl:    busPIRone (BUSPAR) 30 MG tablet, Take 1 tablet (30 mg total) by mouth 3 (three) times daily., Disp: 300 tablet, Rfl: 3   rOPINIRole (REQUIP) 1 MG tablet, Take 1 tablet (1 mg total) by mouth 4 (four) times daily., Disp: 400 tablet, Rfl: 3   traMADol (ULTRAM) 50 MG tablet, Take 1 tablet (50 mg total) by mouth 3 (three) times daily as needed., Disp: 100 tablet, Rfl: 3   traZODone (DESYREL) 50 MG tablet, Take 1 tablet (50 mg total) by mouth at bedtime., Disp: 100 tablet, Rfl: 3  PAST MEDICAL HISTORY: Past Medical History:  Diagnosis Date   Allergy     Anxiety    Arthritis    Asthma    does not use an inhaer- patient does not think she does   Back pain    Chronic kidney disease    KIDNEY STONES WITH STENTS   Depression    Diabetes (HCC)    type II   Diverticula of colon    Dyspnea    if climbing steps   Eczema    Food allergy    Gallbladder problem    GERD (gastroesophageal reflux disease)    History of kidney stones    Hypercholesterolemia    Hyperlipidemia    Hypothyroidism    Joint pain    Kidney stones    Leg edema    Migraine headache    with pressure change   Multiple sclerosis (HCC)    Obesity    Palpitations    PONV (postoperative nausea and vomiting)    with breast reduction surgery in [place] from the ~ 1980's/1990's   Restless leg syndrome    Right sided sciatica 10/29/2015   Snoring    Thyroid disease    hyperthyroidism   Unsteady gait    Vertigo    Vision abnormalities     PAST SURGICAL HISTORY: Past Surgical History:  Procedure Laterality Date   ANTERIOR LATERAL LUMBAR FUSION WITH PERCUTANEOUS SCREW 1 LEVEL Left 02/22/2020   Procedure: Anterior Lateral Lumbar Fusion - Lumbar Three-Lumbar Four with percutaneous pedicle screws;  Surgeon: Julio Sicks, MD;  Location: [facility] OR;  Service: Neurosurgery;  Laterality: Left;  Anterior Lateral Lumbar Fusion - Lumbar Three-Lumbar Four with percutaneous pedicle screws   BREAST SURGERY     AUGMENTATION   carpel tunnel Bilateral    CHOLECYSTECTOMY     DILATION AND CURETTAGE OF UTERUS  1983   ESOPHAGOGASTRODUODENOSCOPY     FINGER SURGERY  x8  trigger finger all except left pinky- right pinky didnt work   LITHOTRIPSY     NISSEN FUNDOPLICATION     VENTRAL HERNIA REPAIR  07/25/2012   Procedure: LAPAROSCOPIC VENTRAL HERNIA;  Surgeon: Ernestene Mention, MD;  Location: [facility] OR;  Service: General;  Laterality: N/A;    FAMILY HISTORY: Family History  Problem Relation Age of Onset   Hyperlipidemia Mother    Glaucoma Mother    Hypertension Mother    Anxiety disorder  Mother    Depression Mother    Heart disease Father    Cancer Father        prostate   Diabetes Father    Dementia Father    Hypertension Father    Hyperlipidemia Father    Sleep apnea Father    Obesity Father    Cancer Brother        esophageal   Arthritis Sister        RA    SOCIAL HISTORY:  Social History   Socioeconomic History   Marital status: Married    Spouse name: Maresha Anastos   Number of children: 1   Years of education: Not on file   Highest education level: Not on file  Occupational History   Occupation: retired  Tobacco Use   Smoking status: Former    Current packs/day: 0.00    Types: Cigarettes    Quit date: 1978    Years since quitting: 47.2   Smokeless tobacco: Never  Vaping Use   Vaping status: Never Used  Substance and Sexual Activity   Alcohol use: No   Drug use: No   Sexual activity: Not on file  Other Topics Concern   Not on file  Social History Narrative   Not on file   Social Drivers of Health   Financial Resource Strain: Not on file  Food Insecurity: Not on file  Transportation Needs: Not on file  Physical Activity: Not on file  Stress: Not on file  Social Connections: Not on file  Intimate Partner Violence: Not on file     PHYSICAL EXAM  Vitals:   12/22/23 1404  BP: 130/71  Pulse: 85  Weight: 195 lb (88.5 kg)  Height: 4\' 10"  (1.473 m)    Body mass index is 40.76 kg/m.   General: The patient is well-developed and well-nourished and in no acute distress  Musculoskeletal:  There is lower lumbar paraspinals tenderness.    She has reduced range of motion in the back.  Neurologic Exam  Mental status: The patient is alert and oriented x 3 at the time of the examination. The patient has apparent normal recent and remote memory.  Focus/attention appeared to be a little reduced today.Marland Kitchen   Speech is normal.  Cranial nerves: Extraocular movements are full.  Facial strength and sensation is normal.  Trapezius strength is  normal.  Hearing is symmetric.  Normal Rinne.   Weber does not lateralize.  Motor:  Muscle bulk is normal.   Tone is normal. Strength is  5 / 5 in all 4 extremities.   Sensory: Sensory testing is intact to soft touch.     Gait and station: Station is normal.  Her gait is arthritic and wide without walker but does better with it.  She is unable to do tandem walk.    Romberg negative  Reflexes: Deep tendon reflexes are symmetric and normal bilaterally.      ______________________________________________  Multiple sclerosis (HCC)  Gait disturbance  Degeneration of intervertebral disc of  lumbar region, unspecified whether pain present  Depression with anxiety  Fusion of lumbar spine   1.  Continue tramadol up to 3 times a day.  We will write the prescription as she no longer sees pain management.   2.   stay active and exercise as tolerated. 3.   Continue Mounjaro now and is hoping to lose more weight --- she has lost 18 poubds since last year 5.   Return in 6 months or sooner if there are new or worsening neurologic symptoms.   This visit is part of a comprehensive longitudinal care medical relationship regarding the patients primary diagnosis of MS and back pain and related concerns.  Pa Tennant A. Epimenio Foot, MD, PhD 12/22/2023, 2:23 PM Certified in Neurology, Clinical Neurophysiology, Sleep Medicine, Pain Medicine and Neuroimaging  [facility] [address], [address] [place], [place] [address] [phone]  ;,

## 2024-01-20 ENCOUNTER — Other Ambulatory Visit: Payer: Self-pay | Admitting: Neurology

## 2024-01-24 NOTE — Telephone Encounter (Signed)
 Last seen 12/22/23 Follow up scheduled on 07/31/24

## 2024-01-28 ENCOUNTER — Other Ambulatory Visit: Payer: Self-pay | Admitting: Neurology

## 2024-01-30 NOTE — Telephone Encounter (Signed)
 Last seen on 12/22/23 Follow up scheduled on 07/31/24   Requesting refill to soon   SERTRALINE  HYDROCHLORIDE  100 MG TABS 12/04/2023 90 180 tablet Sater, Sherida Dimmer, MD [facility], [place]

## 2024-02-16 DIAGNOSIS — M79675 Pain in left toe(s): Secondary | ICD-10-CM | POA: Diagnosis not present

## 2024-02-20 ENCOUNTER — Ambulatory Visit: Admitting: Podiatry

## 2024-02-20 DIAGNOSIS — B351 Tinea unguium: Secondary | ICD-10-CM | POA: Diagnosis not present

## 2024-02-20 DIAGNOSIS — M79674 Pain in right toe(s): Secondary | ICD-10-CM | POA: Diagnosis not present

## 2024-02-20 DIAGNOSIS — E1142 Type 2 diabetes mellitus with diabetic polyneuropathy: Secondary | ICD-10-CM

## 2024-02-20 DIAGNOSIS — M79675 Pain in left toe(s): Secondary | ICD-10-CM

## 2024-02-20 NOTE — Progress Notes (Signed)
  Subjective:  Patient ID: Jocelyn Wilson, female    DOB: 03/02/1951,   MRN: [id number]  No chief complaint on file.   73 y.o. female presents for concern of thickened elongated and painful nails that are difficult to trim. Requesting to have them trimmed today. Relates burning and tingling in their feet. Patient is diabetic and last A1c was  Lab Results  Component Value Date   HGBA1C 6.7 (H) 02/19/2020   .   PCP:  Darnelle Elders, PA-C    . Denies any other pedal complaints. Denies n/v/f/c.   Past Medical History:  Diagnosis Date   Allergy    Anxiety    Arthritis    Asthma    does not use an inhaer- patient does not think she does   Back pain    Chronic kidney disease    KIDNEY STONES WITH STENTS   Depression    Diabetes (HCC)    type II   Diverticula of colon    Dyspnea    if climbing steps   Eczema    Food allergy    Gallbladder problem    GERD (gastroesophageal reflux disease)    History of kidney stones    Hypercholesterolemia    Hyperlipidemia    Hypothyroidism    Joint pain    Kidney stones    Leg edema    Migraine headache    with pressure change   Multiple sclerosis (HCC)    Obesity    Palpitations    PONV (postoperative nausea and vomiting)    with breast reduction surgery in [place] from the ~ 1980's/1990's   Restless leg syndrome    Right sided sciatica 10/29/2015   Snoring    Thyroid  disease    hyperthyroidism   Unsteady gait    Vertigo    Vision abnormalities     Objective:  Physical Exam: Vascular: DP/PT pulses 2/4 bilateral. CFT <3 seconds. Absent hair growth on digits. Edema noted to bilateral lower extremities. Xerosis noted bilaterally.  Skin. No lacerations or abrasions bilateral feet. Nails 1-5 bilateral  are thickened discolored and elongated with subungual debris.  Musculoskeletal: MMT 5/5 bilateral lower extremities in DF, PF, Inversion and Eversion. Deceased ROM in DF of ankle joint.  Neurological: Sensation intact to light touch.  Protective sensation diminished bilateral.    Assessment:   1. Pain due to onychomycosis of toenails of both feet   2. Type 2 diabetes mellitus with diabetic polyneuropathy, without long-term current use of insulin  (HCC)      Plan:  Patient was evaluated and treated and all questions answered. -Discussed and educated patient on diabetic foot care, especially with  regards to the vascular, neurological and musculoskeletal systems.  -Stressed the importance of good glycemic control and the detriment of not  controlling glucose levels in relation to the foot. -Discussed supportive shoes at all times and checking feet regularly.  -Mechanically debrided all nails 1-5 bilateral using sterile nail nipper and filed with dremel without incident  -Answered all patient questions -Patient to return  in 3 months for at risk foot care -Patient advised to call the office if any problems or questions arise in the meantime.   Jennefer Moats, DPM

## 2024-02-22 DIAGNOSIS — Z1231 Encounter for screening mammogram for malignant neoplasm of breast: Secondary | ICD-10-CM | POA: Diagnosis not present

## 2024-02-26 ENCOUNTER — Encounter: Payer: Self-pay | Admitting: Podiatry

## 2024-02-27 ENCOUNTER — Ambulatory Visit: Admitting: Podiatry

## 2024-03-08 ENCOUNTER — Ambulatory Visit: Admitting: Podiatry

## 2024-03-08 ENCOUNTER — Encounter: Payer: Self-pay | Admitting: Podiatry

## 2024-03-08 DIAGNOSIS — L600 Ingrowing nail: Secondary | ICD-10-CM | POA: Diagnosis not present

## 2024-03-08 NOTE — Progress Notes (Signed)
 Subjective:  Patient ID: Jocelyn Wilson, female    DOB: Feb 24, 1951,   MRN: [id number]  Chief Complaint  Patient presents with   Diabetes    "The left big toenail that she treated me for before has come back.  It hurt me all last night."  Dr. Donita Furrow - 2 mos ago; A1c - 5.8    73 y.o. female presents for new concern of sensitivity and pain in her left great toe. States the toe has still been very painful. Would like to have it removed.  Patient is diabetic and last A1c was  Lab Results  Component Value Date   HGBA1C 6.7 (H) 02/19/2020   .   PCP:  Darnelle Elders, PA-C    . Denies any other pedal complaints. Denies n/v/f/c.   Past Medical History:  Diagnosis Date   Allergy    Anxiety    Arthritis    Asthma    does not use an inhaer- patient does not think she does   Back pain    Chronic kidney disease    KIDNEY STONES WITH STENTS   Depression    Diabetes (HCC)    type II   Diverticula of colon    Dyspnea    if climbing steps   Eczema    Food allergy    Gallbladder problem    GERD (gastroesophageal reflux disease)    History of kidney stones    Hypercholesterolemia    Hyperlipidemia    Hypothyroidism    Joint pain    Kidney stones    Leg edema    Migraine headache    with pressure change   Multiple sclerosis (HCC)    Obesity    Palpitations    PONV (postoperative nausea and vomiting)    with breast reduction surgery in [place] from the ~ 1980's/1990's   Restless leg syndrome    Right sided sciatica 10/29/2015   Snoring    Thyroid  disease    hyperthyroidism   Unsteady gait    Vertigo    Vision abnormalities     Objective:  Physical Exam: Vascular: DP/PT pulses 2/4 bilateral. CFT <3 seconds. Absent hair growth on digits. Edema noted to bilateral lower extremities. Xerosis noted bilaterally.  Skin. No lacerations or abrasions bilateral feet. Left hallux nail lateral border incurvated and tender to palpation. No erythema edema or purulence noted.   Musculoskeletal: MMT 5/5 bilateral lower extremities in DF, PF, Inversion and Eversion. Deceased ROM in DF of ankle joint.  Neurological: Sensation intact to light touch. Protective sensation diminished bilateral.    Assessment:   1. Ingrown left greater toenail       Plan:  Patient was evaluated and treated and all questions answered. Discussed ingrown toenails etiology and treatment options including procedure for removal vs conservative care.  Patient requesting removal of ingrown nail today. Procedure below.  Discussed procedure and post procedure care and patient expressed understanding.  Will follow-up in 2 weeks for nail check or sooner if any problems arise.    Procedure:  Procedure: partial Nail Avulsion of left hallux lateral nail border.  Surgeon: Jennefer Moats, DPM  Pre-op Dx: Ingrown toenail without infection Post-op: Same  Place of Surgery: Office exam room.  Indications for surgery: Painful and ingrown toenail.    The patient is requesting removal of nail with  chemical matrixectomy. Risks and complications were discussed with the patient for which they understand and written consent was obtained. Under sterile conditions a total of 3  mL of  1% lidocaine  plain was infiltrated in a hallux block fashion. Once anesthetized, the skin was prepped in sterile fashion. A tourniquet was then applied. Next the lateral aspect of hallux nail border was then sharply excised making sure to remove the entire offending nail border.  Next phenol was then applied under standard conditions to permanently destroy the matrix and copiously irrigated. Silvadene was applied. A dry sterile dressing was applied. After application of the dressing the tourniquet was removed and there is found to be an immediate capillary refill time to the digit. The patient tolerated the procedure well without any complications. Post procedure instructions were discussed the patient for which he verbally  understood. Follow-up in two weeks for nail check or sooner if any problems are to arise. Discussed signs/symptoms of infection and directed to call the office immediately should any occur or go directly to the emergency room. In the meantime, encouraged to call the office with any questions, concerns, changes symptoms.    Jennefer Moats, DPM

## 2024-03-08 NOTE — Patient Instructions (Signed)

## 2024-03-13 ENCOUNTER — Other Ambulatory Visit (HOSPITAL_BASED_OUTPATIENT_CLINIC_OR_DEPARTMENT_OTHER): Payer: Self-pay

## 2024-03-13 ENCOUNTER — Encounter: Payer: Self-pay | Admitting: Podiatry

## 2024-03-14 ENCOUNTER — Other Ambulatory Visit (HOSPITAL_BASED_OUTPATIENT_CLINIC_OR_DEPARTMENT_OTHER): Payer: Self-pay

## 2024-03-14 MED ORDER — MOUNJARO 7.5 MG/0.5ML ~~LOC~~ SOAJ
7.5000 mg | SUBCUTANEOUS | 0 refills | Status: DC
  Filled 2024-03-14: qty 6, 84d supply, fill #0

## 2024-03-26 ENCOUNTER — Encounter: Payer: Self-pay | Admitting: Podiatry

## 2024-03-26 ENCOUNTER — Ambulatory Visit: Admitting: Podiatry

## 2024-03-26 DIAGNOSIS — L600 Ingrowing nail: Secondary | ICD-10-CM

## 2024-03-26 DIAGNOSIS — E1142 Type 2 diabetes mellitus with diabetic polyneuropathy: Secondary | ICD-10-CM | POA: Diagnosis not present

## 2024-03-26 MED ORDER — CEPHALEXIN 500 MG PO CAPS
500.0000 mg | ORAL_CAPSULE | Freq: Three times a day (TID) | ORAL | 0 refills | Status: AC
Start: 2024-03-26 — End: 2024-03-31

## 2024-03-26 NOTE — Progress Notes (Signed)
  Subjective:  Patient ID: Jocelyn Wilson, female    DOB: [birth year],   MRN: [id number]  Chief Complaint  Patient presents with   Nail Problem    Rm8/ follow up nail removeal 2 week right hallux/sore swollen and red    73 y.o. female presents for follow-up of left ingrown nail procedure. Relates it has been swollen and red. Has been soaking as intrsucted.  . Denies any other pedal complaints. Denies n/v/f/c.   Past Medical History:  Diagnosis Date   Allergy    Anxiety    Arthritis    Asthma    does not use an inhaer- patient does not think she does   Back pain    Chronic kidney disease    KIDNEY STONES WITH STENTS   Depression    Diabetes (HCC)    type II   Diverticula of colon    Dyspnea    if climbing steps   Eczema    Food allergy    Gallbladder problem    GERD (gastroesophageal reflux disease)    History of kidney stones    Hypercholesterolemia    Hyperlipidemia    Hypothyroidism    Joint pain    Kidney stones    Leg edema    Migraine headache    with pressure change   Multiple sclerosis (HCC)    Obesity    Palpitations    PONV (postoperative nausea and vomiting)    with breast reduction surgery in [place] from the ~ 1980's/1990's   Restless leg syndrome    Right sided sciatica 10/29/2015   Snoring    Thyroid  disease    hyperthyroidism   Unsteady gait    Vertigo    Vision abnormalities     Objective:  Physical Exam: Vascular: DP/PT pulses 2/4 bilateral. CFT <3 seconds. Normal hair growth on digits. No edema.  Skin. No lacerations or abrasions bilateral feet. Left hallux nail does have some erythema and edema noted and some possible purulence. This appears mild but potential for some minor infection.  Musculoskeletal: MMT 5/5 bilateral lower extremities in DF, PF, Inversion and Eversion. Deceased ROM in DF of ankle joint.  Neurological: Sensation intact to light touch.   Assessment:   1. Ingrown left greater toenail   2. Type 2 diabetes mellitus with  diabetic polyneuropathy, without long-term current use of insulin  (HCC)      Plan:  Patient was evaluated and treated and all questions answered. Toe was evaluated and appears to be healing well. There is some swelling and erythema still prsent and some possible purulence.  Will start on keflex as precaution.  Continue soaks and neosporin.  Patient to follow-up in 2 weeks for additional recheck.    Jennefer Moats, DPM

## 2024-04-02 ENCOUNTER — Other Ambulatory Visit: Payer: Self-pay | Admitting: Neurology

## 2024-04-03 DIAGNOSIS — E1169 Type 2 diabetes mellitus with other specified complication: Secondary | ICD-10-CM | POA: Diagnosis not present

## 2024-04-03 DIAGNOSIS — M8580 Other specified disorders of bone density and structure, unspecified site: Secondary | ICD-10-CM | POA: Diagnosis not present

## 2024-04-03 DIAGNOSIS — I10 Essential (primary) hypertension: Secondary | ICD-10-CM | POA: Diagnosis not present

## 2024-04-03 DIAGNOSIS — E7800 Pure hypercholesterolemia, unspecified: Secondary | ICD-10-CM | POA: Diagnosis not present

## 2024-04-03 DIAGNOSIS — E039 Hypothyroidism, unspecified: Secondary | ICD-10-CM | POA: Diagnosis not present

## 2024-04-03 DIAGNOSIS — E559 Vitamin D deficiency, unspecified: Secondary | ICD-10-CM | POA: Diagnosis not present

## 2024-04-03 DIAGNOSIS — Z23 Encounter for immunization: Secondary | ICD-10-CM | POA: Diagnosis not present

## 2024-04-03 DIAGNOSIS — G35 Multiple sclerosis: Secondary | ICD-10-CM | POA: Diagnosis not present

## 2024-04-03 NOTE — Telephone Encounter (Signed)
 Last seen on 12/22/23 Follow up scheduled on 07/31/24  Rx can be filled by PCP.

## 2024-04-09 ENCOUNTER — Ambulatory Visit (INDEPENDENT_AMBULATORY_CARE_PROVIDER_SITE_OTHER): Admitting: Podiatry

## 2024-04-09 DIAGNOSIS — Z91199 Patient's noncompliance with other medical treatment and regimen due to unspecified reason: Secondary | ICD-10-CM

## 2024-04-09 NOTE — Progress Notes (Signed)
 No show

## 2024-04-19 DIAGNOSIS — M13862 Other specified arthritis, left knee: Secondary | ICD-10-CM | POA: Diagnosis not present

## 2024-04-19 DIAGNOSIS — M13861 Other specified arthritis, right knee: Secondary | ICD-10-CM | POA: Diagnosis not present

## 2024-05-08 ENCOUNTER — Other Ambulatory Visit: Payer: Self-pay | Admitting: Neurology

## 2024-05-08 NOTE — Telephone Encounter (Signed)
 Last seen on 12/22/23 Follow up scheduled 07/31/24

## 2024-05-16 ENCOUNTER — Other Ambulatory Visit: Payer: Self-pay

## 2024-05-16 ENCOUNTER — Telehealth: Payer: Self-pay | Admitting: Neurology

## 2024-05-16 MED ORDER — MECLIZINE HCL 25 MG PO TABS: ORAL_TABLET | ORAL | 0 refills | Status: AC

## 2024-05-16 NOTE — Telephone Encounter (Signed)
 Appears Dr. Vear last rx'd in 2023: take 1 tablet po TID prn for dizziness qty 270. Appears it was d/c'd marked pt preference

## 2024-05-16 NOTE — Telephone Encounter (Signed)
 Waiting to speak with MD to see if he is willing to refill

## 2024-05-16 NOTE — Telephone Encounter (Signed)
 Dr. Vear approved, sent to pt's pharmacy

## 2024-05-16 NOTE — Telephone Encounter (Signed)
 Patient said need to start taking Meclizine  HCl 25 MG for dizziness. Send to [facility]) a 90-day supply.

## 2024-05-18 ENCOUNTER — Other Ambulatory Visit (HOSPITAL_BASED_OUTPATIENT_CLINIC_OR_DEPARTMENT_OTHER): Payer: Self-pay

## 2024-05-22 ENCOUNTER — Other Ambulatory Visit (HOSPITAL_BASED_OUTPATIENT_CLINIC_OR_DEPARTMENT_OTHER): Payer: Self-pay

## 2024-05-22 DIAGNOSIS — E039 Hypothyroidism, unspecified: Secondary | ICD-10-CM | POA: Diagnosis not present

## 2024-05-28 ENCOUNTER — Ambulatory Visit (INDEPENDENT_AMBULATORY_CARE_PROVIDER_SITE_OTHER): Admitting: Podiatry

## 2024-05-28 ENCOUNTER — Encounter: Payer: Self-pay | Admitting: Podiatry

## 2024-05-28 DIAGNOSIS — M79675 Pain in left toe(s): Secondary | ICD-10-CM

## 2024-05-28 DIAGNOSIS — E1142 Type 2 diabetes mellitus with diabetic polyneuropathy: Secondary | ICD-10-CM

## 2024-05-28 DIAGNOSIS — B351 Tinea unguium: Secondary | ICD-10-CM

## 2024-05-28 DIAGNOSIS — M79674 Pain in right toe(s): Secondary | ICD-10-CM | POA: Diagnosis not present

## 2024-05-28 NOTE — Progress Notes (Signed)
  Subjective:  Patient ID: Jocelyn Wilson, female    DOB: Mar 30, 1951,   MRN: [id number]  Chief Complaint  Patient presents with   Diabetes    Clip my toenails.  Saw Charmaine Bright, PA-C - 04/09/2024; A1c - 5.7    73 y.o. female presents for concern of thickened elongated and painful nails that are difficult to trim. Requesting to have them trimmed today. Relates burning and tingling in their feet. Patient is diabetic and last A1c was  Lab Results  Component Value Date   HGBA1C 6.7 (H) 02/19/2020   .   PCP:  Bright Charmaine, PA-C    . Denies any other pedal complaints. Denies n/v/f/c.   Past Medical History:  Diagnosis Date   Allergy    Anxiety    Arthritis    Asthma    does not use an inhaer- patient does not think she does   Back pain    Chronic kidney disease    KIDNEY STONES WITH STENTS   Depression    Diabetes (HCC)    type II   Diverticula of colon    Dyspnea    if climbing steps   Eczema    Food allergy    Gallbladder problem    GERD (gastroesophageal reflux disease)    History of kidney stones    Hypercholesterolemia    Hyperlipidemia    Hypothyroidism    Joint pain    Kidney stones    Leg edema    Migraine headache    with pressure change   Multiple sclerosis (HCC)    Obesity    Palpitations    PONV (postoperative nausea and vomiting)    with breast reduction surgery in [place] from the ~ 1980's/1990's   Restless leg syndrome    Right sided sciatica 10/29/2015   Snoring    Thyroid  disease    hyperthyroidism   Unsteady gait    Vertigo    Vision abnormalities     Objective:  Physical Exam: Vascular: DP/PT pulses 2/4 bilateral. CFT <3 seconds. Absent hair growth on digits. Edema noted to bilateral lower extremities. Xerosis noted bilaterally.  Skin. No lacerations or abrasions bilateral feet. Nails 1-5 bilateral  are thickened discolored and elongated with subungual debris.  Musculoskeletal: MMT 5/5 bilateral lower extremities in DF, PF,  Inversion and Eversion. Deceased ROM in DF of ankle joint.  Neurological: Sensation intact to light touch. Protective sensation diminished bilateral.    Assessment:   1. Pain due to onychomycosis of toenails of both feet   2. Type 2 diabetes mellitus with diabetic polyneuropathy, without long-term current use of insulin  (HCC)      Plan:  Patient was evaluated and treated and all questions answered. -Discussed and educated patient on diabetic foot care, especially with  regards to the vascular, neurological and musculoskeletal systems.  -Stressed the importance of good glycemic control and the detriment of not  controlling glucose levels in relation to the foot. -Discussed supportive shoes at all times and checking feet regularly.  -Mechanically debrided all nails 1-5 bilateral using sterile nail nipper and filed with dremel without incident  -Answered all patient questions -Patient to return  in 3 months for at risk foot care -Patient advised to call the office if any problems or questions arise in the meantime.   Asberry Failing, DPM

## 2024-05-31 ENCOUNTER — Other Ambulatory Visit (HOSPITAL_BASED_OUTPATIENT_CLINIC_OR_DEPARTMENT_OTHER): Payer: Self-pay

## 2024-05-31 MED ORDER — MOUNJARO 10 MG/0.5ML ~~LOC~~ SOAJ
10.0000 mg | SUBCUTANEOUS | 0 refills | Status: DC
  Filled 2024-05-31: qty 6, 84d supply, fill #0

## 2024-06-01 ENCOUNTER — Other Ambulatory Visit (HOSPITAL_BASED_OUTPATIENT_CLINIC_OR_DEPARTMENT_OTHER): Payer: Self-pay

## 2024-06-04 ENCOUNTER — Other Ambulatory Visit: Payer: Self-pay

## 2024-06-04 ENCOUNTER — Other Ambulatory Visit (HOSPITAL_BASED_OUTPATIENT_CLINIC_OR_DEPARTMENT_OTHER): Payer: Self-pay

## 2024-06-04 MED ORDER — MOUNJARO 7.5 MG/0.5ML ~~LOC~~ SOAJ
7.5000 mg | SUBCUTANEOUS | 0 refills | Status: AC
  Filled 2024-06-04 – 2024-08-16 (×2): qty 6, 84d supply, fill #0

## 2024-06-05 ENCOUNTER — Other Ambulatory Visit (HOSPITAL_BASED_OUTPATIENT_CLINIC_OR_DEPARTMENT_OTHER): Payer: Self-pay

## 2024-06-15 ENCOUNTER — Other Ambulatory Visit (HOSPITAL_BASED_OUTPATIENT_CLINIC_OR_DEPARTMENT_OTHER): Payer: Self-pay

## 2024-06-19 DIAGNOSIS — M7989 Other specified soft tissue disorders: Secondary | ICD-10-CM | POA: Diagnosis not present

## 2024-06-19 DIAGNOSIS — I872 Venous insufficiency (chronic) (peripheral): Secondary | ICD-10-CM | POA: Diagnosis not present

## 2024-06-22 ENCOUNTER — Telehealth

## 2024-06-24 DIAGNOSIS — J209 Acute bronchitis, unspecified: Secondary | ICD-10-CM | POA: Diagnosis not present

## 2024-06-27 ENCOUNTER — Other Ambulatory Visit: Payer: Self-pay | Admitting: Neurology

## 2024-06-28 NOTE — Telephone Encounter (Signed)
 Last seen on 12/22/23 Follow up scheduled on 07/31/24 SERTRALINE  HYDROCHLORIDE  100 MG TABS 05/08/2024 90 180 tablet Sater, Charlie LABOR, MD [facility]    Too soon to refill  Nexium  can be prescribed by PCP.

## 2024-07-13 ENCOUNTER — Other Ambulatory Visit: Payer: Self-pay | Admitting: Neurology

## 2024-07-16 NOTE — Telephone Encounter (Signed)
 Last seen on 12/22/23 Follow up scheduled on 07/31/24   Dispensed Days Supply Quantity Provider Pharmacy  SERTRALINE  HYDROCHLORIDE  100 MG TABS 05/08/2024 90 180 tablet Sater, Charlie LABOR, MD [facility]    PCP can refill nexium .

## 2024-07-31 ENCOUNTER — Ambulatory Visit: Admitting: Neurology

## 2024-07-31 ENCOUNTER — Encounter: Payer: Self-pay | Admitting: Neurology

## 2024-07-31 VITALS — BP 125/79 | HR 90 | Ht <= 58 in | Wt 188.0 lb

## 2024-07-31 DIAGNOSIS — M51369 Other intervertebral disc degeneration, lumbar region without mention of lumbar back pain or lower extremity pain: Secondary | ICD-10-CM | POA: Diagnosis not present

## 2024-07-31 DIAGNOSIS — R269 Unspecified abnormalities of gait and mobility: Secondary | ICD-10-CM | POA: Diagnosis not present

## 2024-07-31 DIAGNOSIS — M4326 Fusion of spine, lumbar region: Secondary | ICD-10-CM | POA: Diagnosis not present

## 2024-07-31 DIAGNOSIS — G35D Multiple sclerosis, unspecified: Secondary | ICD-10-CM

## 2024-07-31 MED ORDER — TRAMADOL HCL 50 MG PO TABS: 50.0000 mg | ORAL_TABLET | Freq: Three times a day (TID) | ORAL | 3 refills | Status: AC | PRN

## 2024-07-31 MED ORDER — SERTRALINE HCL 100 MG PO TABS: 200.0000 mg | ORAL_TABLET | Freq: Every day | ORAL | 3 refills | Status: AC

## 2024-07-31 NOTE — Progress Notes (Signed)
 [facility]  PATIENT: Jocelyn Wilson DOB: 1951/03/24  REFERRING DOCTOR OR PCP:  Adaku Nnodi SOURCE: Patient  _________________________________   HISTORICAL  CHIEF COMPLAINT:  Chief Complaint  Patient presents with   RM10/MS    Pt is here with her Husband. Pt states she is stable.     HISTORY OF PRESENT ILLNESS:  Jocelyn Wilson is a 73 y.o. woman with multiple sclerosis, back pain (facet syndrome) s/p fusion, and migraines.  Update 07/31/2024 Her MS is stable with no recent exacerbation and no new neurologic symptoms.  She has not been on a DMT since 2019 and MRI has not shown any activity.   She has low back pain and known facet hypertrophy.    Pain is axial without radiation.    She has surgery by  Dr. Louis and also has had RFA x 2 without benefit for the LBP at [facility].   Years ago, MBB/RFA had helped when I was in [place] though it had not helped a couple times right before her surgery.      LBP did not improve much after surgery (Dr. Louis).   The surgery was Left L3L4 anterior lateral retroperitoneal interbody decompression. (Dr. Louis). In May 2021    Pain is worse if she stands a long time (like doing dishes or brushing teeth after 2 minutes)     She is taking tramadol  one now and then but averages  1 / day or less.  It makes her sleepy.   She did PT and gluteus massage has helped.       Her MS is mostly stable off of Betaseron  (since 2019).   She was on Betaseron   > 10 years.   She had right ON in the past and currently has no sequelae (had minimal).   She has a low plaque burden and has had no new lesions x years.     Gait is reduced.  Balance is sometimes poor and she uss a walker outside the house but sometimes grabs things in the home.     She has urge incontinence.   Oxybutynin  XL helped the bladder but caused dry mouth.      She has depression and anxiety, about the same as last visit.   She is on BuSpar , and Zoloft  (200 mg  daily)  She is sometimes dizzy lightheaded when she stands up after sitting a while or when she has a HA.  She has never had syncope.  Her daughter has POTS  She is on Mounjaro  and has lost 41 pound in last year and no s.e.     MS History:   Her MS was diagnosed around 2004.    She was actually seeing me for migraine headache when she presented with optic neuritis and vertigo and had another MRI.   She had interval development of multiple new foci and underwent a lumbar puncture.  CSF was also consistent with MS.   She was placed on Betaseron  and continues on that medication with good control of the MS. No definite exacerbations.  Due to long-term debility, duration of her MS and her age, the decision was made to stop the Betaseron  in 2019.  She has not had any exacerbations.  IMAGING 12/27/2020 MRI brain was unchanged compared to 2020.    11/03/2018 MRI of the lumbar spine shows that there has been significant changes at L3-L4 noted on the current MRI that have progressed compared to the 2017 MRI.  Specifically, she  now has spinal stenosis at L3-L4 with an AP diameter of 8.2 mm.  That diameter was 10.8 mm in 2017.  Additionally, there is about 2 mm of anterolisthesis likely due to the facet hypertrophy that has progressed.  I believe it is playing some role in her pain.     11/03/2018 MRI of the brain shows that the MS is mild and stable.  She has a 12-millimeter extra-axial mass in the olfactory groove on the right that was not present on MRI 11/09/2017.  It is hyperintense on T2 and FLAIR images and hypointense on T1-weighted images.  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  I am concerned about the rapid growth from not being visible to 12 mm in just 1 year.  I recommend that we repeat the MRI.   She has had an allergic reaction to contrast in the past so we would do the study without.  11/13/2018  Lumbar flex/ext  IMPRESSION: Mild dynamic instability at the L3-4 level, up to 5 mm  anterolisthesis with patient standing in flexion. This is facet mediated, based on plain film appearance as well as review of MR.  Severe degenerative disease L4-5, near complete loss of interspace Height.  05/15/2019 MRI of the brain shows stable MS.  The extra-axial mass in the right olfactory groove (the study is also noncontrasted due to patient's intolerance of contrast in the past) actually appears a little smaller than on the earlier MRI.  04/15/2015 MRI of the cervical spie showed no MS lesions and did have DJD.      REVIEW OF SYSTEMS: Constitutional: No fevers, chills, sweats, or change in appetite Eyes: No visual changes, double vision, eye pain Ear, nose and throat: No hearing loss, ear pain, nasal congestion, sore throat Cardiovascular: No chest pain, palpitations Respiratory:  No shortness of breath at rest or with exertion.   No wheezes GastrointestinaI: No nausea, vomiting, diarrhea, abdominal pain, fecal incontinence Genitourinary:  No dysuria, urinary retention or frequency.  No nocturia. Musculoskeletal:  No neck pain, back pain Integumentary: No rash, pruritus, skin lesions Neurological: as above Psychiatric: No depression at this time.  No anxiety Endocrine: No palpitations, diaphoresis, change in appetite, change in weigh or increased thirst Hematologic/Lymphatic:  No anemia, purpura, petechiae. Allergic/Immunologic: No itchy/runny eyes, nasal congestion, recent allergic reactions, rashes  ALLERGIES: Allergies  Allergen Reactions   Iron Anaphylaxis    Iv iron   Ciprofloxacin Nausea Only   Contrast Media [Iodinated Contrast Media] Itching and Rash   Latex Rash   Other Other (See Comments)    Raw apples. Cucumbers, hazelnuts, Raw vegetables, Raw fruits, except citrus     Sulfa Antibiotics Itching and Rash   Tape Rash    Adhesive tape    HOME MEDICATIONS:  Current Outpatient Medications:    atorvastatin  (LIPITOR) 20 MG tablet, Take 20 mg by mouth at bedtime. ,  Disp: , Rfl:    busPIRone  (BUSPAR ) 30 MG tablet, Take 1 tablet (30 mg total) by mouth 3 (three) times daily., Disp: 300 tablet, Rfl: 3   Cyanocobalamin (VITAMIN B12) 1000 MCG TBCR, Take 1,000 mcg by mouth daily. , Disp: , Rfl:    esomeprazole  (NEXIUM ) 40 MG capsule, TAKE 1 CAPSULE BY MOUTH TWICE  DAILY, Disp: 180 capsule, Rfl: 1   ferrous sulfate  325 (65 FE) MG tablet, Take 325 mg by mouth daily. , Disp: , Rfl:    Ibuprofen  200 MG CAPS, Take 400 mg by mouth 3 (three) times daily as needed (headache  or migraine). , Disp: , Rfl:    levothyroxine  (SYNTHROID ) 137 MCG tablet, Take 137 mcg by mouth daily before breakfast. , Disp: , Rfl:    meclizine  (ANTIVERT ) 25 MG tablet, TAKE 1 TABLET BY MOUTH 3  TIMES DAILY AS NEEDED FOR  DIZZINESS, Disp: 270 tablet, Rfl: 0   metFORMIN  (GLUCOPHAGE -XR) 500 MG 24 hr tablet, Take 1,000 mg by mouth 2 (two) times daily., Disp: , Rfl:    metoprolol  succinate (TOPROL -XL) 50 MG 24 hr tablet, TAKE 1 TABLET BY MOUTH  DAILY, Disp: 90 tablet, Rfl: 3   potassium citrate  (UROCIT-K ) 10 MEQ (1080 MG) SR tablet, Take 20 mEq by mouth 3 (three) times daily with meals. , Disp: , Rfl:    rOPINIRole  (REQUIP ) 1 MG tablet, Take 1 tablet (1 mg total) by mouth 4 (four) times daily., Disp: 400 tablet, Rfl: 3   tirzepatide  (MOUNJARO ) 10 MG/0.5ML Pen, Inject 10 mg into the skin every 7 (seven) days., Disp: 6 mL, Rfl: 0   traZODone  (DESYREL ) 50 MG tablet, Take 1 tablet (50 mg total) by mouth at bedtime., Disp: 100 tablet, Rfl: 3   VITAMIN D  PO, Take 5,000 Units by mouth daily. , Disp: , Rfl:    mupirocin  ointment (BACTROBAN ) 2 %, Apply 1 application externally 2 (two) times daily as needed to nose for 10 days (Patient not taking: Reported on 07/31/2024), Disp: 22 g, Rfl: 0   ONE TOUCH ULTRA TEST test strip, USE TO CHECK FASTING AND PRE-DINNER BLOOD GLUCOSE BID (Patient not taking: Reported on 07/31/2024), Disp: , Rfl: 12   ONETOUCH DELICA LANCETS 33G MISC, USE TO CHECK FASTING AND PRE-DINNER BLOOD  GLUCOSE BID (Patient not taking: Reported on 07/31/2024), Disp: , Rfl: 12   scopolamine  (TRANSDERM-SCOP) 1 MG/3DAYS, Place 1 patch (1.5 mg total) onto the skin every 3 (three) days. (Patient not taking: Reported on 07/31/2024), Disp: 10 patch, Rfl: 11   sertraline  (ZOLOFT ) 100 MG tablet, Take 2 tablets (200 mg total) by mouth daily., Disp: 180 tablet, Rfl: 3   tirzepatide  (MOUNJARO ) 7.5 MG/0.5ML Pen, Inject 7.5 mg into the skin once a week. (Patient not taking: Reported on 07/31/2024), Disp: 2 mL, Rfl: 1   tirzepatide  (MOUNJARO ) 7.5 MG/0.5ML Pen, Inject 7.5 mg into the skin once a week. (Patient not taking: Reported on 07/31/2024), Disp: 6 mL, Rfl: 0   traMADol  (ULTRAM ) 50 MG tablet, Take 1 tablet (50 mg total) by mouth 3 (three) times daily as needed., Disp: 100 tablet, Rfl: 3  PAST MEDICAL HISTORY: Past Medical History:  Diagnosis Date   Allergy    Anxiety    Arthritis    Asthma    does not use an inhaer- patient does not think she does   Back pain    Chronic kidney disease    KIDNEY STONES WITH STENTS   Depression    Diabetes (HCC)    type II   Diverticula of colon    Dyspnea    if climbing steps   Eczema    Food allergy    Gallbladder problem    GERD (gastroesophageal reflux disease)    History of kidney stones    Hypercholesterolemia    Hyperlipidemia    Hypothyroidism    Joint pain    Kidney stones    Leg edema    Migraine headache    with pressure change   Multiple sclerosis    Obesity    Palpitations    PONV (postoperative nausea and vomiting)    with breast  reduction surgery in [place] from the ~ 1980's/1990's   Restless leg syndrome    Right sided sciatica 10/29/2015   Snoring    Thyroid  disease    hyperthyroidism   Unsteady gait    Vertigo    Vision abnormalities     PAST SURGICAL HISTORY: Past Surgical History:  Procedure Laterality Date   ANTERIOR LATERAL LUMBAR FUSION WITH PERCUTANEOUS SCREW 1 LEVEL Left 02/22/2020   Procedure: Anterior Lateral Lumbar  Fusion - Lumbar Three-Lumbar Four with percutaneous pedicle screws;  Surgeon: Louis Shove, MD;  Location: [facility] OR;  Service: Neurosurgery;  Laterality: Left;  Anterior Lateral Lumbar Fusion - Lumbar Three-Lumbar Four with percutaneous pedicle screws   BREAST SURGERY     AUGMENTATION   carpel tunnel Bilateral    CHOLECYSTECTOMY     DILATION AND CURETTAGE OF UTERUS  1983   ESOPHAGOGASTRODUODENOSCOPY     FINGER SURGERY  x8    trigger finger all except left pinky- right pinky didnt work   LITHOTRIPSY     NISSEN FUNDOPLICATION     VENTRAL HERNIA REPAIR  07/25/2012   Procedure: LAPAROSCOPIC VENTRAL HERNIA;  Surgeon: Elon CHRISTELLA Pacini, MD;  Location: [facility] OR;  Service: General;  Laterality: N/A;    FAMILY HISTORY: Family History  Problem Relation Age of Onset   Hyperlipidemia Mother    Glaucoma Mother    Hypertension Mother    Anxiety disorder Mother    Depression Mother    Heart disease Father    Cancer Father        prostate   Diabetes Father    Dementia Father    Hypertension Father    Hyperlipidemia Father    Sleep apnea Father    Obesity Father    Cancer Brother        esophageal   Arthritis Sister        RA    SOCIAL HISTORY:  Social History   Socioeconomic History   Marital status: Married    Spouse name: Monike Bragdon   Number of children: 1   Years of education: Not on file   Highest education level: Not on file  Occupational History   Occupation: retired  Tobacco Use   Smoking status: Former    Current packs/day: 0.00    Types: Cigarettes    Quit date: 1978    Years since quitting: 47.8   Smokeless tobacco: Never  Vaping Use   Vaping status: Never Used  Substance and Sexual Activity   Alcohol use: No   Drug use: No   Sexual activity: Not on file  Other Topics Concern   Not on file  Social History Narrative   Not on file   Social Drivers of Health   Financial Resource Strain: Not on file  Food Insecurity: Not on file  Transportation Needs: Not on  file  Physical Activity: Not on file  Stress: Not on file  Social Connections: Not on file  Intimate Partner Violence: Not on file     PHYSICAL EXAM  Vitals:   07/31/24 1305  BP: 125/79  Pulse: 90  SpO2: 99%  Weight: 188 lb (85.3 kg)  Height: 4' 10 (1.473 m)    Body mass index is 39.29 kg/m.   General: The patient is well-developed and well-nourished and in no acute distress  Musculoskeletal:  There is lower lumbar paraspinals tenderness.    She has reduced range of motion in the back.  Neurologic Exam  Mental status: The patient is alert and oriented  x 3 at the time of the examination. The patient has apparent normal recent and remote memory.  Focus/attention appeared to be a little reduced today.SABRA   Speech is normal.  Cranial nerves: Extraocular movements are full.  Facial strength and sensation is normal.  Trapezius strength is normal.  Hearing is symmetric.  Normal Rinne.   Weber does not lateralize.  Motor:  Muscle bulk is normal.   Tone is normal. Strength is  5 / 5 in all 4 extremities.   Sensory: Sensory testing is intact to soft touch.     Gait and station: Station is normal.  She has an arthritic gait.  She is able to walk in the room without her walker but walks faster with it..  She is unable to do tandem walk.  The Romberg is negative.  Reflexes: Deep tendon reflexes are symmetric and normal bilaterally.      ______________________________________________  Multiple sclerosis  Gait disturbance  Degeneration of intervertebral disc of lumbar region, unspecified whether pain present  Fusion of lumbar spine   1.   To help with pain control, continue tramadol  up to 3 times a day.  Most days, she uses just 1.   2.   stay active and exercise as tolerated using exercises learned in PT.  3.   Continue Mounjaro  now and try to lose more weight as it would help her gait and LBP.    5.   Return in 6 months or sooner if there are new or worsening neurologic  symptoms.   This visit is part of a comprehensive longitudinal care medical relationship regarding the patients primary diagnosis of MS and back pain and related concerns.  Jocelyn Lowdermilk A. Vear, MD, PhD 07/31/2024, 2:44 PM Certified in Neurology, Clinical Neurophysiology, Sleep Medicine, Pain Medicine and Neuroimaging  [facility] [address] [place], [place] [address] [phone]  ;,

## 2024-08-06 DIAGNOSIS — H25013 Cortical age-related cataract, bilateral: Secondary | ICD-10-CM | POA: Diagnosis not present

## 2024-08-06 DIAGNOSIS — E119 Type 2 diabetes mellitus without complications: Secondary | ICD-10-CM | POA: Diagnosis not present

## 2024-08-06 DIAGNOSIS — H2513 Age-related nuclear cataract, bilateral: Secondary | ICD-10-CM | POA: Diagnosis not present

## 2024-08-06 DIAGNOSIS — H35371 Puckering of macula, right eye: Secondary | ICD-10-CM | POA: Diagnosis not present

## 2024-08-06 DIAGNOSIS — H5203 Hypermetropia, bilateral: Secondary | ICD-10-CM | POA: Diagnosis not present

## 2024-08-16 ENCOUNTER — Other Ambulatory Visit (HOSPITAL_BASED_OUTPATIENT_CLINIC_OR_DEPARTMENT_OTHER): Payer: Self-pay

## 2024-08-20 ENCOUNTER — Other Ambulatory Visit (HOSPITAL_BASED_OUTPATIENT_CLINIC_OR_DEPARTMENT_OTHER): Payer: Self-pay

## 2024-08-20 MED ORDER — MOUNJARO 10 MG/0.5ML ~~LOC~~ SOAJ
10.0000 mg | SUBCUTANEOUS | 0 refills | Status: DC
  Filled 2024-08-20: qty 6, 84d supply, fill #0

## 2024-08-21 ENCOUNTER — Other Ambulatory Visit (HOSPITAL_BASED_OUTPATIENT_CLINIC_OR_DEPARTMENT_OTHER): Payer: Self-pay

## 2024-08-21 MED ORDER — MOUNJARO 10 MG/0.5ML ~~LOC~~ SOAJ
10.0000 mg | SUBCUTANEOUS | 0 refills | Status: AC
  Filled 2024-08-21 – 2024-10-09 (×2): qty 6, 84d supply, fill #0

## 2024-08-29 ENCOUNTER — Ambulatory Visit: Admitting: Podiatry

## 2024-09-05 ENCOUNTER — Encounter: Payer: Self-pay | Admitting: *Deleted

## 2024-09-05 NOTE — Progress Notes (Signed)
 Jocelyn Wilson                                          MRN: [id number]   09/05/2024   The [facility] Quality Team Specialist reviewed this patient medical record for the purposes of chart review for care gap closure. The following were reviewed: chart review for care gap closure-kidney health evaluation for diabetes:eGFR  and uACR.    [facility] Quality Team

## 2024-09-13 ENCOUNTER — Other Ambulatory Visit: Payer: Self-pay | Admitting: Neurology

## 2024-10-01 NOTE — Telephone Encounter (Signed)
 Pt called following up on when this will be filled for her. Pt is requesting a 90 day qt.

## 2024-10-09 ENCOUNTER — Other Ambulatory Visit (HOSPITAL_BASED_OUTPATIENT_CLINIC_OR_DEPARTMENT_OTHER): Payer: Self-pay

## 2024-10-10 NOTE — Telephone Encounter (Signed)
 Pt called to follow up about medication Pt has not received any  Medicaine  and wanted to know what was going on esomeprazole  (NEXIUM ) 40 MG capsule  [facility] - [place], [place] - [address] Phone: [phone]  Fax: [phone]

## 2025-03-05 ENCOUNTER — Ambulatory Visit: Admitting: Neurology
